# Patient Record
Sex: Male | Born: 1950 | Race: White | Hispanic: No | Smoking: Current some day smoker
Health system: Southern US, Community
[De-identification: ages and names within clinical notes are randomized; demographics above are authoritative.]

## PROBLEM LIST (undated history)

## (undated) DIAGNOSIS — K7460 Unspecified cirrhosis of liver: Secondary | ICD-10-CM

## (undated) DIAGNOSIS — I10 Essential (primary) hypertension: Secondary | ICD-10-CM

## (undated) DIAGNOSIS — N289 Disorder of kidney and ureter, unspecified: Secondary | ICD-10-CM

## (undated) DIAGNOSIS — L409 Psoriasis, unspecified: Secondary | ICD-10-CM

## (undated) HISTORY — PX: HERNIA REPAIR: SHX51

## (undated) HISTORY — PX: UMBILICAL HERNIA REPAIR: SHX196

## (undated) HISTORY — PX: PARACENTESIS: SHX844

---

## 2006-12-14 ENCOUNTER — Inpatient Hospital Stay (HOSPITAL_COMMUNITY): Admission: EM | Admit: 2006-12-14 | Discharge: 2006-12-18 | Payer: Self-pay | Admitting: Emergency Medicine

## 2016-02-28 ENCOUNTER — Emergency Department (HOSPITAL_COMMUNITY): Payer: Non-veteran care

## 2016-02-28 ENCOUNTER — Inpatient Hospital Stay (HOSPITAL_COMMUNITY): Payer: Non-veteran care

## 2016-02-28 ENCOUNTER — Inpatient Hospital Stay (HOSPITAL_COMMUNITY)
Admission: EM | Admit: 2016-02-28 | Discharge: 2016-03-03 | DRG: 552 | Disposition: A | Discharge status: Home / Self Care | Payer: Non-veteran care | Attending: General Surgery | Admitting: General Surgery

## 2016-02-28 ENCOUNTER — Encounter (HOSPITAL_COMMUNITY): Payer: Self-pay | Admitting: Radiology

## 2016-02-28 DIAGNOSIS — Y908 Blood alcohol level of 240 mg/100 ml or more: Secondary | ICD-10-CM | POA: Diagnosis present

## 2016-02-28 DIAGNOSIS — Y92410 Unspecified street and highway as the place of occurrence of the external cause: Secondary | ICD-10-CM

## 2016-02-28 DIAGNOSIS — F10229 Alcohol dependence with intoxication, unspecified: Secondary | ICD-10-CM | POA: Diagnosis present

## 2016-02-28 DIAGNOSIS — S0282XA Fracture of other specified skull and facial bones, left side, initial encounter for closed fracture: Secondary | ICD-10-CM | POA: Diagnosis present

## 2016-02-28 DIAGNOSIS — I10 Essential (primary) hypertension: Secondary | ICD-10-CM | POA: Diagnosis present

## 2016-02-28 DIAGNOSIS — S12600A Unspecified displaced fracture of seventh cervical vertebra, initial encounter for closed fracture: Secondary | ICD-10-CM | POA: Diagnosis present

## 2016-02-28 DIAGNOSIS — F1010 Alcohol abuse, uncomplicated: Secondary | ICD-10-CM | POA: Diagnosis present

## 2016-02-28 DIAGNOSIS — S42192A Fracture of other part of scapula, left shoulder, initial encounter for closed fracture: Secondary | ICD-10-CM | POA: Diagnosis present

## 2016-02-28 DIAGNOSIS — S129XXA Fracture of neck, unspecified, initial encounter: Secondary | ICD-10-CM | POA: Diagnosis present

## 2016-02-28 DIAGNOSIS — E119 Type 2 diabetes mellitus without complications: Secondary | ICD-10-CM | POA: Diagnosis present

## 2016-02-28 DIAGNOSIS — S42102A Fracture of unspecified part of scapula, left shoulder, initial encounter for closed fracture: Secondary | ICD-10-CM | POA: Diagnosis present

## 2016-02-28 DIAGNOSIS — K766 Portal hypertension: Secondary | ICD-10-CM | POA: Diagnosis present

## 2016-02-28 DIAGNOSIS — K7030 Alcoholic cirrhosis of liver without ascites: Secondary | ICD-10-CM | POA: Diagnosis present

## 2016-02-28 DIAGNOSIS — S0292XA Unspecified fracture of facial bones, initial encounter for closed fracture: Secondary | ICD-10-CM | POA: Diagnosis present

## 2016-02-28 DIAGNOSIS — S0181XA Laceration without foreign body of other part of head, initial encounter: Secondary | ICD-10-CM | POA: Diagnosis present

## 2016-02-28 DIAGNOSIS — Z720 Tobacco use: Secondary | ICD-10-CM

## 2016-02-28 DIAGNOSIS — M542 Cervicalgia: Secondary | ICD-10-CM | POA: Diagnosis present

## 2016-02-28 DIAGNOSIS — T07XXXA Unspecified multiple injuries, initial encounter: Secondary | ICD-10-CM

## 2016-02-28 DIAGNOSIS — K7460 Unspecified cirrhosis of liver: Secondary | ICD-10-CM | POA: Diagnosis present

## 2016-02-28 DIAGNOSIS — S01112A Laceration without foreign body of left eyelid and periocular area, initial encounter: Secondary | ICD-10-CM | POA: Diagnosis present

## 2016-02-28 DIAGNOSIS — S2220XA Unspecified fracture of sternum, initial encounter for closed fracture: Secondary | ICD-10-CM | POA: Diagnosis present

## 2016-02-28 LAB — CBC
HEMATOCRIT: 33.2 % — AB (ref 39.0–52.0)
HEMOGLOBIN: 11.2 g/dL — AB (ref 13.0–17.0)
MCH: 34.5 pg — ABNORMAL HIGH (ref 26.0–34.0)
MCHC: 33.7 g/dL (ref 30.0–36.0)
MCV: 102.2 fL — ABNORMAL HIGH (ref 78.0–100.0)
Platelets: 167 10*3/uL (ref 150–400)
RBC: 3.25 MIL/uL — AB (ref 4.22–5.81)
RDW: 15 % (ref 11.5–15.5)
WBC: 9.1 10*3/uL (ref 4.0–10.5)

## 2016-02-28 LAB — COMPREHENSIVE METABOLIC PANEL
ALK PHOS: 112 U/L (ref 38–126)
ALT: 29 U/L (ref 17–63)
AST: 63 U/L — AB (ref 15–41)
Albumin: 2.7 g/dL — ABNORMAL LOW (ref 3.5–5.0)
Anion gap: 10 (ref 5–15)
CALCIUM: 8.3 mg/dL — AB (ref 8.9–10.3)
CHLORIDE: 109 mmol/L (ref 101–111)
CO2: 23 mmol/L (ref 22–32)
CREATININE: 0.81 mg/dL (ref 0.61–1.24)
Glucose, Bld: 177 mg/dL — ABNORMAL HIGH (ref 65–99)
Potassium: 3.9 mmol/L (ref 3.5–5.1)
Sodium: 142 mmol/L (ref 135–145)
Total Bilirubin: 2.1 mg/dL — ABNORMAL HIGH (ref 0.3–1.2)
Total Protein: 7.2 g/dL (ref 6.5–8.1)

## 2016-02-28 LAB — CBG MONITORING, ED: GLUCOSE-CAPILLARY: 149 mg/dL — AB (ref 65–99)

## 2016-02-28 LAB — CDS SEROLOGY

## 2016-02-28 LAB — ETHANOL: Alcohol, Ethyl (B): 272 mg/dL — ABNORMAL HIGH (ref <5)

## 2016-02-28 LAB — PROTIME-INR
INR: 1.5 — AB (ref 0.00–1.49)
PROTHROMBIN TIME: 18.2 s — AB (ref 11.6–15.2)

## 2016-02-28 LAB — SAMPLE TO BLOOD BANK

## 2016-02-28 MED ORDER — LIDOCAINE-EPINEPHRINE (PF) 2 %-1:200000 IJ SOLN
20.0000 mL | Freq: Once | INTRAMUSCULAR | Status: AC
  Administered 2016-02-28: 20 mL
  Filled 2016-02-28: qty 20

## 2016-02-28 MED ORDER — IOHEXOL 300 MG/ML  SOLN
100.0000 mL | Freq: Once | INTRAMUSCULAR | Status: AC | PRN
  Administered 2016-02-28: 50 mL via INTRAVENOUS

## 2016-02-28 MED ORDER — HYDROMORPHONE HCL 1 MG/ML IJ SOLN
1.0000 mg | Freq: Once | INTRAMUSCULAR | Status: DC
  Filled 2016-02-28: qty 1
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
## 2016-02-28 NOTE — ED Notes (Signed)
Portable chest and c-spine

## 2016-02-28 NOTE — ED Notes (Signed)
To c-t now  C/o pain everwhere.  But is refusing the pain med

## 2016-02-28 NOTE — ED Notes (Signed)
Dr Dwain Sarn[place]field here to see

## 2016-02-28 NOTE — ED Notes (Signed)
Family at the bedside.

## 2016-02-28 NOTE — ED Notes (Addendum)
Dr  Leta Baptistthimmappa here to suture the pt
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
## 2016-02-28 NOTE — ED Notes (Signed)
Pt placed on nasal 02 at 2
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
## 2016-02-28 NOTE — Progress Notes (Signed)
Orthopedic Tech Progress Note Patient Details:  Derrick RossBarry K Oneill 17-Mar-1951 [id number] Ortho visit and assist with orthopedic exam. Patient ID: Derrick RossBarry K Oneill, male   DOB: 17-Mar-1951, 65 y.o.   MRN: [id number]   Lesle ChrisGilliland, Jina Olenick L 02/28/2016, 6:08 PM

## 2016-02-28 NOTE — ED Notes (Signed)
Blood sugar -149

## 2016-02-29 ENCOUNTER — Encounter (HOSPITAL_COMMUNITY): Payer: Self-pay | Admitting: *Deleted

## 2016-02-29 LAB — COMPREHENSIVE METABOLIC PANEL
ALBUMIN: 2.6 g/dL — AB (ref 3.5–5.0)
ALK PHOS: 102 U/L (ref 38–126)
ALT: 32 U/L (ref 17–63)
AST: 59 U/L — AB (ref 15–41)
Anion gap: 9 (ref 5–15)
BILIRUBIN TOTAL: 3.1 mg/dL — AB (ref 0.3–1.2)
CALCIUM: 8.4 mg/dL — AB (ref 8.9–10.3)
CO2: 23 mmol/L (ref 22–32)
CREATININE: 0.63 mg/dL (ref 0.61–1.24)
Chloride: 109 mmol/L (ref 101–111)
GFR calc Af Amer: >60 mL/min (ref >60)
GLUCOSE: 108 mg/dL — AB (ref 65–99)
POTASSIUM: 4.2 mmol/L (ref 3.5–5.1)
Sodium: 141 mmol/L (ref 135–145)
TOTAL PROTEIN: 6.9 g/dL (ref 6.5–8.1)

## 2016-02-29 LAB — GLUCOSE, CAPILLARY
Glucose-Capillary: 105 mg/dL — ABNORMAL HIGH (ref 65–99)
Glucose-Capillary: 107 mg/dL — ABNORMAL HIGH (ref 65–99)

## 2016-02-29 LAB — PROTIME-INR
INR: 1.3 (ref 0.00–1.49)
PROTHROMBIN TIME: 16.3 s — AB (ref 11.6–15.2)

## 2016-02-29 LAB — CBC
HEMATOCRIT: 32.8 % — AB (ref 39.0–52.0)
HEMOGLOBIN: 11 g/dL — AB (ref 13.0–17.0)
MCH: 34 pg (ref 26.0–34.0)
MCHC: 33.5 g/dL (ref 30.0–36.0)
MCV: 101.2 fL — AB (ref 78.0–100.0)
Platelets: 171 10*3/uL (ref 150–400)
RBC: 3.24 MIL/uL — ABNORMAL LOW (ref 4.22–5.81)
RDW: 15.2 % (ref 11.5–15.5)
WBC: 8.8 10*3/uL (ref 4.0–10.5)

## 2016-02-29 LAB — AMMONIA: Ammonia: 60 umol/L — ABNORMAL HIGH (ref 9–35)

## 2016-02-29 LAB — MRSA PCR SCREENING: MRSA by PCR: NEGATIVE

## 2016-02-29 LAB — CBG MONITORING, ED
GLUCOSE-CAPILLARY: 91 mg/dL (ref 65–99)
Glucose-Capillary: 137 mg/dL — ABNORMAL HIGH (ref 65–99)

## 2016-02-29 MED ORDER — SPIRONOLACTONE 25 MG PO TABS
25.0000 mg | ORAL_TABLET | Freq: Every day | ORAL | Status: DC
  Administered 2016-02-29: 25 mg via ORAL
  Filled 2016-02-29: qty 1

## 2016-02-29 MED ORDER — ONDANSETRON HCL 4 MG/2ML IJ SOLN: 4.0000 mg | Freq: Four times a day (QID) | INTRAMUSCULAR | Status: DC | PRN

## 2016-02-29 MED ORDER — ENOXAPARIN SODIUM 30 MG/0.3ML ~~LOC~~ SOLN
30.0000 mg | Freq: Two times a day (BID) | SUBCUTANEOUS | Status: DC
  Administered 2016-02-29 – 2016-03-03 (×6): 30 mg via SUBCUTANEOUS
  Filled 2016-02-29 (×7): qty 0.3

## 2016-02-29 MED ORDER — PANTOPRAZOLE SODIUM 40 MG IV SOLR: 40.0000 mg | Freq: Every day | INTRAVENOUS | Status: DC

## 2016-02-29 MED ORDER — THIAMINE HCL 100 MG/ML IJ SOLN: 100.0000 mg | Freq: Every day | INTRAMUSCULAR | Status: DC

## 2016-02-29 MED ORDER — FUROSEMIDE 40 MG PO TABS
20.0000 mg | ORAL_TABLET | Freq: Every day | ORAL | Status: DC
  Administered 2016-02-29: 20 mg via ORAL
  Filled 2016-02-29: qty 1

## 2016-02-29 MED ORDER — CEFAZOLIN SODIUM 1-5 GM-% IV SOLN
1.0000 g | Freq: Three times a day (TID) | INTRAVENOUS | Status: AC
  Administered 2016-02-29 (×3): 1 g via INTRAVENOUS
  Filled 2016-02-29 (×3): qty 50

## 2016-02-29 MED ORDER — ADULT MULTIVITAMIN W/MINERALS CH
1.0000 | ORAL_TABLET | Freq: Every day | ORAL | Status: DC
  Administered 2016-02-29 – 2016-03-03 (×4): 1 via ORAL
  Filled 2016-02-29 (×4): qty 1

## 2016-02-29 MED ORDER — SODIUM CHLORIDE 0.9 % IV SOLN
INTRAVENOUS | Status: DC
  Administered 2016-02-29: 07:00:00 via INTRAVENOUS
  Administered 2016-03-01: 10 mL/h via INTRAVENOUS

## 2016-02-29 MED ORDER — HYDROMORPHONE HCL 1 MG/ML IJ SOLN
1.0000 mg | Freq: Once | INTRAMUSCULAR | Status: AC
  Administered 2016-02-29: 1 mg via INTRAVENOUS
  Filled 2016-02-29: qty 1

## 2016-02-29 MED ORDER — LORAZEPAM 2 MG/ML IJ SOLN
0.0000 mg | Freq: Four times a day (QID) | INTRAMUSCULAR | Status: AC
  Administered 2016-03-01: 2 mg via INTRAVENOUS
  Filled 2016-02-29: qty 1

## 2016-02-29 MED ORDER — LORAZEPAM 2 MG/ML IJ SOLN: 1.0000 mg | Freq: Four times a day (QID) | INTRAMUSCULAR | Status: DC | PRN

## 2016-02-29 MED ORDER — VITAMIN B-1 100 MG PO TABS
100.0000 mg | ORAL_TABLET | Freq: Every day | ORAL | Status: DC
  Administered 2016-02-29 – 2016-03-03 (×4): 100 mg via ORAL
  Filled 2016-02-29 (×4): qty 1

## 2016-02-29 MED ORDER — ONDANSETRON HCL 4 MG PO TABS: 4.0000 mg | ORAL_TABLET | Freq: Four times a day (QID) | ORAL | Status: DC | PRN

## 2016-02-29 MED ORDER — INSULIN ASPART 100 UNIT/ML ~~LOC~~ SOLN
0.0000 [IU] | Freq: Three times a day (TID) | SUBCUTANEOUS | Status: DC
  Administered 2016-03-01: 2 [IU] via SUBCUTANEOUS
  Administered 2016-03-01 – 2016-03-02 (×2): 1 [IU] via SUBCUTANEOUS

## 2016-02-29 MED ORDER — FOLIC ACID 1 MG PO TABS
1.0000 mg | ORAL_TABLET | Freq: Every day | ORAL | Status: DC
  Administered 2016-02-29 – 2016-03-03 (×4): 1 mg via ORAL
  Filled 2016-02-29 (×4): qty 1

## 2016-02-29 MED ORDER — DEXTROSE 5 % IV SOLN
500.0000 mg | Freq: Three times a day (TID) | INTRAVENOUS | Status: DC | PRN
  Administered 2016-02-29: 500 mg via INTRAVENOUS
  Filled 2016-02-29 (×3): qty 5

## 2016-02-29 MED ORDER — LORAZEPAM 1 MG PO TABS: 1.0000 mg | ORAL_TABLET | Freq: Four times a day (QID) | ORAL | Status: DC | PRN

## 2016-02-29 MED ORDER — BISACODYL 10 MG RE SUPP: 10.0000 mg | Freq: Every day | RECTAL | Status: DC | PRN

## 2016-02-29 MED ORDER — HYDROMORPHONE HCL 1 MG/ML IJ SOLN
1.0000 mg | INTRAMUSCULAR | Status: DC | PRN
  Administered 2016-02-29 (×4): 1 mg via INTRAVENOUS
  Filled 2016-02-29 (×5): qty 1

## 2016-02-29 MED ORDER — PANTOPRAZOLE SODIUM 40 MG PO TBEC
40.0000 mg | DELAYED_RELEASE_TABLET | Freq: Every day | ORAL | Status: DC
  Administered 2016-02-29 – 2016-03-02 (×3): 40 mg via ORAL
  Filled 2016-02-29 (×3): qty 1

## 2016-02-29 MED ORDER — LACTULOSE 10 GM/15ML PO SOLN
20.0000 g | Freq: Two times a day (BID) | ORAL | Status: DC | PRN
  Administered 2016-02-29: 20 g via ORAL
  Filled 2016-02-29: qty 30

## 2016-02-29 MED ORDER — LORAZEPAM 2 MG/ML IJ SOLN: 0.0000 mg | Freq: Two times a day (BID) | INTRAMUSCULAR | Status: DC

## 2016-02-29 NOTE — ED Notes (Signed)
The pt woke back up shed is c/o  His head hurting again

## 2016-02-29 NOTE — ED Notes (Addendum)
Dr Silverio Layyao called the neuro doctor unknown name.  Doctor did not come to see the pt

## 2016-02-29 NOTE — ED Notes (Signed)
THE PTS FAMILY HAS TALKED THE PT INTO TAKING SOME PAIN MED

## 2016-02-29 NOTE — ED Notes (Addendum)
Dr Dwain Sarn[place]akefield paged
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
## 2016-02-29 NOTE — ED Notes (Signed)
Family has gone home  Pt c/o a severe headache  Med givedn

## 2016-02-29 NOTE — ED Notes (Signed)
C/o headache still  Med givn  Aspen collar replacing the other collar.  Moved into a regular hosp bed for comfort.  Pt c/o lt shoulder and arm pain also

## 2016-02-29 NOTE — ED Notes (Signed)
P[ain med given .  Nasal 02 still in place  sTS ,LOW MOMENTARILY FROM THE DILAIDID
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
## 2016-02-29 NOTE — Progress Notes (Addendum)
Left message with [facility]  to notify hospital of admission.  Will await callback.    Quintella BatonJulie W. Sulo Janczak, RN, BSN  Trauma/Neuro ICU Case Manager [phone]

## 2016-02-29 NOTE — ED Notes (Addendum)
Dr Dwain Sarnawakefield saw the pt

## 2016-02-29 NOTE — ED Notes (Signed)
Pt awakened from sleep c/o a dry mouth.  No pain unless  He moves

## 2016-02-29 NOTE — ED Notes (Addendum)
ent called dr Leta Baptistthimmappa
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
## 2016-03-01 LAB — COMPREHENSIVE METABOLIC PANEL
ALK PHOS: 100 U/L (ref 38–126)
ALT: 29 U/L (ref 17–63)
ANION GAP: 7 (ref 5–15)
AST: 59 U/L — ABNORMAL HIGH (ref 15–41)
Albumin: 2.7 g/dL — ABNORMAL LOW (ref 3.5–5.0)
BUN: 5 mg/dL — ABNORMAL LOW (ref 6–20)
CALCIUM: 8.4 mg/dL — AB (ref 8.9–10.3)
CO2: 26 mmol/L (ref 22–32)
Chloride: 102 mmol/L (ref 101–111)
Creatinine, Ser: 0.59 mg/dL — ABNORMAL LOW (ref 0.61–1.24)
Glucose, Bld: 93 mg/dL (ref 65–99)
Potassium: 4 mmol/L (ref 3.5–5.1)
Sodium: 135 mmol/L (ref 135–145)
TOTAL PROTEIN: 7.4 g/dL (ref 6.5–8.1)
Total Bilirubin: 6.4 mg/dL — ABNORMAL HIGH (ref 0.3–1.2)

## 2016-03-01 LAB — CBC
HCT: 34.4 % — ABNORMAL LOW (ref 39.0–52.0)
Hemoglobin: 11.4 g/dL — ABNORMAL LOW (ref 13.0–17.0)
MCH: 34.4 pg — ABNORMAL HIGH (ref 26.0–34.0)
MCHC: 33.1 g/dL (ref 30.0–36.0)
MCV: 103.9 fL — AB (ref 78.0–100.0)
PLATELETS: 125 10*3/uL — AB (ref 150–400)
RBC: 3.31 MIL/uL — AB (ref 4.22–5.81)
RDW: 15.4 % (ref 11.5–15.5)
WBC: 7.5 10*3/uL (ref 4.0–10.5)

## 2016-03-01 LAB — HEMOGLOBIN A1C
HEMOGLOBIN A1C: 5.4 % (ref 4.8–5.6)
Mean Plasma Glucose: 108 mg/dL

## 2016-03-01 LAB — GLUCOSE, CAPILLARY
GLUCOSE-CAPILLARY: 92 mg/dL (ref 65–99)
Glucose-Capillary: 146 mg/dL — ABNORMAL HIGH (ref 65–99)
Glucose-Capillary: 155 mg/dL — ABNORMAL HIGH (ref 65–99)
Glucose-Capillary: 156 mg/dL — ABNORMAL HIGH (ref 65–99)

## 2016-03-01 LAB — AMMONIA: Ammonia: 105 umol/L — ABNORMAL HIGH (ref 9–35)

## 2016-03-01 MED ORDER — HYDROMORPHONE HCL 1 MG/ML IJ SOLN
1.0000 mg | INTRAMUSCULAR | Status: DC | PRN
  Administered 2016-03-01 – 2016-03-02 (×5): 1 mg via INTRAVENOUS
  Filled 2016-03-01 (×5): qty 1

## 2016-03-01 MED ORDER — FUROSEMIDE 40 MG PO TABS
40.0000 mg | ORAL_TABLET | Freq: Every day | ORAL | Status: DC
  Administered 2016-03-01 – 2016-03-03 (×3): 40 mg via ORAL
  Filled 2016-03-01 (×3): qty 1

## 2016-03-01 MED ORDER — LACTULOSE 10 GM/15ML PO SOLN
20.0000 g | Freq: Two times a day (BID) | ORAL | Status: DC
  Administered 2016-03-01 – 2016-03-03 (×5): 20 g via ORAL
  Filled 2016-03-01 (×5): qty 30

## 2016-03-01 MED ORDER — OXYCODONE HCL 5 MG PO TABS
5.0000 mg | ORAL_TABLET | ORAL | Status: DC | PRN
  Administered 2016-03-02: 5 mg via ORAL
  Administered 2016-03-02: 15 mg via ORAL
  Filled 2016-03-01: qty 1
  Filled 2016-03-01: qty 3
  Filled 2016-03-01: qty 2

## 2016-03-01 MED ORDER — SPIRONOLACTONE 100 MG PO TABS
100.0000 mg | ORAL_TABLET | Freq: Every day | ORAL | Status: DC
  Administered 2016-03-01 – 2016-03-03 (×3): 100 mg via ORAL
  Filled 2016-03-01 (×2): qty 4
  Filled 2016-03-01: qty 1
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
## 2016-03-01 NOTE — Progress Notes (Addendum)
[place] [facility].  She states pt is not service connected, which is equivalent to Self Pay.  [facility] for PCP, and receives his meds for free, based on income exemption.  She states that if SNF is needed for pt, they will assist with placement in a VA facility, but VA members with years of service connection will be considered as a priority.    Faxed demographics and H&P to Transfer Coordinator, per her request. [phone]  [facility]: phone: [phone]                            Fax: [phone]  Quintella BatonJulie W. Terri Rorrer, RN, BSN  Trauma/Neuro ICU Case Manager [phone]
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
## 2016-03-01 NOTE — Progress Notes (Signed)
Inpatient Rehabilitation  We received request from therapies to screen pt. for possible IP Rehab.  Unfortunately pt's VA benefits will not cover for an IP Rehab admission.  SNF is covered under VA .  We are not recommending na IP Rehab consult.  Please call if questions.  Weldon PickingSusan Julius Matus PT Inpatient Rehab Admissions Coordinator Cell [phone] Office [phone]
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
## 2016-03-02 LAB — GLUCOSE, CAPILLARY
GLUCOSE-CAPILLARY: 108 mg/dL — AB (ref 65–99)
GLUCOSE-CAPILLARY: 136 mg/dL — AB (ref 65–99)
GLUCOSE-CAPILLARY: 137 mg/dL — AB (ref 65–99)
Glucose-Capillary: 106 mg/dL — ABNORMAL HIGH (ref 65–99)

## 2016-03-02 LAB — BASIC METABOLIC PANEL
Anion gap: 7 (ref 5–15)
CALCIUM: 8.2 mg/dL — AB (ref 8.9–10.3)
CHLORIDE: 97 mmol/L — AB (ref 101–111)
CO2: 27 mmol/L (ref 22–32)
CREATININE: 0.61 mg/dL (ref 0.61–1.24)
Glucose, Bld: 154 mg/dL — ABNORMAL HIGH (ref 65–99)
Potassium: 3.3 mmol/L — ABNORMAL LOW (ref 3.5–5.1)
SODIUM: 131 mmol/L — AB (ref 135–145)

## 2016-03-02 MED ORDER — SODIUM CHLORIDE 1 G PO TABS
1.0000 g | ORAL_TABLET | Freq: Two times a day (BID) | ORAL | Status: DC
  Administered 2016-03-02 – 2016-03-03 (×3): 1 g via ORAL
  Filled 2016-03-02 (×4): qty 1

## 2016-03-02 MED ORDER — POTASSIUM CHLORIDE CRYS ER 20 MEQ PO TBCR
20.0000 meq | EXTENDED_RELEASE_TABLET | Freq: Two times a day (BID) | ORAL | Status: AC
  Administered 2016-03-02 (×2): 20 meq via ORAL
  Filled 2016-03-02 (×2): qty 1
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
## 2016-03-03 DIAGNOSIS — S42102A Fracture of unspecified part of scapula, left shoulder, initial encounter for closed fracture: Secondary | ICD-10-CM | POA: Diagnosis present

## 2016-03-03 DIAGNOSIS — S0181XA Laceration without foreign body of other part of head, initial encounter: Secondary | ICD-10-CM | POA: Diagnosis present

## 2016-03-03 DIAGNOSIS — S0292XA Unspecified fracture of facial bones, initial encounter for closed fracture: Secondary | ICD-10-CM | POA: Diagnosis present

## 2016-03-03 DIAGNOSIS — K7460 Unspecified cirrhosis of liver: Secondary | ICD-10-CM | POA: Diagnosis present

## 2016-03-03 DIAGNOSIS — S2220XA Unspecified fracture of sternum, initial encounter for closed fracture: Secondary | ICD-10-CM | POA: Diagnosis present

## 2016-03-03 DIAGNOSIS — S129XXA Fracture of neck, unspecified, initial encounter: Secondary | ICD-10-CM | POA: Diagnosis present

## 2016-03-03 DIAGNOSIS — T07XXXA Unspecified multiple injuries, initial encounter: Secondary | ICD-10-CM

## 2016-03-03 DIAGNOSIS — F1010 Alcohol abuse, uncomplicated: Secondary | ICD-10-CM | POA: Diagnosis present

## 2016-03-03 LAB — BASIC METABOLIC PANEL
ANION GAP: 6 (ref 5–15)
BUN: <5 mg/dL — ABNORMAL LOW (ref 6–20)
CALCIUM: 8.6 mg/dL — AB (ref 8.9–10.3)
CHLORIDE: 101 mmol/L (ref 101–111)
CO2: 28 mmol/L (ref 22–32)
CREATININE: 0.62 mg/dL (ref 0.61–1.24)
GFR calc non Af Amer: >60 mL/min (ref >60)
Glucose, Bld: 131 mg/dL — ABNORMAL HIGH (ref 65–99)
Potassium: 3.7 mmol/L (ref 3.5–5.1)
SODIUM: 135 mmol/L (ref 135–145)

## 2016-03-03 LAB — GLUCOSE, CAPILLARY: GLUCOSE-CAPILLARY: 98 mg/dL (ref 65–99)
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
## 2016-03-03 NOTE — Progress Notes (Signed)
Patient discharged to home with instructions. 
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
## 2016-03-03 NOTE — Clinical Social Work Note (Signed)
Clinical Social Worker continuing to follow patient and family for support and discharge planning needs.  Patient has progressed well with therapies and will be returning home today.  CM aware and has made home health arrangements.  Clinical Social Worker will sign off for now as social work intervention is no longer needed. Please consult us again if new need arises.  Macario GoldsJesse Dantavious Snowball, [place] [phone]
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
## 2016-03-03 NOTE — Discharge Instructions (Signed)
Increase activity as pain allows. Keep arm in sling.  Wash wounds daily in shower with soap and water. Do not soak. Apply antibiotic ointment (e.g. Neosporin) twice daily and as needed to keep moist.

## 2016-03-03 NOTE — Care Management Note (Signed)
Case Management Note  Patient Details  Name: Derrick Oneill MRN: [id number] Date of Birth: 1951-02-02  Subjective/Objective:  Pt medically stable for dc home today.  Pt states his sisters can assist at dc.  He has all needed DME at home.  HHPT/OT recommended by therapists.                    Action/Plan: [employer] Called [facility] and spoke with Dr. Jacquelyne BalintAjo's nurse, Victorino DikeJennifer.  She states that MD will have to sign off on [facility] and [facility].  Faxed Demographics, orders, H&P, and dc summary to [place] at [phone].  Pt made aware that [facility]  He is agreeable to plan.    Expected Discharge Date: 03/03/16        Expected Discharge Plan:  Home w Home Health Services  In-House Referral:  Clinical Social Work  Discharge planning Services  CM Consult  Post Acute Care Choice:    Choice offered to:     DME Arranged:    DME Agency:     HH Arranged:   PT/OT HH Agency:   other, see note  Status of Service:  Completed, signed off  Medicare Important Message Given:    Date Medicare IM Given:    Medicare IM give by:    Date Additional Medicare IM Given:    Additional Medicare Important Message give by:     If discussed at Long Length of Stay Meetings, dates discussed:    Additional Comments:  Quintella BatonJulie W. Antero Derosia, RN, BSN  Trauma/Neuro ICU Case Manager [phone]
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
## 2018-05-29 ENCOUNTER — Inpatient Hospital Stay (HOSPITAL_COMMUNITY)
Admission: EM | Admit: 2018-05-29 | Discharge: 2018-06-01 | DRG: 433 | Disposition: A | Discharge status: Home Health | Payer: Non-veteran care | Attending: Family Medicine | Admitting: Family Medicine

## 2018-05-29 ENCOUNTER — Encounter (HOSPITAL_COMMUNITY): Payer: Self-pay | Admitting: Emergency Medicine

## 2018-05-29 ENCOUNTER — Emergency Department (HOSPITAL_COMMUNITY): Payer: Non-veteran care

## 2018-05-29 ENCOUNTER — Other Ambulatory Visit: Payer: Self-pay

## 2018-05-29 DIAGNOSIS — Z87891 Personal history of nicotine dependence: Secondary | ICD-10-CM

## 2018-05-29 DIAGNOSIS — R600 Localized edema: Secondary | ICD-10-CM | POA: Diagnosis present

## 2018-05-29 DIAGNOSIS — I129 Hypertensive chronic kidney disease with stage 1 through stage 4 chronic kidney disease, or unspecified chronic kidney disease: Secondary | ICD-10-CM | POA: Diagnosis present

## 2018-05-29 DIAGNOSIS — D638 Anemia in other chronic diseases classified elsewhere: Secondary | ICD-10-CM | POA: Diagnosis present

## 2018-05-29 DIAGNOSIS — L03115 Cellulitis of right lower limb: Secondary | ICD-10-CM | POA: Diagnosis present

## 2018-05-29 DIAGNOSIS — I10 Essential (primary) hypertension: Secondary | ICD-10-CM | POA: Diagnosis present

## 2018-05-29 DIAGNOSIS — R188 Other ascites: Secondary | ICD-10-CM

## 2018-05-29 DIAGNOSIS — Z7682 Awaiting organ transplant status: Secondary | ICD-10-CM

## 2018-05-29 DIAGNOSIS — L03116 Cellulitis of left lower limb: Secondary | ICD-10-CM | POA: Diagnosis present

## 2018-05-29 DIAGNOSIS — K7040 Alcoholic hepatic failure without coma: Secondary | ICD-10-CM | POA: Diagnosis present

## 2018-05-29 DIAGNOSIS — D696 Thrombocytopenia, unspecified: Secondary | ICD-10-CM | POA: Diagnosis present

## 2018-05-29 DIAGNOSIS — R05 Cough: Secondary | ICD-10-CM

## 2018-05-29 DIAGNOSIS — K7460 Unspecified cirrhosis of liver: Secondary | ICD-10-CM | POA: Diagnosis present

## 2018-05-29 DIAGNOSIS — R109 Unspecified abdominal pain: Secondary | ICD-10-CM

## 2018-05-29 DIAGNOSIS — R059 Cough, unspecified: Secondary | ICD-10-CM

## 2018-05-29 DIAGNOSIS — F1010 Alcohol abuse, uncomplicated: Secondary | ICD-10-CM | POA: Diagnosis present

## 2018-05-29 DIAGNOSIS — R601 Generalized edema: Secondary | ICD-10-CM | POA: Diagnosis present

## 2018-05-29 DIAGNOSIS — N181 Chronic kidney disease, stage 1: Secondary | ICD-10-CM | POA: Diagnosis present

## 2018-05-29 DIAGNOSIS — K7031 Alcoholic cirrhosis of liver with ascites: Secondary | ICD-10-CM | POA: Diagnosis not present

## 2018-05-29 DIAGNOSIS — N289 Disorder of kidney and ureter, unspecified: Secondary | ICD-10-CM

## 2018-05-29 DIAGNOSIS — Z79899 Other long term (current) drug therapy: Secondary | ICD-10-CM

## 2018-05-29 DIAGNOSIS — N179 Acute kidney failure, unspecified: Secondary | ICD-10-CM | POA: Diagnosis present

## 2018-05-29 DIAGNOSIS — K7290 Hepatic failure, unspecified without coma: Secondary | ICD-10-CM | POA: Diagnosis present

## 2018-05-29 DIAGNOSIS — M7989 Other specified soft tissue disorders: Secondary | ICD-10-CM | POA: Diagnosis present

## 2018-05-29 HISTORY — DX: Disorder of kidney and ureter, unspecified: N28.9

## 2018-05-29 HISTORY — DX: Essential (primary) hypertension: I10

## 2018-05-29 HISTORY — DX: Unspecified cirrhosis of liver: K74.60

## 2018-05-29 LAB — AMMONIA: Ammonia: 43 umol/L — ABNORMAL HIGH (ref 9–35)

## 2018-05-29 LAB — CBC WITH DIFFERENTIAL/PLATELET
Basophils Absolute: 0 10*3/uL (ref 0.0–0.1)
Basophils Relative: 0 %
EOS ABS: 0.2 10*3/uL (ref 0.0–0.7)
Eosinophils Relative: 1 %
HEMATOCRIT: 26 % — AB (ref 39.0–52.0)
HEMOGLOBIN: 9.1 g/dL — AB (ref 13.0–17.0)
LYMPHS ABS: 0.9 10*3/uL (ref 0.7–4.0)
LYMPHS PCT: 7 %
MCH: 34.6 pg — ABNORMAL HIGH (ref 26.0–34.0)
MCHC: 35 g/dL (ref 30.0–36.0)
MCV: 98.9 fL (ref 78.0–100.0)
Monocytes Absolute: 1.1 10*3/uL — ABNORMAL HIGH (ref 0.1–1.0)
Monocytes Relative: 8 %
NEUTROS ABS: 10.8 10*3/uL — AB (ref 1.7–7.7)
NEUTROS PCT: 84 %
Platelets: 106 10*3/uL — ABNORMAL LOW (ref 150–400)
RBC: 2.63 MIL/uL — AB (ref 4.22–5.81)
RDW: 15.3 % (ref 11.5–15.5)
WBC: 12.9 10*3/uL — AB (ref 4.0–10.5)

## 2018-05-29 LAB — PROTIME-INR
INR: 1.75
PROTHROMBIN TIME: 20.3 s — AB (ref 11.4–15.2)

## 2018-05-29 LAB — COMPREHENSIVE METABOLIC PANEL
ALT: 30 U/L (ref 0–44)
ANION GAP: 5 (ref 5–15)
AST: 57 U/L — ABNORMAL HIGH (ref 15–41)
Albumin: 2.3 g/dL — ABNORMAL LOW (ref 3.5–5.0)
Alkaline Phosphatase: 76 U/L (ref 38–126)
BUN: 20 mg/dL (ref 8–23)
CHLORIDE: 101 mmol/L (ref 98–111)
CO2: 26 mmol/L (ref 22–32)
Calcium: 8 mg/dL — ABNORMAL LOW (ref 8.9–10.3)
Creatinine, Ser: 1.09 mg/dL (ref 0.61–1.24)
GFR calc non Af Amer: >60 mL/min (ref >60)
Glucose, Bld: 118 mg/dL — ABNORMAL HIGH (ref 70–99)
POTASSIUM: 3.7 mmol/L (ref 3.5–5.1)
SODIUM: 132 mmol/L — AB (ref 135–145)
Total Bilirubin: 2.4 mg/dL — ABNORMAL HIGH (ref 0.3–1.2)
Total Protein: 7 g/dL (ref 6.5–8.1)

## 2018-05-29 MED ORDER — ALBUTEROL SULFATE (2.5 MG/3ML) 0.083% IN NEBU
5.0000 mg | INHALATION_SOLUTION | Freq: Once | RESPIRATORY_TRACT | Status: AC
  Administered 2018-05-29: 5 mg via RESPIRATORY_TRACT
  Filled 2018-05-29: qty 6

## 2018-05-29 NOTE — H&P (Addendum)
History and Physical    PERNELL LENOIR ZOX:[id number] DOB: May 23, 1951 DOA: 05/29/2018  PCP: Patient, No Pcp Per   Patient coming from: Home  Chief Complaint: Dyspnea with tense ascites  HPI: Derrick Oneill is a 67 y.o. male with medical history significant for alcoholic cirrhosis with prior alcohol use, and hypertension who presented to the ED with worsening abdominal distention and lower extremity edema with erythema.  He has become much more shortness of breath on account of his abdominal swelling and is usually seen at the [facility] where he is reportedly on the transplant list.  He has apparently had 3 episodes of large volume paracentesis, each approximately 5 L, over a 1.49-month timeframe.  He states that he cannot lay flat on account of his symptoms and has to sit upright to have less shortness of breath.  He has been taking his home Lasix and spironolactone as prescribed and watching his fluid intake, but does not follow his weights at home. He denies any chest pain, significant abdominal pain, nausea, vomiting, diarrhea, fever, or chills.   ED Course: Vital signs are stable with stable blood pressure readings.  Labs demonstrate leukocytosis of almost 13,000 and hemoglobin of 9.1 (prior 11.4) with platelets of 106.  43 noted.  INR 1.75 and albumin 2.3.  Two-view chest x-ray with some mild bibasilar atelectasis versus some interstitial edema.  2 view abdominal film with no acute abnormalities noted.  Review of Systems: All others reviewed and otherwise negative.  Past Medical History:  Diagnosis Date  . Cirrhosis (HCC)   . Hypertension   . Renal insufficiency 05/29/2018    History reviewed. No pertinent surgical history.   reports that he has quit smoking. He has never used smokeless tobacco. He reports that he drank alcohol. He reports that he does not use drugs.  No Known Allergies  No family history on file.  Prior to Admission medications   Medication Sig Start Date End  Date Taking? Authorizing Provider  cholecalciferol (VITAMIN D) 1000 units tablet Take 1,000 Units by mouth daily.    [provider]  docusate sodium (COLACE) 100 MG capsule Take 100 mg by mouth 2 (two) times daily.    [provider]  folic acid (FOLVITE) 1 MG tablet Take 1 mg by mouth daily.    [provider]  furosemide (LASIX) 40 MG tablet Take 40 mg by mouth daily.    [provider]  lactulose (CHRONULAC) 10 GM/15ML solution Take 20 g by mouth 2 (two) times daily as needed for mild constipation.    [provider]  Melatonin 3 MG TABS Take 1 tablet by mouth at bedtime.    [provider]  Multiple Vitamin (MULTIVITAMIN WITH MINERALS) TABS tablet Take 1 tablet by mouth daily.    [provider]  spironolactone (ALDACTONE) 50 MG tablet Take 100 mg by mouth daily.    [provider]  thiamine 100 MG tablet Take 100 mg by mouth daily.    [provider]    Physical Exam: Vitals:   05/29/18 2200 05/29/18 2203 05/29/18 2230 05/29/18 2300  BP: 109/72 117/76 118/67 119/68  Pulse: 91 91 87 86  Resp: (!) 23 (!) 23 (!) 21 17  Temp:  97.8 F (36.6 C)    TempSrc:  Oral    SpO2: 93% 92% 95% 95%  Weight:      Height:        Constitutional: NAD, calm, comfortable Vitals:   05/29/18 2200  05/29/18 2203 05/29/18 2230 05/29/18 2300  BP: 109/72 117/76 118/67 119/68  Pulse: 91 91 87 86  Resp: (!) 23 (!) 23 (!) 21 17  Temp:  97.8 F (36.6 C)    TempSrc:  Oral    SpO2: 93% 92% 95% 95%  Weight:      Height:       Eyes: lids and conjunctivae normal ENMT: Mucous membranes are moist.  Neck: normal, supple Respiratory: clear to auscultation bilaterally. Normal respiratory effort. No accessory muscle use. On 2L [place]. Cardiovascular: Regular rate and rhythm, no murmurs.  2+ pitting edema bilaterally to lower extremities. Abdomen: Tense ascites with moderate to large distention. Musculoskeletal:  No joint deformity  upper and lower extremities.   Skin: Bilateral lower extremities left greater than right with significant erythema and warmth.  Labs on Admission: I have personally reviewed following labs and imaging studies  CBC: Recent Labs  Lab 05/29/18 2215  WBC 12.9*  NEUTROABS 10.8*  HGB 9.1*  HCT 26.0*  MCV 98.9  PLT 106*   Basic Metabolic Panel: Recent Labs  Lab 05/29/18 2215  NA 132*  K 3.7  CL 101  CO2 26  GLUCOSE 118*  BUN 20  CREATININE 1.09  CALCIUM 8.0*   GFR: Estimated Creatinine Clearance: 77.1 mL/min (by C-G formula based on SCr of 1.09 mg/dL). Liver Function Tests: Recent Labs  Lab 05/29/18 2215  AST 57*  ALT 30  ALKPHOS 76  BILITOT 2.4*  PROT 7.0  ALBUMIN 2.3*   No results for input(s): LIPASE, AMYLASE in the last 168 hours. Recent Labs  Lab 05/29/18 2215  AMMONIA 43*   Coagulation Profile: Recent Labs  Lab 05/29/18 2215  INR 1.75   Cardiac Enzymes: No results for input(s): CKTOTAL, CKMB, CKMBINDEX, TROPONINI in the last 168 hours. BNP (last 3 results) No results for input(s): PROBNP in the last 8760 hours. HbA1C: No results for input(s): HGBA1C in the last 72 hours. CBG: No results for input(s): GLUCAP in the last 168 hours. Lipid Profile: No results for input(s): CHOL, HDL, LDLCALC, TRIG, CHOLHDL, LDLDIRECT in the last 72 hours. Thyroid Function Tests: No results for input(s): TSH, T4TOTAL, FREET4, T3FREE, THYROIDAB in the last 72 hours. Anemia Panel: No results for input(s): VITAMINB12, FOLATE, FERRITIN, TIBC, IRON, RETICCTPCT in the last 72 hours. Urine analysis: No results found for: COLORURINE, APPEARANCEUR, LABSPEC, PHURINE, GLUCOSEU, HGBUR, BILIRUBINUR, KETONESUR, PROTEINUR, UROBILINOGEN, NITRITE, LEUKOCYTESUR  Radiological Exams on Admission: Dg Chest 2 View  Result Date: 05/29/2018 CLINICAL DATA:  67 y/o M; bilateral leg pain and swelling. Shortness of breath. EXAM: CHEST - 2 VIEW COMPARISON:  04/17/2017 chest radiograph.  FINDINGS: Stable cardiac silhouette within normal limits given projection and technique. Low lung volumes accentuate pulmonary markings. Pulmonary vascular congestion and probable mild interstitial edema. Bibasilar platelike atelectasis. No pleural effusion or pneumothorax. No acute osseous abnormality identified should IMPRESSION: Low lung volumes. Minor bibasilar atelectasis and probable mild interstitial edema. Electronically Signed   By: Mitzi Hansen M.D.   On: 05/29/2018 23:00   Dg Abd 2 Views  Result Date: 05/29/2018 CLINICAL DATA:  68 y/o M; bilateral leg and abdominal swelling. History of cirrhosis. EXAM: ABDOMEN - 2 VIEW COMPARISON:  04/28/2018 abdomen radiograph FINDINGS: The bowel gas pattern is normal. There is no evidence of free air. No radio-opaque calculi or other significant radiographic abnormality is seen. Multilevel degenerative changes of the spine. IMPRESSION: Normal bowel gas pattern. Electronically Signed   By: Mitzi Hansen M.D.   On: 05/29/2018 23:03  EKG: Independently reviewed. SR at 91bpm, LVH.  Assessment/Plan Principal Problem:   Ascites Active Problems:   Hepatic cirrhosis (HCC)   Alcohol abuse   Essential hypertension   Renal insufficiency   Anasarca   Cellulitis    1. Recurrent tense ascites with anasarca secondary to alcoholic cirrhosis.  Plan for diuresis with Lasix IV twice daily as well as fluid restriction and monitoring of weights and I's and O's. Currently with stable blood pressure readings-we will not administer albumin for now.  Ultrasound-guided large volume paracentesis to be scheduled for a.m with albumin infusion that time.  Will hold off coverage for possible SBP until fluid is collected, no significant abdominal pain noted at this time aside from discomfort associated with distention.  N.p.o. except medications for now. 2. Bilateral lower extremity moderate,nonpurulent cellulitis left greater than right.  Initiate IV  Ancef.  This is recurrent for the patient. 3. AKI versus progression of CKD.  Creatinine is elevated compared to prior baseline of 0.6 back in 2017.  This needs to be followed up at his usual facility.  Repeat lab work in a.m. 4. Thrombocytopenia. Likely related to above. Follow on repeat CBC.  5. Anemia. No overt bleeding noted; likely due to prior significant EtOH abuse. [facility] 6. Chronic alcoholic cirrhosis.  [facility] and [facility].  Will need follow-up for transplant at that facility. 7. Hypertension.  Plan for aggressive diuresis with IV Lasix twice daily as well as spironolactone.  Monitor carefully. 8. History of prior alcohol abuse.  Denies any current use.  Will maintain on CIWA protocol regardless.   DVT prophylaxis: Heparin Code Status: DNI, but not DNR Family Communication: None at bedside Disposition Plan:Admit for diuresis/paracentesis Consults called:None Admission status: Obs, MedSurg   Susie Ehresman Hoover BrunetteD Tarell Schollmeyer DO [facility] Pager [phone]  If 7PM-7AM, please contact night-coverage [email or web address] Password [email or web address]  05/29/2018, 11:41 PM

## 2018-05-29 NOTE — ED Notes (Signed)
Pt placed on 2L [place] because oxygen saturation was less than 94%

## 2018-05-29 NOTE — ED Triage Notes (Signed)
Pt c/o increased sob and states he has fluid on his abd and bilateral legs.

## 2018-05-29 NOTE — ED Notes (Signed)
Pt abdomen is tight, distended, pt also has swelling in his lower extremities with weeping.   Respiratory at bedside

## 2018-05-29 NOTE — ED Notes (Signed)
Pt transported to xray 
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
## 2018-05-30 ENCOUNTER — Inpatient Hospital Stay (HOSPITAL_COMMUNITY): Payer: Non-veteran care

## 2018-05-30 ENCOUNTER — Encounter (HOSPITAL_COMMUNITY): Payer: Self-pay

## 2018-05-30 DIAGNOSIS — N181 Chronic kidney disease, stage 1: Secondary | ICD-10-CM | POA: Diagnosis present

## 2018-05-30 DIAGNOSIS — K7210 Chronic hepatic failure without coma: Secondary | ICD-10-CM

## 2018-05-30 DIAGNOSIS — K7040 Alcoholic hepatic failure without coma: Secondary | ICD-10-CM | POA: Diagnosis present

## 2018-05-30 DIAGNOSIS — L03119 Cellulitis of unspecified part of limb: Secondary | ICD-10-CM

## 2018-05-30 DIAGNOSIS — N179 Acute kidney failure, unspecified: Secondary | ICD-10-CM | POA: Diagnosis present

## 2018-05-30 DIAGNOSIS — D638 Anemia in other chronic diseases classified elsewhere: Secondary | ICD-10-CM | POA: Diagnosis present

## 2018-05-30 DIAGNOSIS — F1010 Alcohol abuse, uncomplicated: Secondary | ICD-10-CM

## 2018-05-30 DIAGNOSIS — K7290 Hepatic failure, unspecified without coma: Secondary | ICD-10-CM | POA: Diagnosis present

## 2018-05-30 DIAGNOSIS — K7031 Alcoholic cirrhosis of liver with ascites: Secondary | ICD-10-CM | POA: Diagnosis present

## 2018-05-30 DIAGNOSIS — I10 Essential (primary) hypertension: Secondary | ICD-10-CM

## 2018-05-30 DIAGNOSIS — Z7682 Awaiting organ transplant status: Secondary | ICD-10-CM | POA: Diagnosis not present

## 2018-05-30 DIAGNOSIS — Z79899 Other long term (current) drug therapy: Secondary | ICD-10-CM | POA: Diagnosis not present

## 2018-05-30 DIAGNOSIS — R601 Generalized edema: Secondary | ICD-10-CM

## 2018-05-30 DIAGNOSIS — I129 Hypertensive chronic kidney disease with stage 1 through stage 4 chronic kidney disease, or unspecified chronic kidney disease: Secondary | ICD-10-CM | POA: Diagnosis present

## 2018-05-30 DIAGNOSIS — D696 Thrombocytopenia, unspecified: Secondary | ICD-10-CM | POA: Diagnosis present

## 2018-05-30 DIAGNOSIS — R600 Localized edema: Secondary | ICD-10-CM | POA: Diagnosis present

## 2018-05-30 DIAGNOSIS — N289 Disorder of kidney and ureter, unspecified: Secondary | ICD-10-CM

## 2018-05-30 DIAGNOSIS — L03115 Cellulitis of right lower limb: Secondary | ICD-10-CM | POA: Diagnosis present

## 2018-05-30 DIAGNOSIS — M7989 Other specified soft tissue disorders: Secondary | ICD-10-CM | POA: Diagnosis present

## 2018-05-30 DIAGNOSIS — L03116 Cellulitis of left lower limb: Secondary | ICD-10-CM | POA: Diagnosis present

## 2018-05-30 DIAGNOSIS — Z87891 Personal history of nicotine dependence: Secondary | ICD-10-CM | POA: Diagnosis not present

## 2018-05-30 LAB — COMPREHENSIVE METABOLIC PANEL
ALT: 28 U/L (ref 0–44)
ANION GAP: 7 (ref 5–15)
AST: 50 U/L — ABNORMAL HIGH (ref 15–41)
Albumin: 2.3 g/dL — ABNORMAL LOW (ref 3.5–5.0)
Alkaline Phosphatase: 69 U/L (ref 38–126)
BUN: 20 mg/dL (ref 8–23)
CHLORIDE: 100 mmol/L (ref 98–111)
CO2: 26 mmol/L (ref 22–32)
Calcium: 7.9 mg/dL — ABNORMAL LOW (ref 8.9–10.3)
Creatinine, Ser: 1.09 mg/dL (ref 0.61–1.24)
Glucose, Bld: 107 mg/dL — ABNORMAL HIGH (ref 70–99)
POTASSIUM: 3.5 mmol/L (ref 3.5–5.1)
Sodium: 133 mmol/L — ABNORMAL LOW (ref 135–145)
Total Bilirubin: 1.8 mg/dL — ABNORMAL HIGH (ref 0.3–1.2)
Total Protein: 6.8 g/dL (ref 6.5–8.1)

## 2018-05-30 LAB — CBC
HEMATOCRIT: 26 % — AB (ref 39.0–52.0)
HEMOGLOBIN: 8.9 g/dL — AB (ref 13.0–17.0)
MCH: 34 pg (ref 26.0–34.0)
MCHC: 34.2 g/dL (ref 30.0–36.0)
MCV: 99.2 fL (ref 78.0–100.0)
PLATELETS: 109 10*3/uL — AB (ref 150–400)
RBC: 2.62 MIL/uL — AB (ref 4.22–5.81)
RDW: 15.3 % (ref 11.5–15.5)
WBC: 10.1 10*3/uL (ref 4.0–10.5)

## 2018-05-30 LAB — ALBUMIN, PLEURAL OR PERITONEAL FLUID: Albumin, Fluid: <1 g/dL

## 2018-05-30 LAB — BODY FLUID CELL COUNT WITH DIFFERENTIAL
Eos, Fluid: 0 %
Lymphs, Fluid: 13 %
Monocyte-Macrophage-Serous Fluid: 9 % — ABNORMAL LOW (ref 50–90)
Neutrophil Count, Fluid: 78 % — ABNORMAL HIGH (ref 0–25)
Total Nucleated Cell Count, Fluid: 93 cu mm (ref 0–1000)

## 2018-05-30 LAB — PROTEIN, PLEURAL OR PERITONEAL FLUID: Total protein, fluid: <3 g/dL

## 2018-05-30 LAB — GLUCOSE, PLEURAL OR PERITONEAL FLUID: Glucose, Fluid: 104 mg/dL

## 2018-05-30 LAB — GRAM STAIN: Gram Stain: NONE SEEN

## 2018-05-30 LAB — LACTATE DEHYDROGENASE, PLEURAL OR PERITONEAL FLUID: LD FL: 53 U/L — AB (ref 3–23)

## 2018-05-30 MED ORDER — ALBUMIN HUMAN 25 % IV SOLN
25.0000 g | Freq: Once | INTRAVENOUS | Status: AC
  Administered 2018-05-30: 25 g via INTRAVENOUS

## 2018-05-30 MED ORDER — LACTULOSE 10 GM/15ML PO SOLN: 20.0000 g | Freq: Two times a day (BID) | ORAL | Status: DC | PRN

## 2018-05-30 MED ORDER — ALBUMIN HUMAN 25 % IV SOLN
INTRAVENOUS | Status: AC
  Administered 2018-05-30: 25 g via INTRAVENOUS
  Filled 2018-05-30: qty 100

## 2018-05-30 MED ORDER — LACTULOSE 10 GM/15ML PO SOLN
20.0000 g | Freq: Two times a day (BID) | ORAL | Status: DC
  Administered 2018-05-30 – 2018-06-01 (×5): 20 g via ORAL
  Filled 2018-05-30 (×5): qty 30

## 2018-05-30 MED ORDER — ALBUMIN HUMAN 5 % IV SOLN: 25.0000 g | INTRAVENOUS | Status: DC

## 2018-05-30 MED ORDER — IPRATROPIUM-ALBUTEROL 0.5-2.5 (3) MG/3ML IN SOLN: 3.0000 mL | Freq: Four times a day (QID) | RESPIRATORY_TRACT | Status: DC | PRN

## 2018-05-30 MED ORDER — HYDROMORPHONE HCL 1 MG/ML IJ SOLN
0.5000 mg | INTRAMUSCULAR | Status: DC | PRN
  Administered 2018-05-30: 0.5 mg via INTRAVENOUS
  Filled 2018-05-30: qty 0.5

## 2018-05-30 MED ORDER — SODIUM CHLORIDE 0.9% FLUSH: 3.0000 mL | INTRAVENOUS | Status: DC | PRN

## 2018-05-30 MED ORDER — HEPARIN SODIUM (PORCINE) 5000 UNIT/ML IJ SOLN
5000.0000 [IU] | Freq: Three times a day (TID) | INTRAMUSCULAR | Status: DC
  Administered 2018-05-30 – 2018-06-01 (×8): 5000 [IU] via SUBCUTANEOUS
  Filled 2018-05-30 (×8): qty 1

## 2018-05-30 MED ORDER — SODIUM CHLORIDE 0.9 % IV SOLN: 250.0000 mL | INTRAVENOUS | Status: DC | PRN

## 2018-05-30 MED ORDER — CEFAZOLIN SODIUM-DEXTROSE 1-4 GM/50ML-% IV SOLN
1.0000 g | Freq: Three times a day (TID) | INTRAVENOUS | Status: DC
  Administered 2018-05-30 – 2018-06-01 (×8): 1 g via INTRAVENOUS
  Filled 2018-05-30 (×18): qty 50

## 2018-05-30 MED ORDER — VITAMIN B-1 100 MG PO TABS
100.0000 mg | ORAL_TABLET | Freq: Every day | ORAL | Status: DC
  Administered 2018-05-30 – 2018-06-01 (×3): 100 mg via ORAL
  Filled 2018-05-30 (×3): qty 1

## 2018-05-30 MED ORDER — ONDANSETRON HCL 4 MG/2ML IJ SOLN: 4.0000 mg | Freq: Four times a day (QID) | INTRAMUSCULAR | Status: DC | PRN

## 2018-05-30 MED ORDER — SPIRONOLACTONE 100 MG PO TABS
100.0000 mg | ORAL_TABLET | Freq: Every day | ORAL | Status: DC
  Administered 2018-05-30 – 2018-06-01 (×3): 100 mg via ORAL
  Filled 2018-05-30: qty 1
  Filled 2018-05-30: qty 4
  Filled 2018-05-30: qty 1

## 2018-05-30 MED ORDER — FUROSEMIDE 10 MG/ML IJ SOLN
40.0000 mg | Freq: Two times a day (BID) | INTRAMUSCULAR | Status: DC
  Administered 2018-05-30 – 2018-05-31 (×3): 40 mg via INTRAVENOUS
  Filled 2018-05-30 (×3): qty 4

## 2018-05-30 MED ORDER — SODIUM CHLORIDE 0.9% FLUSH
3.0000 mL | Freq: Two times a day (BID) | INTRAVENOUS | Status: DC
  Administered 2018-05-30 – 2018-06-01 (×6): 3 mL via INTRAVENOUS

## 2018-05-30 MED ORDER — ONDANSETRON HCL 4 MG PO TABS: 4.0000 mg | ORAL_TABLET | Freq: Four times a day (QID) | ORAL | Status: DC | PRN

## 2018-05-30 MED ORDER — ALBUMIN HUMAN 5 % IV SOLN: 12.5000 g | Freq: Once | INTRAVENOUS | Status: DC

## 2018-05-30 NOTE — Care Management (Signed)
[facility]. [facility]. [place] [facility]. [facility] and left VM notifying them of pt's admission.
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
## 2018-05-30 NOTE — Progress Notes (Signed)
Paracentesis complete no signs of distress, 4L ascites removed.  

## 2018-05-30 NOTE — Progress Notes (Signed)
PROGRESS NOTE    Derrick Oneill  ZOX:[id number]  DOB: 1950/12/11  DOA: 05/29/2018 PCP: Patient, No Pcp Per   Brief Admission Hx: Derrick Oneill is a 67 y.o. male with medical history significant for alcoholic cirrhosis with prior alcohol use, and hypertension who presented to the ED with worsening abdominal distention and lower extremity edema with erythema.  He has end stage liver disease and reports that he is being treated with [facility].   He has had multiple recent paracentesis procedures at multiple facilities.    MDM/Assessment & Plan:    1. Recurrent tense ascites with anasarca secondary to alcoholic cirrhosis.  Plan for diuresis with Lasix IV twice daily as well as fluid restriction and monitoring of weights and I's and O's. Currently with stable blood pressure readings-we will not administer albumin for now.  Ultrasound-guided large volume paracentesis to be scheduled for a.m with albumin infusion that time.  Will hold off coverage for possible SBP until fluid is collected, no significant abdominal pain noted at this time aside from discomfort associated with distention.  N.p.o. except medications for now.  GI consult requested.  2. Bilateral lower extremity moderate,nonpurulent cellulitis left greater than right.  Continue IV Ancef.  This is recurrent for the patient. 3. AKI versus progression of CKD.  Creatinine is elevated compared to prior baseline of 0.6 back in 2017.   4. Thrombocytopenia. Likely related to chronic liver disease. Follow CBC.  5. Anemia of chronic disease - No overt bleeding noted; likely due to chronic liver disease. [facility]. 6. Chronic alcoholic cirrhosis.  Patient follows [facility] and is on transplant list.  Will need follow-up for transplant at that facility. 7. Hypertension.  Plan for aggressive diuresis with IV Lasix twice daily as well as spironolactone.  Monitor carefully. 8. History of prior alcohol abuse.  Denies any current use.   Will maintain on CIWA protocol regardless.   DVT prophylaxis: Heparin Code Status: DNI, but not DNR Family Communication: None at bedside Disposition Plan:Admit for diuresis/paracentesis  Consultants:  GI  Procedures:  US paracentesis  Subjective: Pt reports abdominal pain persists but overall feeling a little better.    Objective: Vitals:   05/30/18 0000 05/30/18 0030 05/30/18 0119 05/30/18 0601  BP: 113/76 114/73 114/78 103/65  Pulse: 84 86 90 84  Resp: 18 (!) 22 19 19   Temp:   98.5 F (36.9 C) 98.2 F (36.8 C)  TempSrc:   Oral Oral  SpO2: 97% 95% 97% 91%  Weight:   91 kg (200 lb 9.6 oz) 90 kg (198 lb 8 oz)  Height:   5\' 7"  (1.702 m)     Intake/Output Summary (Last 24 hours) at 05/30/2018 1010 Last data filed at 05/30/2018 0600 Gross per 24 hour  Intake 58.33 ml  Output 925 ml  Net -866.67 ml   Filed Weights   05/29/18 2151 05/30/18 0119 05/30/18 0601  Weight: 93 kg (205 lb) 91 kg (200 lb 9.6 oz) 90 kg (198 lb 8 oz)   REVIEW OF SYSTEMS  As per history otherwise all reviewed and reported negative  Exam:  General exam: chronically ill appearing, older than stated age.  Respiratory system: shallow breathing.  No increased work of breathing. Cardiovascular system: S1 & S2 heard, RRR.  Gastrointestinal system: Abdomen is nondistended, soft and nontender. Normal bowel sounds heard. Central nervous system: Alert and oriented. No focal neurological deficits. Extremities: 2+ pitting edema bilateral with erythema noted BLE consistent with cellulitis  infection.  Data Reviewed: Basic Metabolic Panel: Recent Labs  Lab 05/29/18 2215 05/30/18 0554  NA 132* 133*  K 3.7 3.5  CL 101 100  CO2 26 26  GLUCOSE 118* 107*  BUN 20 20  CREATININE 1.09 1.09  CALCIUM 8.0* 7.9*   Liver Function Tests: Recent Labs  Lab 05/29/18 2215 05/30/18 0554  AST 57* 50*  ALT 30 28  ALKPHOS 76 69  BILITOT 2.4* 1.8*  PROT 7.0 6.8  ALBUMIN 2.3* 2.3*   No results for  input(s): LIPASE, AMYLASE in the last 168 hours. Recent Labs  Lab 05/29/18 2215  AMMONIA 43*   CBC: Recent Labs  Lab 05/29/18 2215 05/30/18 0554  WBC 12.9* 10.1  NEUTROABS 10.8*  --   HGB 9.1* 8.9*  HCT 26.0* 26.0*  MCV 98.9 99.2  PLT 106* 109*   Cardiac Enzymes: No results for input(s): CKTOTAL, CKMB, CKMBINDEX, TROPONINI in the last 168 hours. CBG (last 3)  No results for input(s): GLUCAP in the last 72 hours. No results found for this or any previous visit (from the past 240 hour(s)).   Studies: Dg Chest 2 View  Result Date: 05/29/2018 CLINICAL DATA:  67 y/o M; bilateral leg pain and swelling. Shortness of breath. EXAM: CHEST - 2 VIEW COMPARISON:  04/17/2017 chest radiograph. FINDINGS: Stable cardiac silhouette within normal limits given projection and technique. Low lung volumes accentuate pulmonary markings. Pulmonary vascular congestion and probable mild interstitial edema. Bibasilar platelike atelectasis. No pleural effusion or pneumothorax. No acute osseous abnormality identified should IMPRESSION: Low lung volumes. Minor bibasilar atelectasis and probable mild interstitial edema. Electronically Signed   By: Mitzi Hansen M.D.   On: 05/29/2018 23:00   Dg Abd 2 Views  Result Date: 05/29/2018 CLINICAL DATA:  67 y/o M; bilateral leg and abdominal swelling. History of cirrhosis. EXAM: ABDOMEN - 2 VIEW COMPARISON:  04/28/2018 abdomen radiograph FINDINGS: The bowel gas pattern is normal. There is no evidence of free air. No radio-opaque calculi or other significant radiographic abnormality is seen. Multilevel degenerative changes of the spine. IMPRESSION: Normal bowel gas pattern. Electronically Signed   By: Mitzi Hansen M.D.   On: 05/29/2018 23:03     Scheduled Meds: . furosemide  40 mg Intravenous Q12H  . heparin  5,000 Units Subcutaneous Q8H  . lactulose  20 g Oral BID  . sodium chloride flush  3 mL Intravenous Q12H  . spironolactone  100 mg Oral  Daily  . thiamine  100 mg Oral Daily   Continuous Infusions: . sodium chloride    . albumin human    .  ceFAZolin (ANCEF) IV Stopped (05/30/18 1000)    Principal Problem:   Ascites Active Problems:   Hepatic cirrhosis (HCC)   Alcohol abuse   Essential hypertension   Renal insufficiency   Anasarca   Cellulitis   Liver failure (HCC)  Time spent:   Standley Dakins, MD, FAAFP [facility] Pager [phone]  If 7PM-7AM, please contact night-coverage [email or web address] Password [email or web address] 05/30/2018, 10:10 AM    LOS: 0 days

## 2018-05-30 NOTE — Procedures (Signed)
Uncomplicated US guided paracentesis via RLQ.  Some skin leakage after catheter removal treated with occlusive dressing.  4L drained.    JWatts MD

## 2018-05-31 ENCOUNTER — Inpatient Hospital Stay (HOSPITAL_COMMUNITY): Payer: Non-veteran care

## 2018-05-31 DIAGNOSIS — R601 Generalized edema: Secondary | ICD-10-CM

## 2018-05-31 DIAGNOSIS — K7031 Alcoholic cirrhosis of liver with ascites: Principal | ICD-10-CM

## 2018-05-31 LAB — CBC WITH DIFFERENTIAL/PLATELET
BASOS ABS: 0 10*3/uL (ref 0.0–0.1)
Basophils Relative: 0 %
EOS PCT: 2 %
Eosinophils Absolute: 0.1 10*3/uL (ref 0.0–0.7)
HCT: 26.3 % — ABNORMAL LOW (ref 39.0–52.0)
Hemoglobin: 9 g/dL — ABNORMAL LOW (ref 13.0–17.0)
LYMPHS ABS: 0.8 10*3/uL (ref 0.7–4.0)
Lymphocytes Relative: 12 %
MCH: 34 pg (ref 26.0–34.0)
MCHC: 34.2 g/dL (ref 30.0–36.0)
MCV: 99.2 fL (ref 78.0–100.0)
Monocytes Absolute: 0.6 10*3/uL (ref 0.1–1.0)
Monocytes Relative: 10 %
NEUTROS ABS: 5.2 10*3/uL (ref 1.7–7.7)
Neutrophils Relative %: 76 %
PLATELETS: 123 10*3/uL — AB (ref 150–400)
RBC: 2.65 MIL/uL — AB (ref 4.22–5.81)
RDW: 15.3 % (ref 11.5–15.5)
WBC: 6.8 10*3/uL (ref 4.0–10.5)

## 2018-05-31 LAB — COMPREHENSIVE METABOLIC PANEL
ALBUMIN: 2.5 g/dL — AB (ref 3.5–5.0)
ALT: 26 U/L (ref 0–44)
ANION GAP: 10 (ref 5–15)
AST: 44 U/L — AB (ref 15–41)
Alkaline Phosphatase: 67 U/L (ref 38–126)
BUN: 18 mg/dL (ref 8–23)
CHLORIDE: 96 mmol/L — AB (ref 98–111)
CO2: 26 mmol/L (ref 22–32)
Calcium: 8.1 mg/dL — ABNORMAL LOW (ref 8.9–10.3)
Creatinine, Ser: 1.02 mg/dL (ref 0.61–1.24)
GFR calc Af Amer: >60 mL/min (ref >60)
GLUCOSE: 105 mg/dL — AB (ref 70–99)
Potassium: 3.6 mmol/L (ref 3.5–5.1)
Sodium: 132 mmol/L — ABNORMAL LOW (ref 135–145)
TOTAL PROTEIN: 6.9 g/dL (ref 6.5–8.1)
Total Bilirubin: 2.2 mg/dL — ABNORMAL HIGH (ref 0.3–1.2)

## 2018-05-31 LAB — AMMONIA: AMMONIA: 31 umol/L (ref 9–35)

## 2018-05-31 LAB — HIV ANTIBODY (ROUTINE TESTING W REFLEX): HIV Screen 4th Generation wRfx: NONREACTIVE

## 2018-05-31 MED ORDER — FOLIC ACID 1 MG PO TABS
1.0000 mg | ORAL_TABLET | Freq: Every day | ORAL | Status: DC
  Administered 2018-05-31 – 2018-06-01 (×2): 1 mg via ORAL
  Filled 2018-05-31 (×2): qty 1

## 2018-05-31 MED ORDER — FUROSEMIDE 10 MG/ML IJ SOLN
60.0000 mg | Freq: Two times a day (BID) | INTRAMUSCULAR | Status: DC
  Administered 2018-05-31 – 2018-06-01 (×3): 60 mg via INTRAVENOUS
  Filled 2018-05-31 (×3): qty 6

## 2018-05-31 MED ORDER — ADULT MULTIVITAMIN W/MINERALS CH
1.0000 | ORAL_TABLET | Freq: Every day | ORAL | Status: DC
  Administered 2018-05-31 – 2018-06-01 (×2): 1 via ORAL
  Filled 2018-05-31 (×2): qty 1

## 2018-05-31 MED ORDER — HYDROMORPHONE HCL 1 MG/ML IJ SOLN: 0.5000 mg | INTRAMUSCULAR | Status: DC | PRN

## 2018-05-31 MED ORDER — DIPHENHYDRAMINE HCL 25 MG PO CAPS
50.0000 mg | ORAL_CAPSULE | Freq: Once | ORAL | Status: AC
  Administered 2018-05-31: 50 mg via ORAL
  Filled 2018-05-31: qty 2

## 2018-05-31 NOTE — Progress Notes (Signed)
PT Cancellation Note  Patient Details Name: Derrick Oneill MRN: [id number] DOB: 1951/11/12   Cancelled Treatment:    Reason Eval/Treat Not Completed: PT screened, no needs identified, will sign off   Sharen HeckHill, Jodye Scali Elizabeth Leiah Giannotti PT [phone]  05/31/2018, 1:37 PM

## 2018-05-31 NOTE — Evaluation (Signed)
Clinical/Bedside Swallow Evaluation Patient Details  Name: Derrick Oneill MRN: [id number] Date of Birth: 06/15/1951  Today's Date: 05/31/2018 Time: SLP Start Time (ACUTE ONLY): 1249 SLP Stop Time (ACUTE ONLY): 1314 SLP Time Calculation (min) (ACUTE ONLY): 25 min  Past Medical History:  Past Medical History:  Diagnosis Date  . Cirrhosis (HCC)   . Hypertension   . Renal insufficiency 05/29/2018   Past Surgical History:  Past Surgical History:  Procedure Laterality Date  . UMBILICAL HERNIA REPAIR     HPI:  67 y.o. male with medical history significant for alcoholic cirrhosis with prior alcohol use, and hypertension who presented to the ED with worsening abdominal distention and lower extremity edema with erythema.  He has become much more shortness of breath on account of his abdominal swelling and is usually seen at the [facility] where he is reportedly on the transplant list.  He has apparently had 3 episodes of large volume paracentesis, each approximately 5 L, over a 1.53-month timeframe.  He states that he cannot lay flat on account of his symptoms and has to sit upright to have less shortness of breath.  He has been taking his home Lasix and spironolactone as prescribed and watching his fluid intake, but does not follow his weights at home. CXR on 05/31/18 indicated Cardiomegaly with mild pulmonary venous congestion bilateral interstitial prominence suggesting mild CHF. No pleural effusion. 2.  Low lung volumes with mild bibasilar atelectasis.   Assessment / Plan / Recommendation Clinical Impression   Pt presented with wet cough prior to PO intake/BSE completion and report from pt that he "coughed when taking medications" earlier this morning which generated BSE via nursing; pt denies any previous dysphagia, but has a baseline cough with recent PNA (per pt); delayed cough intermittently during BSE which could indicate potential pharyngeal residue vs CHF dx, so ST will f/u x 1 for diet  tolerance with a meal to assess further potential for dysphagia and need for objective testing.  Regular/thin liquids recommended with ST f/u x1; thank you for this consult. SLP Visit Diagnosis: Dysphagia, unspecified (R13.10)    Aspiration Risk  Mild aspiration risk    Diet Recommendation   Regular/thin liquids (fluid restriction)  Medication Administration: Other (Comment)(Whole meds with liquids; 1 @ time)    Other  Recommendations Oral Care Recommendations: Oral care BID   Follow up Recommendations None      Frequency and Duration min 1 x/week  1 week       Prognosis Prognosis for Safe Diet Advancement: Good      Swallow Study   General Date of Onset: 05/29/18 HPI: 67 y.o. male with medical history significant for alcoholic cirrhosis with prior alcohol use, and hypertension who presented to the ED with worsening abdominal distention and lower extremity edema with erythema.  He has become much more shortness of breath on account of his abdominal swelling and is usually seen at the [facility] where he is reportedly on the transplant list.  He has apparently had 3 episodes of large volume paracentesis, each approximately 5 L, over a 1.81-month timeframe.  He states that he cannot lay flat on account of his symptoms and has to sit upright to have less shortness of breath.  He has been taking his home Lasix and spironolactone as prescribed and watching his fluid intake, but does not follow his weights at home. Type of Study: Bedside Swallow Evaluation Previous Swallow Assessment: n/a Diet Prior to this Study: Regular;Thin liquids;Other (Comment)(fluid restriction) Temperature  Spikes Noted: No Respiratory Status: Room air History of Recent Intubation: No Behavior/Cognition: Alert;Cooperative;Pleasant mood Oral Cavity Assessment: Within Functional Limits Oral Care Completed by SLP: No Oral Cavity - Dentition: Adequate natural dentition Vision: Functional for self-feeding Self-Feeding  Abilities: Able to feed self Patient Positioning: Other (comment)(Upright at edge of bed) Baseline Vocal Quality: Wet Volitional Cough: Strong Volitional Swallow: Able to elicit    Oral/Motor/Sensory Function Overall Oral Motor/Sensory Function: Within functional limits   Ice Chips Ice chips: Not tested   Thin Liquid Thin Liquid: Impaired Presentation: Cup(Pt doesn't use straws) Pharyngeal  Phase Impairments: Cough - Delayed;Other (comments)(intermittent throughout trial)    Nectar Thick Nectar Thick Liquid: Not tested   Honey Thick Honey Thick Liquid: Not tested   Puree Puree: Impaired Presentation: Self Fed Pharyngeal Phase Impairments: Cough - Delayed;Other (comments)(intermittent through trial)   Solid      Solid: Impaired Presentation: Self Fed Pharyngeal Phase Impairments: Cough - Delayed;Other (comments)(intermittent throughout trial)        Tressie StalkerPat Steven Veazie, M.S., CCC-SLP 05/31/2018,1:55 PM

## 2018-05-31 NOTE — Progress Notes (Addendum)
Subjective:  Feels much improved since LVAP. Legs are less painful.   Objective: Vital signs in last 24 hours: Temp:  [97.8 F (36.6 C)-98.2 F (36.8 C)] 98.2 F (36.8 C) (06/27 0524) Pulse Rate:  [77-80] 77 (06/27 0524) Resp:  [18-20] 18 (06/27 0524) BP: (98-120)/(68-79) 98/68 (06/27 0524) SpO2:  [92 %-95 %] 95 % (06/27 0524) Weight:  [186 lb 12.8 oz (84.7 kg)] 186 lb 12.8 oz (84.7 kg) (06/27 0606) Last BM Date: 05/29/18 General:   Alert,  Chronically ill appearing male, pleasant and cooperative in NAD Head:  Normocephalic and atraumatic. Eyes:  Sclera clear, no icterus.  Abdomen:  Soft, nontender. Persistent distention, moderately tense. Normal bowel sounds, without guarding, and without rebound.   Extremities:    2-3+ pitting edema to knees, 1+ to thighs on left, 1-2+ on right thigh with erythema. Both LE bandaged.     Neurologic:  Alert and  oriented x4;  grossly normal neurologically. Skin:  Intact without significant lesions or rashes. Psych:  Alert and cooperative. Normal mood and affect.  Intake/Output from previous day: 06/26 0701 - 06/27 0700 In: 240 [P.O.:240] Out: 2800 [Urine:2800] Intake/Output this shift: No intake/output data recorded.  Lab Results: CBC Recent Labs    05/29/18 2215 05/30/18 0554 05/31/18 0456  WBC 12.9* 10.1 6.8  HGB 9.1* 8.9* 9.0*  HCT 26.0* 26.0* 26.3*  MCV 98.9 99.2 99.2  PLT 106* 109* 123*   BMET Recent Labs    05/29/18 2215 05/30/18 0554 05/31/18 0456  NA 132* 133* 132*  K 3.7 3.5 3.6  CL 101 100 96*  CO2 26 26 26   GLUCOSE 118* 107* 105*  BUN 20 20 18   CREATININE 1.09 1.09 1.02  CALCIUM 8.0* 7.9* 8.1*   LFTs Recent Labs    05/29/18 2215 05/30/18 0554 05/31/18 0456  BILITOT 2.4* 1.8* 2.2*  ALKPHOS 76 69 67  AST 57* 50* 44*  ALT 30 28 26   PROT 7.0 6.8 6.9  ALBUMIN 2.3* 2.3* 2.5*   No results for input(s): LIPASE in the last 72 hours. PT/INR Recent Labs    05/29/18 2215  LABPROT 20.3*  INR 1.75    Ammonia 31    Imaging Studies: Dg Chest 2 View  Result Date: 05/29/2018 CLINICAL DATA:  67 y/o M; bilateral leg pain and swelling. Shortness of breath. EXAM: CHEST - 2 VIEW COMPARISON:  04/17/2017 chest radiograph. FINDINGS: Stable cardiac silhouette within normal limits given projection and technique. Low lung volumes accentuate pulmonary markings. Pulmonary vascular congestion and probable mild interstitial edema. Bibasilar platelike atelectasis. No pleural effusion or pneumothorax. No acute osseous abnormality identified should IMPRESSION: Low lung volumes. Minor bibasilar atelectasis and probable mild interstitial edema. Electronically Signed   By: Mitzi HansenLance  Furusawa-Stratton M.D.   On: 05/29/2018 23:00   [place] Paracentesis  Result Date: 05/30/2018 INDICATION: Symptomatic ascites EXAM: ULTRASOUND GUIDED THERAPEUTIC PARACENTESIS MEDICATIONS: None. COMPLICATIONS: None immediate. PROCEDURE: Informed written consent was obtained from the patient after a discussion of the risks, benefits and alternatives to treatment. A timeout was performed prior to the initiation of the procedure. Initial ultrasound scanning demonstrates a large amount of ascites within the right lower abdominal quadrant. The right lower abdomen was prepped and draped in the usual sterile fashion. 1% lidocaine was used for local anesthesia. After tiny skin nick a 19 gauge, 7-cm, Yueh catheter was introduced. An ultrasound image was saved for documentation purposes. The paracentesis was performed. the catheter was removed and a dressing was applied. The patient tolerated the procedure  well without immediate post procedural complication. FINDINGS: A total of approximately 4 of clear straw-colored fluid was removed. Samples were sent to the laboratory as requested by the clinical team. IMPRESSION: Successful ultrasound-guided paracentesis yielding 4 liters of peritoneal fluid. Electronically Signed   By: Marnee Spring M.D.   On: 05/30/2018  13:29   Dg Abd 2 Views  Result Date: 05/29/2018 CLINICAL DATA:  67 y/o M; bilateral leg and abdominal swelling. History of cirrhosis. EXAM: ABDOMEN - 2 VIEW COMPARISON:  04/28/2018 abdomen radiograph FINDINGS: The bowel gas pattern is normal. There is no evidence of free air. No radio-opaque calculi or other significant radiographic abnormality is seen. Multilevel degenerative changes of the spine. IMPRESSION: Normal bowel gas pattern. Electronically Signed   By: Mitzi Hansen M.D.   On: 05/29/2018 23:03  [2 weeks]   Assessment:  67 y/o male presenting with decompensated cirrhosis presumably due to etoh abuse. Receives care through the [facility] clinic therefore records unavailable. Patient reports last etoh 01/2018. He presents with worsening abdominal distention, SOB, lower extremity edema/erythema.    Cirrhosis: Decompensated.  Current MELD 21.  Baseline unknown.  History of prior hepatic encephalopathy but none at present.  Patient needs to have sustained alcohol cessation to establish candidacy for potential liver transplant in the future, typically at least 6 months alcohol free.  Consider referral process near future.  Anasarca with lower extremity edema/cellulitis.  Weight is down about 20 pounds since admission.  Patient is on Ancef per attending.  2 g sodium diet.  Patient is on Lasix/Aldactone.   Recurrent ascites. LVAP yesterday with removal of 4 L of fluid, patient states he stopped draining therefore procedure was aborted.  Continues to have significant ascites on exam somewhat tense but clinically feeling better.  Initial fluid analysis negative for SBP.  Plan: 1. Continue diuretic regimen. 2. Consider additional LVAP prior to discharge.  Leanna Battles. [facility] [phone] 6/27/201912:43 PM     LOS: 1 day    Attending note:  Patient would benefit from a second LVP. We'll plan for tomorrow.

## 2018-05-31 NOTE — Progress Notes (Signed)
PROGRESS NOTE  [facility]  WGN:[id number]  DOB: 10-09-1951  DOA: 05/29/2018 PCP: Patient, No Pcp Per   Brief Admission Hx: [facility] is a 67 y.o. male with medical history significant for alcoholic cirrhosis with prior alcohol use, and hypertension who presented to the ED with worsening abdominal distention and lower extremity edema with erythema.  He has end stage liver disease and reports that [facility] is [facility]   He has had multiple recent paracentesis procedures at multiple facilities.    MDM/Assessment & Plan:   1. Recurrent tense ascites with anasarca secondary to alcoholic cirrhosis.  Plan for diuresis with Lasix IV twice daily as well as fluid restriction and monitoring of weights and I's and O's. Currently with stable blood pressure readings-we will not administer albumin for now.  Ultrasound-guided large volume paracentesis to be scheduled for a.m with albumin infusion that time.  Will hold off coverage for possible SBP until fluid is collected, no significant abdominal pain noted at this time aside from discomfort associated with distention.  Pt had paracentesis on 6/26 and 4 Liters was removed.  GI consulted and following.  He is diuresing much better, ammonia is down  requested.  2. Cough - He started coughing with morning meds, check CXR and obtain SLP evaluation.  3. Bilateral lower extremity moderate,nonpurulent cellulitis left greater than right.  Continue IV Ancef.  This is recurrent for the patient. 4. AKI versus progression of CKD.  Creatinine is elevated compared to prior baseline of 0.6 back in 2017.   5. Thrombocytopenia. Likely related to chronic liver disease. Follow CBC.  6. Anemia of chronic disease - No overt bleeding noted; likely due to chronic liver disease. Can follow up at [facility]. 7. Chronic alcoholic cirrhosis.  Patient follows at [facility] and is on transplant list.  Will need follow-up for transplant at that facility. 8. Hypertension.   Plan for aggressive diuresis with IV Lasix twice daily as well as spironolactone.  Monitor carefully. 9. History of prior alcohol abuse.  Denies any current use.  Will maintain on CIWA protocol regardless.  DVT prophylaxis: Heparin Code Status: DNI, but not DNR Family Communication: None at bedside Disposition Plan:Admit for diuresis/paracentesis  Consultants:  GI  Procedures:  US paracentesis  Subjective: Pt reports that he is feeling better, he started coughing after taking his morning meds and a chest xray was ordered.      Objective: Vitals:   05/30/18 1308 05/30/18 2148 05/31/18 0524 05/31/18 0606  BP: 118/77 105/68 98/68   Pulse: 78 78 77   Resp: 18 18 18    Temp:  97.8 F (36.6 C) 98.2 F (36.8 C)   TempSrc:  Oral Oral   SpO2: 92% 94% 95%   Weight:    84.7 kg (186 lb 12.8 oz)  Height:        Intake/Output Summary (Last 24 hours) at 05/31/2018 0920 Last data filed at 05/31/2018 0600 Gross per 24 hour  Intake 240 ml  Output 2800 ml  Net -2560 ml   Filed Weights   05/30/18 0119 05/30/18 0601 05/31/18 0606  Weight: 91 kg (200 lb 9.6 oz) 90 kg (198 lb 8 oz) 84.7 kg (186 lb 12.8 oz)   REVIEW OF SYSTEMS  As per history otherwise all reviewed and reported negative  Exam:  General exam: chronically ill appearing, older than stated age.  Respiratory system: shallow breathing.  No increased work of breathing. Cardiovascular system: S1 & S2 heard, RRR.  Gastrointestinal system: Abdomen is distended gravid, tympanitic. Normal bowel sounds heard. Central nervous system: Alert and oriented. No focal neurological deficits. Extremities: 1+ pitting edema bilateral with erythema noted BLE consistent with cellulitis infection.  Data Reviewed: Basic Metabolic Panel: Recent Labs  Lab 05/29/18 2215 05/30/18 0554 05/31/18 0456  NA 132* 133* 132*  K 3.7 3.5 3.6  CL 101 100 96*  CO2 26 26 26   GLUCOSE 118* 107* 105*  BUN 20 20 18   CREATININE 1.09 1.09 1.02  CALCIUM  8.0* 7.9* 8.1*   Liver Function Tests: Recent Labs  Lab 05/29/18 2215 05/30/18 0554 05/31/18 0456  AST 57* 50* 44*  ALT 30 28 26   ALKPHOS 76 69 67  BILITOT 2.4* 1.8* 2.2*  PROT 7.0 6.8 6.9  ALBUMIN 2.3* 2.3* 2.5*   No results for input(s): LIPASE, AMYLASE in the last 168 hours. Recent Labs  Lab 05/29/18 2215 05/31/18 0456  AMMONIA 43* 31   CBC: Recent Labs  Lab 05/29/18 2215 05/30/18 0554 05/31/18 0456  WBC 12.9* 10.1 6.8  NEUTROABS 10.8*  --  5.2  HGB 9.1* 8.9* 9.0*  HCT 26.0* 26.0* 26.3*  MCV 98.9 99.2 99.2  PLT 106* 109* 123*   Cardiac Enzymes: No results for input(s): CKTOTAL, CKMB, CKMBINDEX, TROPONINI in the last 168 hours. CBG (last 3)  No results for input(s): GLUCAP in the last 72 hours. Recent Results (from the past 240 hour(s))  Gram stain     Status: None   Collection Time: 05/30/18 12:43 PM  Result Value Ref Range Status   Specimen Description ASCITIC  Final   Special Requests NONE  Final   Gram Stain   Final    NO ORGANISMS SEEN WBC PRESENT, PREDOMINANTLY MONONUCLEAR CYTOSPIN SMEAR Performed at [facility], [address], [place], [place] [address]    Report Status 05/30/2018 FINAL  Final  Culture, body fluid-bottle     Status: None (Preliminary result)   Collection Time: 05/30/18 12:43 PM  Result Value Ref Range Status   Specimen Description ASCITIC  Final   Special Requests BOTTLES DRAWN AEROBIC AND ANAEROBIC 10CC EACH  Final   Culture   Final    NO GROWTH < 24 HOURS Performed at [facility], [address] [place], [place] [address]    Report Status PENDING  Incomplete    Studies: Dg Chest 2 View  Result Date: 05/29/2018 CLINICAL DATA:  67 y/o M; bilateral leg pain and swelling. Shortness of breath. EXAM: CHEST - 2 VIEW COMPARISON:  04/17/2017 chest radiograph. FINDINGS: Stable cardiac silhouette within normal limits given projection and technique. Low lung volumes accentuate pulmonary markings. Pulmonary vascular congestion and  probable mild interstitial edema. Bibasilar platelike atelectasis. No pleural effusion or pneumothorax. No acute osseous abnormality identified should IMPRESSION: Low lung volumes. Minor bibasilar atelectasis and probable mild interstitial edema. Electronically Signed   By: Mitzi Hansen M.D.   On: 05/29/2018 23:00   US Paracentesis  Result Date: 05/30/2018 INDICATION: Symptomatic ascites EXAM: ULTRASOUND GUIDED THERAPEUTIC PARACENTESIS MEDICATIONS: None. COMPLICATIONS: None immediate. PROCEDURE: Informed written consent was obtained from the patient after a discussion of the risks, benefits and alternatives to treatment. A timeout was performed prior to the initiation of the procedure. Initial ultrasound scanning demonstrates a large amount of ascites within the right lower abdominal quadrant. The right lower abdomen was prepped and draped in the usual sterile fashion. 1% lidocaine was used for local anesthesia. After tiny skin nick a 19 gauge, 7-cm, Yueh catheter was introduced. An ultrasound  image was saved for documentation purposes. The paracentesis was performed. the catheter was removed and a dressing was applied. The patient tolerated the procedure well without immediate post procedural complication. FINDINGS: A total of approximately 4 of clear straw-colored fluid was removed. Samples were sent to the laboratory as requested by the clinical team. IMPRESSION: Successful ultrasound-guided paracentesis yielding 4 liters of peritoneal fluid. Electronically Signed   By: Marnee Spring M.D.   On: 05/30/2018 13:29   Dg Abd 2 Views  Result Date: 05/29/2018 CLINICAL DATA:  67 y/o M; bilateral leg and abdominal swelling. History of cirrhosis. EXAM: ABDOMEN - 2 VIEW COMPARISON:  04/28/2018 abdomen radiograph FINDINGS: The bowel gas pattern is normal. There is no evidence of free air. No radio-opaque calculi or other significant radiographic abnormality is seen. Multilevel degenerative changes of  the spine. IMPRESSION: Normal bowel gas pattern. Electronically Signed   By: Mitzi Hansen M.D.   On: 05/29/2018 23:03   Scheduled Meds: . folic acid  1 mg Oral Daily  . furosemide  40 mg Intravenous Q12H  . heparin  5,000 Units Subcutaneous Q8H  . lactulose  20 g Oral BID  . multivitamin with minerals  1 tablet Oral Daily  . sodium chloride flush  3 mL Intravenous Q12H  . spironolactone  100 mg Oral Daily  . thiamine  100 mg Oral Daily   Continuous Infusions: . sodium chloride    .  ceFAZolin (ANCEF) IV 1 g (05/31/18 1610)    Principal Problem:   Ascites Active Problems:   Hepatic cirrhosis (HCC)   Alcohol abuse   Essential hypertension   Renal insufficiency   Anasarca   Cellulitis   Liver failure (HCC)  Time spent:   Standley Dakins, MD, FAAFP [facility] Pager [phone]  If 7PM-7AM, please contact night-coverage [email or web address] Password [email or web address] 05/31/2018, 9:20 AM    LOS: 1 day

## 2018-06-01 ENCOUNTER — Inpatient Hospital Stay (HOSPITAL_COMMUNITY): Payer: Non-veteran care

## 2018-06-01 DIAGNOSIS — F1010 Alcohol abuse, uncomplicated: Secondary | ICD-10-CM

## 2018-06-01 DIAGNOSIS — L03119 Cellulitis of unspecified part of limb: Secondary | ICD-10-CM

## 2018-06-01 DIAGNOSIS — N289 Disorder of kidney and ureter, unspecified: Secondary | ICD-10-CM

## 2018-06-01 LAB — CBC
HCT: 27.3 % — ABNORMAL LOW (ref 39.0–52.0)
Hemoglobin: 9.3 g/dL — ABNORMAL LOW (ref 13.0–17.0)
MCH: 33.6 pg (ref 26.0–34.0)
MCHC: 34.1 g/dL (ref 30.0–36.0)
MCV: 98.6 fL (ref 78.0–100.0)
Platelets: 120 10*3/uL — ABNORMAL LOW (ref 150–400)
RBC: 2.77 MIL/uL — ABNORMAL LOW (ref 4.22–5.81)
RDW: 14.9 % (ref 11.5–15.5)
WBC: 6 10*3/uL (ref 4.0–10.5)

## 2018-06-01 LAB — COMPREHENSIVE METABOLIC PANEL
ALBUMIN: 2.4 g/dL — AB (ref 3.5–5.0)
ALT: 21 U/L (ref 0–44)
AST: 45 U/L — ABNORMAL HIGH (ref 15–41)
Alkaline Phosphatase: 66 U/L (ref 38–126)
Anion gap: 7 (ref 5–15)
BUN: 15 mg/dL (ref 8–23)
CHLORIDE: 97 mmol/L — AB (ref 98–111)
CO2: 30 mmol/L (ref 22–32)
Calcium: 8.2 mg/dL — ABNORMAL LOW (ref 8.9–10.3)
Creatinine, Ser: 0.98 mg/dL (ref 0.61–1.24)
GFR calc Af Amer: >60 mL/min (ref >60)
GFR calc non Af Amer: >60 mL/min (ref >60)
GLUCOSE: 130 mg/dL — AB (ref 70–99)
POTASSIUM: 3.3 mmol/L — AB (ref 3.5–5.1)
Sodium: 134 mmol/L — ABNORMAL LOW (ref 135–145)
Total Bilirubin: 1.9 mg/dL — ABNORMAL HIGH (ref 0.3–1.2)
Total Protein: 6.8 g/dL (ref 6.5–8.1)

## 2018-06-01 LAB — MAGNESIUM: Magnesium: 1.9 mg/dL (ref 1.7–2.4)

## 2018-06-01 LAB — PATHOLOGIST SMEAR REVIEW

## 2018-06-01 MED ORDER — POTASSIUM CHLORIDE CRYS ER 20 MEQ PO TBCR
40.0000 meq | EXTENDED_RELEASE_TABLET | Freq: Once | ORAL | Status: AC
  Administered 2018-06-01: 40 meq via ORAL
  Filled 2018-06-01: qty 2

## 2018-06-01 MED ORDER — ADULT MULTIVITAMIN W/MINERALS CH: 1.0000 | ORAL_TABLET | Freq: Every day | ORAL | Status: AC

## 2018-06-01 MED ORDER — CEPHALEXIN 500 MG PO CAPS: 500.0000 mg | ORAL_CAPSULE | Freq: Four times a day (QID) | ORAL | 0 refills | Status: AC

## 2018-06-01 MED ORDER — ALBUMIN HUMAN 25 % IV SOLN
25.0000 g | Freq: Once | INTRAVENOUS | Status: AC
  Administered 2018-06-01: 25 g via INTRAVENOUS
  Filled 2018-06-01: qty 50

## 2018-06-01 NOTE — Discharge Summary (Addendum)
Physician Discharge Summary  Derrick RossBarry K Twiford WUJ:[id number] DOB: [birth year] DOA: 05/29/2018  [facility]  Admit date: 05/29/2018 Discharge date: 06/01/2018  Admitted From: Home  Disposition: Home   Recommendations for Outpatient Follow-up:  1. Follow up with PCP in 1 weeks 2. Follow up with gastroenterologist in 1-2 weeks 3. Please obtain BMP/CBC in one week  Discharge Condition: STABLE   CODE STATUS: PARTIAL    Brief Hospitalization Summary: Please see all hospital notes, images, labs for full details of the hospitalization.  HPI: Derrick Oneill is a 67 y.o. male with medical history significant for alcoholic cirrhosis with prior alcohol use, and hypertension who presented to the ED with worsening abdominal distention and lower extremity edema with erythema.  He has become much more shortness of breath on account of his abdominal swelling and [facility] on the [facility] list.  He has apparently had 3 episodes of large volume paracentesis, each approximately 5 L, over a 1.7268-month timeframe.  He states that he cannot lay flat on account of his symptoms and has to sit upright to have less shortness of breath.  He has been taking his home Lasix and spironolactone as prescribed and watching his fluid intake, but does not follow his weights at home. He denies any chest pain, significant abdominal pain, nausea, vomiting, diarrhea, fever, or chills.   ED Course: Vital signs are stable with stable blood pressure readings.  Labs demonstrate leukocytosis of almost 13,000 and hemoglobin of 9.1 (prior 11.4) with platelets of 106.  43 noted.  INR 1.75 and albumin 2.3.  Two-view chest x-ray with some mild bibasilar atelectasis versus some interstitial edema.  2 view abdominal film with no acute abnormalities noted.  MDM/Assessment & Plan:   1. Recurrent tense ascites with anasarca secondary to alcoholic cirrhosis. Feeling better with  diuresis with Lasix IV twice daily as well as fluid restriction and monitoring of weights and I's and O's.  He has diuresed 10L.    He felt better after initial LVP removed 4L and GI planned repeat LVP but there was not enough fluid found for another paracentesis.  Pt asking to go home. Will dc home and have him follow [facility].   2. Cough - resolved now.  CXR was ok and he had a full SLP evaluation and they recommend Regular,thin.   3. Bilateral lower extremitymoderate,nonpurulentcellulitis left greater than right. Treated with IV Ancef and much improved. DC home on oral cephalexin. This is improving. 4. Acute renal failure on CKD stage 1Resolved now.  Creatinine is elevated compared to prior baseline of 0.6 back in 2017.  5. Thrombocytopenia. Likely related to chronic liver disease.   6. Anemia of chronic disease - No overt bleeding noted; likely due to chronic liver disease. [facility] 7. Chronic alcoholic cirrhosis. [facility]  8. Hypertension. stable, controlled.  9. History of prior alcohol abuse. Denies any current use.   DVT prophylaxis:Heparin Code Status:DNI Family Communication: Disposition Plan:Admit for diuresis/paracentesis  Consultants:  GI  Procedures:  US paracentesis  Discharge Diagnoses:  Principal Problem:   Ascites Active Problems:   Hepatic cirrhosis (HCC)   Alcohol abuse   Essential hypertension   Renal insufficiency   Anasarca   Cellulitis   Liver failure [facility](HCC)  Discharge Instructions: Discharge Instructions    Call MD for:  difficulty breathing, headache or  visual disturbances   Complete by:  As directed    Call MD for:  persistant dizziness or light-headedness   Complete by:  As directed    Call MD for:  persistant nausea and vomiting   Complete by:  As directed    Call MD for:  redness, tenderness, or signs of infection (pain, swelling, redness, odor  or green/yellow discharge around incision site)   Complete by:  As directed    Call MD for:  severe uncontrolled pain   Complete by:  As directed    Increase activity slowly   Complete by:  As directed      Allergies as of 06/01/2018   No Known Allergies     Medication List    TAKE these medications   bumetanide 1 MG tablet Commonly known as:  BUMEX Take 1 mg by mouth 2 (two) times daily.   cephALEXin 500 MG capsule Commonly known as:  KEFLEX Take 1 capsule (500 mg total) by mouth 4 (four) times daily for 6 days.   CERAVE ITCH RELIEF 1 % Crea Generic drug:  Pramoxine HCl Apply 1 application topically daily as needed (for itching/moisturizing).   cholecalciferol 1000 units tablet Commonly known as:  VITAMIN D Take 1,000 Units by mouth daily.   clobetasol cream 0.05 % Commonly known as:  TEMOVATE Apply 1 application topically 2 (two) times daily as needed (Apply a thin layer as needed for psoriasis).   docusate sodium 100 MG capsule Commonly known as:  COLACE Take 200 mg by mouth 2 (two) times daily as needed for mild constipation or moderate constipation.   ferrous sulfate 325 (65 FE) MG tablet Take 325 mg by mouth daily with breakfast.   folic acid 1 MG tablet Commonly known as:  FOLVITE Take 1 mg by mouth daily.   lactulose 10 GM/15ML solution Commonly known as:  CHRONULAC Take 20 g by mouth 3 (three) times daily.   multivitamin with minerals Tabs tablet Take 1 tablet by mouth daily. Start taking on:  06/02/2018   potassium chloride SA 20 MEQ tablet Commonly known as:  K-DUR,KLOR-CON Take 20 mEq by mouth 2 (two) times daily.   spironolactone 50 MG tablet Commonly known as:  ALDACTONE Take 100 mg by mouth daily.   thiamine 100 MG tablet Take 100 mg by mouth daily.   vitamin B-12 1000 MCG tablet Commonly known as:  CYANOCOBALAMIN Take 1,000 mcg by mouth daily.   Vitamin D (Ergocalciferol) 50000 units Caps capsule Commonly known as:  DRISDOL Take  50,000 Units by mouth every 7 (seven) days.      Follow-up Information    [facility]. Schedule an appointment as soon as possible for a visit in 1 week(s).   Contact information: [address] [place] [address] [phone]          No Known Allergies Allergies as of 06/01/2018   No Known Allergies     Medication List    TAKE these medications   bumetanide 1 MG tablet Commonly known as:  BUMEX Take 1 mg by mouth 2 (two) times daily.   cephALEXin 500 MG capsule Commonly known as:  KEFLEX Take 1 capsule (500 mg total) by mouth 4 (four) times daily for 6 days.   CERAVE ITCH RELIEF 1 % Crea Generic drug:  Pramoxine HCl Apply 1 application topically daily as needed (for itching/moisturizing).   cholecalciferol 1000 units tablet Commonly known as:  VITAMIN D Take 1,000 Units by mouth daily.  clobetasol cream 0.05 % Commonly known as:  TEMOVATE Apply 1 application topically 2 (two) times daily as needed (Apply a thin layer as needed for psoriasis).   docusate sodium 100 MG capsule Commonly known as:  COLACE Take 200 mg by mouth 2 (two) times daily as needed for mild constipation or moderate constipation.   ferrous sulfate 325 (65 FE) MG tablet Take 325 mg by mouth daily with breakfast.   folic acid 1 MG tablet Commonly known as:  FOLVITE Take 1 mg by mouth daily.   lactulose 10 GM/15ML solution Commonly known as:  CHRONULAC Take 20 g by mouth 3 (three) times daily.   multivitamin with minerals Tabs tablet Take 1 tablet by mouth daily. Start taking on:  06/02/2018   potassium chloride SA 20 MEQ tablet Commonly known as:  K-DUR,KLOR-CON Take 20 mEq by mouth 2 (two) times daily.   spironolactone 50 MG tablet Commonly known as:  ALDACTONE Take 100 mg by mouth daily.   thiamine 100 MG tablet Take 100 mg by mouth daily.   vitamin B-12 1000 MCG tablet Commonly known as:  CYANOCOBALAMIN Take 1,000 mcg by mouth daily.   Vitamin D  (Ergocalciferol) 50000 units Caps capsule Commonly known as:  DRISDOL Take 50,000 Units by mouth every 7 (seven) days.       Procedures/Studies: Dg Chest 2 View  Result Date: 05/29/2018 CLINICAL DATA:  67 y/o M; bilateral leg pain and swelling. Shortness of breath. EXAM: CHEST - 2 VIEW COMPARISON:  04/17/2017 chest radiograph. FINDINGS: Stable cardiac silhouette within normal limits given projection and technique. Low lung volumes accentuate pulmonary markings. Pulmonary vascular congestion and probable mild interstitial edema. Bibasilar platelike atelectasis. No pleural effusion or pneumothorax. No acute osseous abnormality identified should IMPRESSION: Low lung volumes. Minor bibasilar atelectasis and probable mild interstitial edema. Electronically Signed   By: Mitzi Hansen M.D.   On: 05/29/2018 23:00   US Abdomen Limited  Result Date: 06/01/2018 INDICATION: Ascites. EXAM: ULTRASOUND GUIDED PARACENTESIS MEDICATIONS: None. ANESTHESIA/SEDATION: None. COMPLICATIONS: None. PROCEDURE: Ultrasound was performed and revealed only a tiny amount of ascites. Paracentesis not performed at this time due to small amount of intra-abdominal fluid. Repeat attempt can be obtained as needed. FINDINGS: A paracentesis not performed due to low volume of fluid. IMPRESSION: Paracentesis not performed due to low volume of fluid. Follow-up exam can be obtained as needed. Electronically Signed   By: Maisie Fus  Register   On: 06/01/2018 14:04   US Paracentesis  Result Date: 05/30/2018 INDICATION: Symptomatic ascites EXAM: ULTRASOUND GUIDED THERAPEUTIC PARACENTESIS MEDICATIONS: None. COMPLICATIONS: None immediate. PROCEDURE: Informed written consent was obtained from the patient after a discussion of the risks, benefits and alternatives to treatment. A timeout was performed prior to the initiation of the procedure. Initial ultrasound scanning demonstrates a large amount of ascites within the right lower abdominal  quadrant. The right lower abdomen was prepped and draped in the usual sterile fashion. 1% lidocaine was used for local anesthesia. After tiny skin nick a 19 gauge, 7-cm, Yueh catheter was introduced. An ultrasound image was saved for documentation purposes. The paracentesis was performed. the catheter was removed and a dressing was applied. The patient tolerated the procedure well without immediate post procedural complication. FINDINGS: A total of approximately 4 of clear straw-colored fluid was removed. Samples were sent to the laboratory as requested by the clinical team. IMPRESSION: Successful ultrasound-guided paracentesis yielding 4 liters of peritoneal fluid. Electronically Signed   By: Marnee Spring M.D.   On: 05/30/2018 13:29  Dg Chest Port 1 View  Result Date: 05/31/2018 CLINICAL DATA:  Cough after taking medicine. EXAM: PORTABLE CHEST 1 VIEW COMPARISON:  05/29/2018. FINDINGS: Cardiomegaly with mild pulmonary vascular prominence and bilateral interstitial prominence suggesting mild CHF. Mild bibasilar subsegmental atelectasis. No pleural effusion or pneumothorax. IMPRESSION: 1. Cardiomegaly with mild pulmonary venous congestion bilateral interstitial prominence suggesting mild CHF. No pleural effusion. 2.  Low lung volumes with mild bibasilar atelectasis. Electronically Signed   By: Maisie Fus  Register   On: 05/31/2018 10:26   Dg Abd 2 Views  Result Date: 05/29/2018 CLINICAL DATA:  67 y/o M; bilateral leg and abdominal swelling. History of cirrhosis. EXAM: ABDOMEN - 2 VIEW COMPARISON:  04/28/2018 abdomen radiograph FINDINGS: The bowel gas pattern is normal. There is no evidence of free air. No radio-opaque calculi or other significant radiographic abnormality is seen. Multilevel degenerative changes of the spine. IMPRESSION: Normal bowel gas pattern. Electronically Signed   By: Mitzi Hansen M.D.   On: 05/29/2018 23:03     Subjective: Pt says he feels much better and lighter, he  wants to go home.    Discharge Exam: Vitals:   06/01/18 0044 06/01/18 0500  BP: 120/87 112/71  Pulse: 83 85  Resp:  18  Temp:  98.4 F (36.9 C)  SpO2:  97%   Vitals:   05/31/18 2011 05/31/18 2122 06/01/18 0044 06/01/18 0500  BP:  106/68 120/87 112/71  Pulse:  78 83 85  Resp:  20  18  Temp:  98.2 F (36.8 C)  98.4 F (36.9 C)  TempSrc:  Oral  Oral  SpO2: 94% 95%  97%  Weight:    81.1 kg (178 lb 11.2 oz)  Height:       General exam: chronically ill appearing, older than stated age.  Respiratory system:  No increased work of breathing. Cardiovascular system: S1 & S2 heard, RRR.  Gastrointestinal system: Abdomen is less distended.  Normal bowel sounds heard. Central nervous system: Alert and oriented. No focal neurological deficits. Extremities: trace pretibial edema bilateral with erythema noted BLE consistent with improving cellulitis infection.   The results of significant diagnostics from this hospitalization (including imaging, microbiology, ancillary and laboratory) are listed below for reference.     Microbiology: Recent Results (from the past 240 hour(s))  Gram stain     Status: None   Collection Time: 05/30/18 12:43 PM  Result Value Ref Range Status   Specimen Description ASCITIC  Final   Special Requests NONE  Final   Gram Stain   Final    NO ORGANISMS SEEN WBC PRESENT, PREDOMINANTLY MONONUCLEAR CYTOSPIN SMEAR Performed at [facility], [address] [place], [place] [address]    Report Status 05/30/2018 FINAL  Final  Culture, body fluid-bottle     Status: None (Preliminary result)   Collection Time: 05/30/18 12:43 PM  Result Value Ref Range Status   Specimen Description ASCITIC  Final   Special Requests BOTTLES DRAWN AEROBIC AND ANAEROBIC 10CC EACH  Final   Culture   Final    NO GROWTH 2 DAYS Performed at [facility], [address] [place], [place] [address]    Report Status PENDING  Incomplete     Labs: BNP (last 3 results) No results for  input(s): BNP in the last 8760 hours. Basic Metabolic Panel: Recent Labs  Lab 05/29/18 2215 05/30/18 0554 05/31/18 0456 06/01/18 0420  NA 132* 133* 132* 134*  K 3.7 3.5 3.6 3.3*  CL 101 100 96* 97*  CO2 26 26  26 30  GLUCOSE 118* 107* 105* 130*  BUN 20 20 18 15   CREATININE 1.09 1.09 1.02 0.98  CALCIUM 8.0* 7.9* 8.1* 8.2*  MG  --   --   --  1.9   Liver Function Tests: Recent Labs  Lab 05/29/18 2215 05/30/18 0554 05/31/18 0456 06/01/18 0420  AST 57* 50* 44* 45*  ALT 30 28 26 21   ALKPHOS 76 69 67 66  BILITOT 2.4* 1.8* 2.2* 1.9*  PROT 7.0 6.8 6.9 6.8  ALBUMIN 2.3* 2.3* 2.5* 2.4*   No results for input(s): LIPASE, AMYLASE in the last 168 hours. Recent Labs  Lab 05/29/18 2215 05/31/18 0456  AMMONIA 43* 31   CBC: Recent Labs  Lab 05/29/18 2215 05/30/18 0554 05/31/18 0456 06/01/18 0420  WBC 12.9* 10.1 6.8 6.0  NEUTROABS 10.8*  --  5.2  --   HGB 9.1* 8.9* 9.0* 9.3*  HCT 26.0* 26.0* 26.3* 27.3*  MCV 98.9 99.2 99.2 98.6  PLT 106* 109* 123* 120*   Cardiac Enzymes: No results for input(s): CKTOTAL, CKMB, CKMBINDEX, TROPONINI in the last 168 hours. BNP: Invalid input(s): POCBNP CBG: No results for input(s): GLUCAP in the last 168 hours. D-Dimer No results for input(s): DDIMER in the last 72 hours. Hgb A1c No results for input(s): HGBA1C in the last 72 hours. Lipid Profile No results for input(s): CHOL, HDL, LDLCALC, TRIG, CHOLHDL, LDLDIRECT in the last 72 hours. Thyroid function studies No results for input(s): TSH, T4TOTAL, T3FREE, THYROIDAB in the last 72 hours.  Invalid input(s): FREET3 Anemia work up No results for input(s): VITAMINB12, FOLATE, FERRITIN, TIBC, IRON, RETICCTPCT in the last 72 hours. Urinalysis No results found for: COLORURINE, APPEARANCEUR, LABSPEC, PHURINE, GLUCOSEU, HGBUR, BILIRUBINUR, KETONESUR, PROTEINUR, UROBILINOGEN, NITRITE, LEUKOCYTESUR Sepsis Labs Invalid input(s): PROCALCITONIN,  WBC,  LACTICIDVEN Microbiology Recent Results  (from the past 240 hour(s))  Gram stain     Status: None   Collection Time: 05/30/18 12:43 PM  Result Value Ref Range Status   Specimen Description ASCITIC  Final   Special Requests NONE  Final   Gram Stain   Final    NO ORGANISMS SEEN WBC PRESENT, PREDOMINANTLY MONONUCLEAR CYTOSPIN SMEAR Performed at [facility], [address] [place], [place] [address]    Report Status 05/30/2018 FINAL  Final  Culture, body fluid-bottle     Status: None (Preliminary result)   Collection Time: 05/30/18 12:43 PM  Result Value Ref Range Status   Specimen Description ASCITIC  Final   Special Requests BOTTLES DRAWN AEROBIC AND ANAEROBIC 10CC EACH  Final   Culture   Final    NO GROWTH 2 DAYS Performed at [facility], [address], [place], [place] [address]    Report Status PENDING  Incomplete   Time coordinating discharge:   SIGNED:  Standley Dakins, MD  [facility] 06/01/2018, 2:42 PM Pager [phone]  If 7PM-7AM, please contact night-coverage [email or web address] Password [email or web address]

## 2018-06-01 NOTE — Progress Notes (Signed)
Subjective: Denies abdominal pain, N/V, dyspnea, hematochezia, melena. Overall feels good. No GI complaints.  Objective: Vital signs in last 24 hours: Temp:  [98.2 F (36.8 C)-98.4 F (36.9 C)] 98.4 F (36.9 C) (06/28 0500) Pulse Rate:  [78-85] 85 (06/28 0500) Resp:  [18-20] 18 (06/28 0500) BP: (106-120)/(68-87) 112/71 (06/28 0500) SpO2:  [94 %-97 %] 97 % (06/28 0500) Weight:  [178 lb 11.2 oz (81.1 kg)] 178 lb 11.2 oz (81.1 kg) (06/28 0500) Last BM Date: 05/30/18 General:   Alert and oriented, pleasant Eyes:  No icterus, sclera clear. Conjuctiva pink.  Heart:  S1, S2 present, no murmurs noted.  Lungs: Clear to auscultation bilaterally, without wheezing, rales, or rhonchi.  Abdomen:  Bowel sounds present, rounded and firm but no tense ascites, non-tender, non-distended. No HSM or hernias noted. No rebound or guarding. No masses appreciated  Msk:  Symmetrical without gross deformities. Neurologic:  Alert and  oriented x4;  grossly normal neurologically. Skin:  Warm and dry, intact without significant lesions.  Psych:  Alert and cooperative. Normal mood and affect.  Intake/Output from previous day: 06/27 0701 - 06/28 0700 In: 1110 [P.O.:1060; IV Piggyback:50] Out: 6700 [Urine:6700] Intake/Output this shift: Total I/O In: 600 [P.O.:600] Out: 1500 [Urine:1500]  Lab Results: Recent Labs    05/30/18 0554 05/31/18 0456 06/01/18 0420  WBC 10.1 6.8 6.0  HGB 8.9* 9.0* 9.3*  HCT 26.0* 26.3* 27.3*  PLT 109* 123* 120*   BMET Recent Labs    05/30/18 0554 05/31/18 0456 06/01/18 0420  NA 133* 132* 134*  K 3.5 3.6 3.3*  CL 100 96* 97*  CO2 26 26 30   GLUCOSE 107* 105* 130*  BUN 20 18 15   CREATININE 1.09 1.02 0.98  CALCIUM 7.9* 8.1* 8.2*   LFT Recent Labs    05/30/18 0554 05/31/18 0456 06/01/18 0420  PROT 6.8 6.9 6.8  ALBUMIN 2.3* 2.5* 2.4*  AST 50* 44* 45*  ALT 28 26 21   ALKPHOS 69 67 66  BILITOT 1.8* 2.2* 1.9*   PT/INR Recent Labs    05/29/18 2215   LABPROT 20.3*  INR 1.75   Hepatitis Panel No results for input(s): HEPBSAG, HCVAB, HEPAIGM, HEPBIGM in the last 72 hours.   Studies/Results: US Abdomen Limited  Result Date: 06/01/2018 INDICATION: Ascites. EXAM: ULTRASOUND GUIDED PARACENTESIS MEDICATIONS: None. ANESTHESIA/SEDATION: None. COMPLICATIONS: None. PROCEDURE: Ultrasound was performed and revealed only a tiny amount of ascites. Paracentesis not performed at this time due to small amount of intra-abdominal fluid. Repeat attempt can be obtained as needed. FINDINGS: A paracentesis not performed due to low volume of fluid. IMPRESSION: Paracentesis not performed due to low volume of fluid. Follow-up exam can be obtained as needed. Electronically Signed   By: Maisie Fus  Register   On: 06/01/2018 14:04   Dg Chest Port 1 View  Result Date: 05/31/2018 CLINICAL DATA:  Cough after taking medicine. EXAM: PORTABLE CHEST 1 VIEW COMPARISON:  05/29/2018. FINDINGS: Cardiomegaly with mild pulmonary vascular prominence and bilateral interstitial prominence suggesting mild CHF. Mild bibasilar subsegmental atelectasis. No pleural effusion or pneumothorax. IMPRESSION: 1. Cardiomegaly with mild pulmonary venous congestion bilateral interstitial prominence suggesting mild CHF. No pleural effusion. 2.  Low lung volumes with mild bibasilar atelectasis. Electronically Signed   By: Maisie Fus  Register   On: 05/31/2018 10:26    Assessment: 67 y/o male presenting with decompensated cirrhosis presumably due to etoh abuse. Receives care through the [facility] clinic therefore records unavailable. Patient reports last etoh 01/2018. He presents with worsening abdominal distention,  SOB, lower extremity edema/erythema.    Cirrhosis: Decompensated.  Current MELD 21.  Baseline unknown.  History of prior hepatic encephalopathy but none at present.  Patient needs to have sustained alcohol cessation to establish candidacy for potential liver transplant in the future, typically at  least 6 months alcohol free.  Consider referral process near future.  Anasarca with lower extremity edema/cellulitis.  Weight is down about 20 pounds since admission.  Patient is on Ancef per attending.  2 g sodium diet.  Patient is on Lasix/Aldactone.   Recurrent ascites. LVAP yesterday with removal of 4 L of fluid, patient states he stopped draining therefore procedure was aborted.  Continues to have significant ascites on exam somewhat tense but clinically feeling better.  Initial fluid analysis negative for SBP. Planned paracentesis today before possible discharge.  Today he is overall stable. Hgb stable, plt depressed at 120 but stable. CMP stable with mild elevation in AST and bilirubin at 1.9.  Plan: 1. Paracentesis today 2. Continue diuretics 3. Supportive measures 4. Low sodium diet   Thank you for allowing us to participate in the care of Derrick Oneill  Wynne DustEric Gill, DNP, AGNP-C Adult & Gerontological Nurse Practitioner [facility]     LOS: 2 days    06/01/2018, 4:04 PM

## 2018-06-01 NOTE — Progress Notes (Signed)
PROGRESS NOTE  Derrick RossBarry K Oneill  ZOX:[id number]  DOB: December 12, 1950  DOA: 05/29/2018 PCP: Patient, No Pcp Per  Brief Admission Hx: Derrick Oneill is a 67 y.o. male with medical history significant for alcoholic cirrhosis with prior alcohol use, and hypertension who presented to the ED with worsening abdominal distention and lower extremity edema with erythema.  He has end stage liver disease and reports that he is being treated with [facility].   He has had multiple recent paracentesis procedures at multiple facilities.    MDM/Assessment & Plan:   1. Recurrent tense ascites with anasarca secondary to alcoholic cirrhosis.  Feeling better with diuresis with Lasix IV twice daily as well as fluid restriction and monitoring of weights and I's and O's.  He felt better after initial LVP removed 4L and GI planning repeat LVP today.   2. Cough - CXR was ok and he had a full SLP evaluation and they recommend Regular,thin.   3. Bilateral lower extremity moderate,nonpurulent cellulitis left greater than right.  Continue IV Ancef.  This is improving. 4. AKI versus progression of CKD.  Creatinine is elevated compared to prior baseline of 0.6 back in 2017.   5. Thrombocytopenia. Likely related to chronic liver disease. Follow CBC.  6. Anemia of chronic disease - No overt bleeding noted; likely due to chronic liver disease. [facility]. 7. Chronic alcoholic cirrhosis.  [facility] and [facility].  Will need follow-up for transplant at that facility. 8. Hypertension.  Diuresis with IV Lasix twice daily as well as spironolactone.  Monitor carefully. 9. History of prior alcohol abuse.  Denies any current use.  Will maintain on CIWA protocol regardless.  DVT prophylaxis: Heparin Code Status: DNI, but not DNR Family Communication: None at bedside Disposition Plan:Admit for diuresis/paracentesis  Consultants:  GI  Procedures:  US paracentesis  Subjective: Pt reports  that he is feeling better, he started coughing after taking his morning meds and a chest xray was ordered.      Objective: Vitals:   05/31/18 2011 05/31/18 2122 06/01/18 0044 06/01/18 0500  BP:  106/68 120/87 112/71  Pulse:  78 83 85  Resp:  20  18  Temp:  98.2 F (36.8 C)  98.4 F (36.9 C)  TempSrc:  Oral  Oral  SpO2: 94% 95%  97%  Weight:    81.1 kg (178 lb 11.2 oz)  Height:        Intake/Output Summary (Last 24 hours) at 06/01/2018 0834 Last data filed at 06/01/2018 0500 Gross per 24 hour  Intake 1110 ml  Output 6700 ml  Net -5590 ml   Filed Weights   05/30/18 0601 05/31/18 0606 06/01/18 0500  Weight: 90 kg (198 lb 8 oz) 84.7 kg (186 lb 12.8 oz) 81.1 kg (178 lb 11.2 oz)   REVIEW OF SYSTEMS  As per history otherwise all reviewed and reported negative  Exam:  General exam: chronically ill appearing, older than stated age.  Respiratory system: shallow breathing.  No increased work of breathing. Cardiovascular system: S1 & S2 heard, RRR.  Gastrointestinal system: Abdomen is distended gravid, tympanitic. Normal bowel sounds heard. Central nervous system: Alert and oriented. No focal neurological deficits. Extremities: trace pretibial edema bilateral with erythema noted BLE consistent with cellulitis infection.  Data Reviewed: Basic Metabolic Panel: Recent Labs  Lab 05/29/18 2215 05/30/18 0554 05/31/18 0456 06/01/18 0420  NA 132* 133* 132* 134*  K 3.7 3.5 3.6 3.3*  CL 101  100 96* 97*  CO2 26 26 26 30   GLUCOSE 118* 107* 105* 130*  BUN 20 20 18 15   CREATININE 1.09 1.09 1.02 0.98  CALCIUM 8.0* 7.9* 8.1* 8.2*  MG  --   --   --  1.9   Liver Function Tests: Recent Labs  Lab 05/29/18 2215 05/30/18 0554 05/31/18 0456 06/01/18 0420  AST 57* 50* 44* 45*  ALT 30 28 26 21   ALKPHOS 76 69 67 66  BILITOT 2.4* 1.8* 2.2* 1.9*  PROT 7.0 6.8 6.9 6.8  ALBUMIN 2.3* 2.3* 2.5* 2.4*   No results for input(s): LIPASE, AMYLASE in the last 168 hours. Recent Labs  Lab  05/29/18 2215 05/31/18 0456  AMMONIA 43* 31   CBC: Recent Labs  Lab 05/29/18 2215 05/30/18 0554 05/31/18 0456 06/01/18 0420  WBC 12.9* 10.1 6.8 6.0  NEUTROABS 10.8*  --  5.2  --   HGB 9.1* 8.9* 9.0* 9.3*  HCT 26.0* 26.0* 26.3* 27.3*  MCV 98.9 99.2 99.2 98.6  PLT 106* 109* 123* 120*   Cardiac Enzymes: No results for input(s): CKTOTAL, CKMB, CKMBINDEX, TROPONINI in the last 168 hours. CBG (last 3)  No results for input(s): GLUCAP in the last 72 hours. Recent Results (from the past 240 hour(s))  Gram stain     Status: None   Collection Time: 05/30/18 12:43 PM  Result Value Ref Range Status   Specimen Description ASCITIC  Final   Special Requests NONE  Final   Gram Stain   Final    NO ORGANISMS SEEN WBC PRESENT, PREDOMINANTLY MONONUCLEAR CYTOSPIN SMEAR Performed at [facility], [address] [place], [place] [address]    Report Status 05/30/2018 FINAL  Final  Culture, body fluid-bottle     Status: None (Preliminary result)   Collection Time: 05/30/18 12:43 PM  Result Value Ref Range Status   Specimen Description ASCITIC  Final   Special Requests BOTTLES DRAWN AEROBIC AND ANAEROBIC 10CC EACH  Final   Culture   Final    NO GROWTH 2 DAYS Performed at [facility], [address] [place], [place] [address]    Report Status PENDING  Incomplete    Studies: US Paracentesis  Result Date: 05/30/2018 INDICATION: Symptomatic ascites EXAM: ULTRASOUND GUIDED THERAPEUTIC PARACENTESIS MEDICATIONS: None. COMPLICATIONS: None immediate. PROCEDURE: Informed written consent was obtained from the patient after a discussion of the risks, benefits and alternatives to treatment. A timeout was performed prior to the initiation of the procedure. Initial ultrasound scanning demonstrates a large amount of ascites within the right lower abdominal quadrant. The right lower abdomen was prepped and draped in the usual sterile fashion. 1% lidocaine was used for local anesthesia. After tiny skin nick a  19 gauge, 7-cm, Yueh catheter was introduced. An ultrasound image was saved for documentation purposes. The paracentesis was performed. the catheter was removed and a dressing was applied. The patient tolerated the procedure well without immediate post procedural complication. FINDINGS: A total of approximately 4 of clear straw-colored fluid was removed. Samples were sent to the laboratory as requested by the clinical team. IMPRESSION: Successful ultrasound-guided paracentesis yielding 4 liters of peritoneal fluid. Electronically Signed   By: Marnee Spring M.D.   On: 05/30/2018 13:29   Dg Chest Port 1 View  Result Date: 05/31/2018 CLINICAL DATA:  Cough after taking medicine. EXAM: PORTABLE CHEST 1 VIEW COMPARISON:  05/29/2018. FINDINGS: Cardiomegaly with mild pulmonary vascular prominence and bilateral interstitial prominence suggesting mild CHF. Mild bibasilar subsegmental atelectasis. No pleural effusion or pneumothorax.  IMPRESSION: 1. Cardiomegaly with mild pulmonary venous congestion bilateral interstitial prominence suggesting mild CHF. No pleural effusion. 2.  Low lung volumes with mild bibasilar atelectasis. Electronically Signed   By: Maisie Fus  Register   On: 05/31/2018 10:26   Scheduled Meds: . folic acid  1 mg Oral Daily  . furosemide  60 mg Intravenous Q12H  . heparin  5,000 Units Subcutaneous Q8H  . lactulose  20 g Oral BID  . multivitamin with minerals  1 tablet Oral Daily  . sodium chloride flush  3 mL Intravenous Q12H  . spironolactone  100 mg Oral Daily  . thiamine  100 mg Oral Daily   Continuous Infusions: . sodium chloride    .  ceFAZolin (ANCEF) IV Stopped (06/01/18 0000)   Principal Problem:   Ascites Active Problems:   Hepatic cirrhosis (HCC)   Alcohol abuse   Essential hypertension   Renal insufficiency   Anasarca   Cellulitis   Liver failure (HCC)  Time spent:   Standley Dakins, MD, FAAFP [facility] Pager [phone]  If 7PM-7AM, please contact  night-coverage [email or web address] Password [facility] 06/01/2018, 8:34 AM    LOS: 2 days

## 2018-06-01 NOTE — Discharge Instructions (Signed)
Follow with Primary MD [facility] and other consultants as instructed your Hospitalist MD  Please get a complete blood count and chemistry panel checked by your Primary MD at your next visit, and again as instructed by your Primary MD.  Get Medicines reviewed and adjusted: Please take all your medications with you for your next visit with your Primary MD  Laboratory/radiological data: Please request your Primary MD to go over all hospital tests and procedure/radiological results at the follow up, please ask your Primary MD to get all Hospital records sent to his/her office.  In some cases, they will be blood work, cultures and biopsy results pending at the time of your discharge. Please request that your primary care M.D. follows up on these results.  Also Note the following: If you experience worsening of your admission symptoms, develop shortness of breath, life threatening emergency, suicidal or homicidal thoughts you must seek medical attention immediately by calling 911 or calling your MD immediately  if symptoms less severe.  You must read complete instructions/literature along with all the possible adverse reactions/side effects for all the Medicines you take and that have been prescribed to you. Take any new Medicines after you have completely understood and accpet all the possible adverse reactions/side effects.   Do not drive when taking Pain medications or sleeping medications (Benzodaizepines)  Do not take more than prescribed Pain, Sleep and Anxiety Medications. It is not advisable to combine anxiety,sleep and pain medications without talking with your primary care practitioner  Special Instructions: If you have smoked or chewed Tobacco  in the last 2 yrs please stop smoking, stop any regular Alcohol  and or any Recreational drug use.  Wear Seat belts while driving.  Please note: You were cared for by a hospitalist during your hospital stay. Once you are discharged, your  primary care physician will handle any further medical issues. Please note that NO REFILLS for any discharge medications will be authorized once you are discharged, as it is imperative that you return to your primary care physician (or establish a relationship with a primary care physician if you do not have one) for your post hospital discharge needs so that they can reassess your need for medications and monitor your lab values.   Alcoholic Liver Disease Alcoholic liver disease happens when the liver does not work the way it should. The condition is caused by drinking too much alcohol for many years. Follow these instructions at home:  Do not drink alcohol.  Take medicines only as told by your doctor.  Take vitamins only as told by your doctor.  Follow any diet instructions that your doctor gave you. You may need to: ? Eat foods that have thiamine. These include whole-wheat cereals, pork, and raw vegetables. ? Eat foods that have folic acid. These include vegetables, fruits, meats, beans, nuts, and dairy foods. ? Eat foods that are high in carbohydrates. These include yogurt, beans, potatoes, and rice. Contact a doctor if:  You have a fever.  You are short of breath.  You have trouble breathing.  You have bright red blood in your poop (stool).  Your poop looks like tar.  You throw up (vomit) blood.  Your skin looks more yellow, pale, or dark.  You get headaches.  You have trouble thinking.  You have trouble balancing or walking. This information is not intended to replace advice given to you by your health care provider. Make sure you discuss any questions you have with your health  care provider. Document Released: 09/18/2009 Document Revised: 04/28/2016 Document Reviewed: 10/23/2014 Elsevier Interactive Patient Education  2018 [employer].   Ascites Ascites is a collection of excess fluid in the abdomen. Ascites can range from mild to severe. It can get worse without  treatment. What are the causes? Possible causes include:  Cirrhosis. This is the most common cause of ascites.  Infection or inflammation in the abdomen.  Cancer in the abdomen.  Heart failure.  Kidney disease.  Inflammation of the pancreas.  Clots in the veins of the liver.  What are the signs or symptoms? Signs and symptoms may include:  A feeling of fullness in your abdomen. This is common.  An increase in the size of your abdomen or your waist.  Swelling in your legs.  Swelling of the scrotum in men.  Difficulty breathing.  Abdominal pain.  Sudden weight gain.  If the condition is mild, you may not have symptoms. How is this diagnosed? To make a diagnosis, your health care provider will:  Ask about your medical history.  Perform a physical exam.  Order imaging tests, such as an ultrasound or CT scan of your abdomen.  How is this treated? Treatment depends on the cause of the ascites. It may include:  Taking a pill to make you urinate. This is called a water pill (diuretic pill).  Strictly reducing your salt (sodium) intake. Salt can cause extra fluid to be kept in the body, and this makes ascites worse.  Having a procedure to remove fluid from your abdomen (paracentesis).  Having a procedure to transfer fluid from your abdomen into a vein.  Having a procedure that connects two of the major veins within your liver and relieves pressure on your liver (TIPS procedure).  Ascites may go away or improve with treatment of the condition that caused it. Follow these instructions at home:  Keep track of your weight. To do this, weigh yourself at the same time every day and record your weight.  Keep track of how much you drink and any changes in the amount you urinate.  Follow any instructions that your health care provider gives you about how much to drink.  Try not to eat salty (high-sodium) foods.  Take medicines only as directed by your health care  provider.  Keep all follow-up visits as directed by your health care provider. This is important.  Report any changes in your health to your health care provider, especially if you develop new symptoms or your symptoms get worse. Contact a health care provider if:  Your gain more than 3 pounds in 3 days.  Your abdominal size or your waist size increases.  You have new swelling in your legs.  The swelling in your legs gets worse. Get help right away if:  You develop a fever.  You develop confusion.  You develop new or worsening difficulty breathing.  You develop new or worsening abdominal pain.  You develop new or worsening swelling in the scrotum (in men). This information is not intended to replace advice given to you by your health care provider. Make sure you discuss any questions you have with your health care provider. Document Released: 11/21/2005 Document Revised: 03/30/2016 Document Reviewed: 06/20/2014 Elsevier Interactive Patient Education  2018 [employer].   Cellulitis, Adult Cellulitis is a skin infection. The infected area is usually red and sore. This condition occurs most often in the arms and lower legs. It is very important to get treated for this condition. Follow  these instructions at home:  Take over-the-counter and prescription medicines only as told by your doctor.  If you were prescribed an antibiotic medicine, take it as told by your doctor. Do not stop taking the antibiotic even if you start to feel better.  Drink enough fluid to keep your pee (urine) clear or pale yellow.  Do not touch or rub the infected area.  Raise (elevate) the infected area above the level of your heart while you are sitting or lying down.  Place warm or cold wet cloths (warm or cold compresses) on the infected area. Do this as told by your doctor.  Keep all follow-up visits as told by your doctor. This is important. These visits let your doctor make sure your infection  is not getting worse. Contact a doctor if:  You have a fever.  Your symptoms do not get better after 1-2 days of treatment.  Your bone or joint under the infected area starts to hurt after the skin has healed.  Your infection comes back. This can happen in the same area or another area.  You have a swollen bump in the infected area.  You have new symptoms.  You feel ill and also have muscle aches and pains. Get help right away if:  Your symptoms get worse.  You feel very sleepy.  You throw up (vomit) or have watery poop (diarrhea) for a long time.  There are red streaks coming from the infected area.  Your red area gets larger.  Your red area turns darker. This information is not intended to replace advice given to you by your health care provider. Make sure you discuss any questions you have with your health care provider. Document Released: 05/09/2008 Document Revised: 04/28/2016 Document Reviewed: 09/30/2015 Elsevier Interactive Patient Education  2018 [employer] Inc.  Paracentesis, Care After Refer to this sheet in the next few weeks. These instructions provide you with information about caring for yourself after your procedure. Your health care provider may also give you more specific instructions. Your treatment has been planned according to current medical practices, but problems sometimes occur. Call your health care provider if you have any problems or questions after your procedure. What can I expect after the procedure? After your procedure, it is common to have a small amount of clear fluid coming from the puncture site. Follow these instructions at home:  Return to your normal activities as told by your health care provider. Ask your health care provider what activities are safe for you.  Take over-the-counter and prescription medicines only as told by your health care provider.  Do not take baths, swim, or use a hot tub until your health care provider  approves.  Follow instructions from your health care provider about: ? How to take care of your puncture site. ? When and how you should change your bandage (dressing). ? When you should remove your dressing.  Check your puncture area every day signs of infection. Watch for: ? Redness, swelling, or pain. ? Fluid, blood, or pus.  Keep all follow-up visits as told by your health care provider. This is important. Contact a health care provider if:  You have redness, swelling, or pain at your puncture site.  You start to have more clear fluid coming from your puncture site.  You have blood or pus coming from your puncture site.  You have chills.  You have a fever. Get help right away if:  You develop chest pain or shortness of breath.  You develop increasing pain, discomfort, or swelling in your abdomen.  You feel dizzy or light-headed or you pass out. This information is not intended to replace advice given to you by your health care provider. Make sure you discuss any questions you have with your health care provider. Document Released: 04/07/2015 Document Revised: 04/28/2016 Document Reviewed: 02/03/2015 Elsevier Interactive Patient Education  2018 [employer].   Peripheral Edema Peripheral edema is swelling that is caused by a buildup of fluid. Peripheral edema most often affects the lower legs, ankles, and feet. It can also develop in the arms, hands, and face. The area of the body that has peripheral edema will look swollen. It may also feel heavy or warm. Your clothes may start to feel tight. Pressing on the area may make a temporary dent in your skin. You may not be able to move your arm or leg as much as usual. There are many causes of peripheral edema. It can be a complication of other diseases, such as congestive heart failure, kidney disease, or a problem with your blood circulation. It also can be a side effect of certain medicines. It often happens to women during  pregnancy. Sometimes, the cause is not known. Treating the underlying condition is often the only treatment for peripheral edema. Follow these instructions at home: Pay attention to any changes in your symptoms. Take these actions to help with your discomfort:  Raise (elevate) your legs while you are sitting or lying down.  Move around often to prevent stiffness and to lessen swelling. Do not sit or stand for long periods of time.  Wear support stockings as told by your health care provider.  Follow instructions from your health care provider about limiting salt (sodium) in your diet. Sometimes eating less salt can reduce swelling.  Take over-the-counter and prescription medicines only as told by your health care provider. Your health care provider may prescribe medicine to help your body get rid of excess water (diuretic).  Keep all follow-up visits as told by your health care provider. This is important.  Contact a health care provider if:  You have a fever.  Your edema starts suddenly or is getting worse, especially if you are pregnant or have a medical condition.  You have swelling in only one leg.  You have increased swelling and pain in your legs. Get help right away if:  You develop shortness of breath, especially when you are lying down.  You have pain in your chest or abdomen.  You feel weak.  You faint. This information is not intended to replace advice given to you by your health care provider. Make sure you discuss any questions you have with your health care provider. Document Released: 12/29/2004 Document Revised: 04/25/2016 Document Reviewed: 06/03/2015 Elsevier Interactive Patient Education  [employer].

## 2018-06-01 NOTE — Care Management Note (Signed)
Case Management Note  Patient Details  Name: Derrick RossBarry K Demirjian MRN: [id number] Date of Birth: 09-26-1951  Subjective/Objective:       Admitted with ascites secondary to cirrhosis. Pt from home, ind pta. [facility]. Pt active with [facility].              Action/Plan: DC home today with resumption of HH services, aware HH has 48 hrs to make resumption visit. Olegario MessierKathy, [facility] aware of [facility]. [facility]   Expected Discharge Date:  06/01/18               Expected Discharge Plan:  Home w Home Health Services  In-House Referral:  NA  Discharge planning Services  CM Consult  Post Acute Care Choice:  Home Health Choice offered to:  Patient  DME Arranged:    DME Agency:     HH Arranged:  RN HH Agency:  [facility]  Status of Service:  Completed, signed off  If discussed at Long Length of Stay Meetings, dates discussed:    Additional Comments:  Malcolm MetroChildress, Fartun Paradiso Demske, RN 06/01/2018, 2:51 PM

## 2018-06-01 NOTE — Progress Notes (Signed)
Patient discharged home today per MD orders. Patient vital signs WDL. IV removed and site WDL. Discharge Instructions including follow up appointments, medications, and education reviewed with patient. Patient verbalizes understanding. Patient is transported out via wheelchair.  

## 2018-06-04 LAB — CULTURE, BODY FLUID-BOTTLE: CULTURE: NO GROWTH

## 2018-06-04 LAB — CULTURE, BODY FLUID W GRAM STAIN -BOTTLE

## 2018-06-20 ENCOUNTER — Inpatient Hospital Stay (HOSPITAL_COMMUNITY)
Admission: EM | Admit: 2018-06-20 | Discharge: 2018-06-27 | DRG: 603 | Disposition: A | Discharge status: Home Health | Payer: Medicare Other | Attending: Internal Medicine | Admitting: Internal Medicine

## 2018-06-20 ENCOUNTER — Encounter (HOSPITAL_COMMUNITY): Payer: Self-pay

## 2018-06-20 ENCOUNTER — Emergency Department (HOSPITAL_COMMUNITY): Payer: Medicare Other

## 2018-06-20 DIAGNOSIS — K7031 Alcoholic cirrhosis of liver with ascites: Secondary | ICD-10-CM | POA: Diagnosis not present

## 2018-06-20 DIAGNOSIS — L02415 Cutaneous abscess of right lower limb: Secondary | ICD-10-CM | POA: Diagnosis present

## 2018-06-20 DIAGNOSIS — L03116 Cellulitis of left lower limb: Secondary | ICD-10-CM | POA: Diagnosis present

## 2018-06-20 DIAGNOSIS — R509 Fever, unspecified: Secondary | ICD-10-CM | POA: Diagnosis not present

## 2018-06-20 DIAGNOSIS — Z23 Encounter for immunization: Secondary | ICD-10-CM

## 2018-06-20 DIAGNOSIS — J189 Pneumonia, unspecified organism: Secondary | ICD-10-CM

## 2018-06-20 DIAGNOSIS — I110 Hypertensive heart disease with heart failure: Secondary | ICD-10-CM | POA: Diagnosis present

## 2018-06-20 DIAGNOSIS — Z87891 Personal history of nicotine dependence: Secondary | ICD-10-CM

## 2018-06-20 DIAGNOSIS — Z7189 Other specified counseling: Secondary | ICD-10-CM

## 2018-06-20 DIAGNOSIS — R188 Other ascites: Secondary | ICD-10-CM

## 2018-06-20 DIAGNOSIS — I10 Essential (primary) hypertension: Secondary | ICD-10-CM | POA: Diagnosis present

## 2018-06-20 DIAGNOSIS — K7460 Unspecified cirrhosis of liver: Secondary | ICD-10-CM | POA: Diagnosis present

## 2018-06-20 DIAGNOSIS — L03115 Cellulitis of right lower limb: Secondary | ICD-10-CM | POA: Diagnosis not present

## 2018-06-20 DIAGNOSIS — F1010 Alcohol abuse, uncomplicated: Secondary | ICD-10-CM | POA: Diagnosis present

## 2018-06-20 DIAGNOSIS — Z79899 Other long term (current) drug therapy: Secondary | ICD-10-CM

## 2018-06-20 DIAGNOSIS — Z66 Do not resuscitate: Secondary | ICD-10-CM | POA: Diagnosis not present

## 2018-06-20 DIAGNOSIS — I509 Heart failure, unspecified: Secondary | ICD-10-CM | POA: Diagnosis present

## 2018-06-20 DIAGNOSIS — I878 Other specified disorders of veins: Secondary | ICD-10-CM | POA: Diagnosis present

## 2018-06-20 DIAGNOSIS — A419 Sepsis, unspecified organism: Secondary | ICD-10-CM

## 2018-06-20 DIAGNOSIS — I471 Supraventricular tachycardia: Secondary | ICD-10-CM | POA: Diagnosis not present

## 2018-06-20 DIAGNOSIS — Z515 Encounter for palliative care: Secondary | ICD-10-CM

## 2018-06-20 DIAGNOSIS — E876 Hypokalemia: Secondary | ICD-10-CM | POA: Diagnosis not present

## 2018-06-20 DIAGNOSIS — E871 Hypo-osmolality and hyponatremia: Secondary | ICD-10-CM

## 2018-06-20 DIAGNOSIS — K7290 Hepatic failure, unspecified without coma: Secondary | ICD-10-CM | POA: Diagnosis present

## 2018-06-20 LAB — CBC WITH DIFFERENTIAL/PLATELET
BASOS PCT: 0 %
Basophils Absolute: 0 10*3/uL (ref 0.0–0.1)
EOS ABS: 0 10*3/uL (ref 0.0–0.7)
Eosinophils Relative: 0 %
HEMATOCRIT: 27.5 % — AB (ref 39.0–52.0)
HEMOGLOBIN: 9.3 g/dL — AB (ref 13.0–17.0)
LYMPHS ABS: 0.7 10*3/uL (ref 0.7–4.0)
Lymphocytes Relative: 9 %
MCH: 34.3 pg — AB (ref 26.0–34.0)
MCHC: 33.8 g/dL (ref 30.0–36.0)
MCV: 101.5 fL — ABNORMAL HIGH (ref 78.0–100.0)
MONOS PCT: 8 %
Monocytes Absolute: 0.6 10*3/uL (ref 0.1–1.0)
NEUTROS ABS: 6.4 10*3/uL (ref 1.7–7.7)
NEUTROS PCT: 83 %
Platelets: 61 10*3/uL — ABNORMAL LOW (ref 150–400)
RBC: 2.71 MIL/uL — ABNORMAL LOW (ref 4.22–5.81)
RDW: 16.3 % — ABNORMAL HIGH (ref 11.5–15.5)
WBC: 7.7 10*3/uL (ref 4.0–10.5)

## 2018-06-20 LAB — COMPREHENSIVE METABOLIC PANEL
ALBUMIN: 2.3 g/dL — AB (ref 3.5–5.0)
ALK PHOS: 88 U/L (ref 38–126)
ALT: 27 U/L (ref 0–44)
AST: 45 U/L — AB (ref 15–41)
Anion gap: 7 (ref 5–15)
BUN: 11 mg/dL (ref 8–23)
CHLORIDE: 91 mmol/L — AB (ref 98–111)
CO2: 26 mmol/L (ref 22–32)
CREATININE: 1.02 mg/dL (ref 0.61–1.24)
Calcium: 7.7 mg/dL — ABNORMAL LOW (ref 8.9–10.3)
GFR calc Af Amer: >60 mL/min (ref >60)
GFR calc non Af Amer: >60 mL/min (ref >60)
GLUCOSE: 143 mg/dL — AB (ref 70–99)
Potassium: 3.8 mmol/L (ref 3.5–5.1)
SODIUM: 124 mmol/L — AB (ref 135–145)
Total Bilirubin: 5.1 mg/dL — ABNORMAL HIGH (ref 0.3–1.2)
Total Protein: 7.1 g/dL (ref 6.5–8.1)

## 2018-06-20 LAB — BODY FLUID CELL COUNT WITH DIFFERENTIAL
Eos, Fluid: 0 %
Lymphs, Fluid: 69 %
Monocyte-Macrophage-Serous Fluid: 18 % — ABNORMAL LOW (ref 50–90)
Neutrophil Count, Fluid: 13 % (ref 0–25)
WBC FLUID: 65 uL (ref 0–1000)

## 2018-06-20 LAB — GLUCOSE, PLEURAL OR PERITONEAL FLUID: Glucose, Fluid: 148 mg/dL

## 2018-06-20 LAB — PROTEIN, PLEURAL OR PERITONEAL FLUID: Total protein, fluid: <3 g/dL

## 2018-06-20 LAB — I-STAT CG4 LACTIC ACID, ED: Lactic Acid, Venous: 2.27 mmol/L (ref 0.5–1.9)

## 2018-06-20 MED ORDER — ALBUMIN HUMAN 25 % IV SOLN
50.0000 g | Freq: Once | INTRAVENOUS | Status: AC
  Administered 2018-06-20: 50 g via INTRAVENOUS
  Filled 2018-06-20: qty 200

## 2018-06-20 MED ORDER — ALBUMIN HUMAN 25 % IV SOLN
INTRAVENOUS | Status: AC
  Filled 2018-06-20: qty 50

## 2018-06-20 MED ORDER — FERROUS SULFATE 325 (65 FE) MG PO TABS
325.0000 mg | ORAL_TABLET | Freq: Every day | ORAL | Status: DC
  Administered 2018-06-21 – 2018-06-27 (×7): 325 mg via ORAL
  Filled 2018-06-20 (×7): qty 1

## 2018-06-20 MED ORDER — VANCOMYCIN HCL IN DEXTROSE 1-5 GM/200ML-% IV SOLN: 1000.0000 mg | Freq: Once | INTRAVENOUS | Status: DC

## 2018-06-20 MED ORDER — VANCOMYCIN HCL IN DEXTROSE 1-5 GM/200ML-% IV SOLN
1000.0000 mg | Freq: Two times a day (BID) | INTRAVENOUS | Status: DC
  Administered 2018-06-21: 1000 mg via INTRAVENOUS
  Filled 2018-06-20: qty 200

## 2018-06-20 MED ORDER — DOCUSATE SODIUM 100 MG PO CAPS
200.0000 mg | ORAL_CAPSULE | Freq: Two times a day (BID) | ORAL | Status: DC
  Administered 2018-06-20 – 2018-06-26 (×11): 200 mg via ORAL
  Filled 2018-06-20 (×13): qty 2

## 2018-06-20 MED ORDER — SODIUM CHLORIDE 0.9% FLUSH
3.0000 mL | Freq: Two times a day (BID) | INTRAVENOUS | Status: DC
  Administered 2018-06-20 – 2018-06-26 (×12): 3 mL via INTRAVENOUS

## 2018-06-20 MED ORDER — PIPERACILLIN-TAZOBACTAM 3.375 G IVPB
3.3750 g | Freq: Three times a day (TID) | INTRAVENOUS | Status: DC
  Administered 2018-06-20 – 2018-06-21 (×2): 3.375 g via INTRAVENOUS
  Filled 2018-06-20 (×2): qty 50

## 2018-06-20 MED ORDER — THIAMINE HCL 100 MG/ML IJ SOLN
100.0000 mg | Freq: Every day | INTRAMUSCULAR | Status: DC
  Filled 2018-06-20: qty 2

## 2018-06-20 MED ORDER — ALBUMIN HUMAN 25 % IV SOLN
INTRAVENOUS | Status: AC
  Filled 2018-06-20: qty 150

## 2018-06-20 MED ORDER — LORAZEPAM 2 MG/ML IJ SOLN: 0.0000 mg | Freq: Two times a day (BID) | INTRAMUSCULAR | Status: DC

## 2018-06-20 MED ORDER — POTASSIUM CHLORIDE CRYS ER 20 MEQ PO TBCR
20.0000 meq | EXTENDED_RELEASE_TABLET | Freq: Two times a day (BID) | ORAL | Status: DC
  Administered 2018-06-20 – 2018-06-27 (×14): 20 meq via ORAL
  Filled 2018-06-20 (×14): qty 1

## 2018-06-20 MED ORDER — LORAZEPAM 2 MG/ML IJ SOLN: 0.0000 mg | Freq: Four times a day (QID) | INTRAMUSCULAR | Status: DC

## 2018-06-20 MED ORDER — PIPERACILLIN-TAZOBACTAM 3.375 G IVPB 30 MIN
3.3750 g | Freq: Once | INTRAVENOUS | Status: AC
  Administered 2018-06-20: 3.375 g via INTRAVENOUS
  Filled 2018-06-20: qty 50

## 2018-06-20 MED ORDER — SPIRONOLACTONE 25 MG PO TABS
100.0000 mg | ORAL_TABLET | Freq: Every day | ORAL | Status: DC
  Administered 2018-06-21 – 2018-06-27 (×7): 100 mg via ORAL
  Filled 2018-06-20 (×3): qty 1
  Filled 2018-06-20: qty 4
  Filled 2018-06-20 (×3): qty 1

## 2018-06-20 MED ORDER — VITAMIN B-1 100 MG PO TABS
100.0000 mg | ORAL_TABLET | Freq: Every day | ORAL | Status: DC
  Administered 2018-06-20 – 2018-06-27 (×8): 100 mg via ORAL
  Filled 2018-06-20 (×9): qty 1

## 2018-06-20 MED ORDER — SODIUM CHLORIDE 0.9 % IV SOLN
250.0000 mL | INTRAVENOUS | Status: DC | PRN
  Administered 2018-06-25: 250 mL via INTRAVENOUS

## 2018-06-20 MED ORDER — ADULT MULTIVITAMIN W/MINERALS CH
1.0000 | ORAL_TABLET | Freq: Every day | ORAL | Status: DC
  Administered 2018-06-20 – 2018-06-27 (×8): 1 via ORAL
  Filled 2018-06-20 (×8): qty 1

## 2018-06-20 MED ORDER — LACTULOSE 10 GM/15ML PO SOLN
20.0000 g | Freq: Three times a day (TID) | ORAL | Status: DC
  Administered 2018-06-20 – 2018-06-27 (×20): 20 g via ORAL
  Filled 2018-06-20 (×19): qty 30

## 2018-06-20 MED ORDER — SODIUM CHLORIDE 0.9% FLUSH: 3.0000 mL | INTRAVENOUS | Status: DC | PRN

## 2018-06-20 MED ORDER — LORAZEPAM 1 MG PO TABS
1.0000 mg | ORAL_TABLET | Freq: Four times a day (QID) | ORAL | Status: AC | PRN
  Administered 2018-06-21 – 2018-06-22 (×2): 1 mg via ORAL
  Filled 2018-06-20 (×3): qty 1

## 2018-06-20 MED ORDER — BUMETANIDE 1 MG PO TABS
1.0000 mg | ORAL_TABLET | Freq: Two times a day (BID) | ORAL | Status: DC
  Administered 2018-06-20 – 2018-06-27 (×13): 1 mg via ORAL
  Filled 2018-06-20 (×14): qty 1

## 2018-06-20 MED ORDER — VITAMIN D (ERGOCALCIFEROL) 1.25 MG (50000 UNIT) PO CAPS
50000.0000 [IU] | ORAL_CAPSULE | ORAL | Status: DC
  Administered 2018-06-21: 50000 [IU] via ORAL
  Filled 2018-06-20: qty 1

## 2018-06-20 MED ORDER — IBUPROFEN 400 MG PO TABS
400.0000 mg | ORAL_TABLET | Freq: Once | ORAL | Status: DC
  Administered 2018-06-20: 400 mg via ORAL
  Filled 2018-06-20: qty 1

## 2018-06-20 MED ORDER — LORAZEPAM 2 MG/ML IJ SOLN: 1.0000 mg | Freq: Four times a day (QID) | INTRAMUSCULAR | Status: AC | PRN

## 2018-06-20 MED ORDER — ALBUMIN HUMAN 25 % IV SOLN
12.5000 g | Freq: Once | INTRAVENOUS | Status: DC
  Filled 2018-06-20: qty 50

## 2018-06-20 MED ORDER — FOLIC ACID 1 MG PO TABS
1.0000 mg | ORAL_TABLET | Freq: Every day | ORAL | Status: DC
  Administered 2018-06-21 – 2018-06-27 (×7): 1 mg via ORAL
  Filled 2018-06-20 (×7): qty 1

## 2018-06-20 MED ORDER — VITAMIN B-1 100 MG PO TABS
100.0000 mg | ORAL_TABLET | Freq: Every day | ORAL | Status: DC
Start: 2018-06-21 — End: 2018-06-20
  Filled 2018-06-20 (×2): qty 1

## 2018-06-20 MED ORDER — VITAMIN D 1000 UNITS PO TABS
1000.0000 [IU] | ORAL_TABLET | Freq: Every day | ORAL | Status: DC
  Administered 2018-06-21 – 2018-06-27 (×7): 1000 [IU] via ORAL
  Filled 2018-06-20 (×9): qty 1

## 2018-06-20 MED ORDER — SODIUM CHLORIDE 0.9 % IV SOLN
INTRAVENOUS | Status: DC
  Administered 2018-06-20: 18:00:00 via INTRAVENOUS

## 2018-06-20 MED ORDER — VANCOMYCIN HCL 10 G IV SOLR
1500.0000 mg | Freq: Once | INTRAVENOUS | Status: AC
  Administered 2018-06-20: 1500 mg via INTRAVENOUS
  Filled 2018-06-20: qty 1500
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
## 2018-06-20 NOTE — H&P (Signed)
History and Physical    MANOJ ENRIQUEZ NFA:[id number] DOB: 12/30/1950 DOA: 06/20/2018  [facility]P: [facility]  Patient coming home  Chief Complaint: Swelling  HPI: Derrick Oneill is a 67 y.o. male with medical history significant of alcoholic cirrhosis of liver, hypertension comes in with increased abdominal swelling and the need for paracentesis.  Patient reports that the [facility] is not efficient and setting up his paracentesis as needed.  He denies any fevers.  He denies any nausea vomiting diarrhea.  He continues to drink alcohol daily.  Patient came in short of breath was found to have a temperature of 100.3.  Dr. Hyacinth Meeker in the emergency department has stopped 8 L of fluid from his abdomen today and is referred him for admission for fever likely due to pneumonia with new infiltrate on chest x-ray.  Review of Systems: As per HPI otherwise 10 point review of systems negative.   Past Medical History:  Diagnosis Date  . CHF (congestive heart failure) (HCC)   . Cirrhosis (HCC)   . Hypertension   . Renal insufficiency 05/29/2018    Past Surgical History:  Procedure Laterality Date  . PARACENTESIS    . UMBILICAL HERNIA REPAIR       reports that he has never smoked. He has quit using smokeless tobacco. His smokeless tobacco use included snuff and chew. He reports that he drinks alcohol. He reports that he does not use drugs.  No Known Allergies  Family History  Problem Relation Age of Onset  . Cirrhosis Father        deceased age 16, etoh related    Prior to Admission medications   Medication Sig Start Date End Date Taking? Authorizing Provider  bumetanide (BUMEX) 1 MG tablet Take 1 mg by mouth 2 (two) times daily.   Yes [provider]  cholecalciferol (VITAMIN D) 1000 units tablet Take 1,000 Units by mouth daily.   Yes [provider]  clobetasol cream (TEMOVATE) 0.05 % Apply 1 application topically 2 (two) times daily as needed (Apply a thin  layer as needed for psoriasis).   Yes [provider]  docusate sodium (COLACE) 100 MG capsule Take 200 mg by mouth 2 (two) times daily.    Yes [provider]  ferrous sulfate 325 (65 FE) MG tablet Take 325 mg by mouth daily with breakfast.   Yes [provider]  folic acid (FOLVITE) 1 MG tablet Take 1 mg by mouth daily.   Yes [provider]  lactulose (CHRONULAC) 10 GM/15ML solution Take 20 g by mouth 3 (three) times daily.    Yes [provider]  potassium chloride SA (K-DUR,KLOR-CON) 20 MEQ tablet Take 20 mEq by mouth 2 (two) times daily.   Yes [provider]  spironolactone (ALDACTONE) 50 MG tablet Take 100 mg by mouth daily.   Yes [provider]  thiamine 100 MG tablet Take 100 mg by mouth daily.   Yes [provider]  Vitamin D, Ergocalciferol, (DRISDOL) 50000 units CAPS capsule Take 50,000 Units by mouth every 7 (seven) days.   Yes [provider]  Multiple Vitamin (MULTIVITAMIN WITH MINERALS) TABS tablet Take 1 tablet by mouth daily. 06/02/18   Cleora Fleet, MD    Physical Exam: Vitals:   06/20/18 1604 06/20/18 1630 06/20/18 1700 06/20/18 1730  BP: 123/76 104/61 115/63 103/62  Pulse: 95 64 62 63  Resp: (!) 24 19 18 19   Temp:      SpO2: 95%  94% 95% 94%  Weight:          Constitutional: NAD, calm, comfortable jaundiced Vitals:   06/20/18 1604 06/20/18 1630 06/20/18 1700 06/20/18 1730  BP: 123/76 104/61 115/63 103/62  Pulse: 95 64 62 63  Resp: (!) 24 19 18 19   Temp:      SpO2: 95% 94% 95% 94%  Weight:       Eyes: PERRL, lids and conjunctivae normal icteric sclera ENMT: Mucous membranes are moist. Posterior pharynx clear of any exudate or lesions.Normal dentition.  Neck: normal, supple, no masses, no thyromegaly Respiratory: clear to auscultation bilaterally, no wheezing, no crackles. Normal respiratory effort. No accessory muscle use.  Cardiovascular: Regular rate and rhythm, no  murmurs / rubs / gallops. No extremity edema. 2+ pedal pulses. No carotid bruits.  Abdomen: no tenderness, no masses palpated. No hepatosplenomegaly. Bowel sounds positive.  Moderate ascites Musculoskeletal: no clubbing / cyanosis. No joint deformity upper and lower extremities. Good ROM, no contractures. Normal muscle tone.  Skin: no rashes, lesions, ulcers. No induration Neurologic: CN 2-12 grossly intact. Sensation intact, DTR normal. Strength 5/5 in all 4.  Psychiatric: Normal judgment and insight. Alert and oriented x 3. Normal mood.    Labs on Admission: I have personally reviewed following labs and imaging studies  CBC: Recent Labs  Lab 06/20/18 1506  WBC 7.7  NEUTROABS 6.4  HGB 9.3*  HCT 27.5*  MCV 101.5*  PLT 61*   Basic Metabolic Panel: Recent Labs  Lab 06/20/18 1506  NA 124*  K 3.8  CL 91*  CO2 26  GLUCOSE 143*  BUN 11  CREATININE 1.02  CALCIUM 7.7*   GFR: Estimated Creatinine Clearance: 74.4 mL/min (by C-G formula based on SCr of 1.02 mg/dL). Liver Function Tests: Recent Labs  Lab 06/20/18 1506  AST 45*  ALT 27  ALKPHOS 88  BILITOT 5.1*  PROT 7.1  ALBUMIN 2.3*   No results for input(s): LIPASE, AMYLASE in the last 168 hours. No results for input(s): AMMONIA in the last 168 hours. Coagulation Profile: No results for input(s): INR, PROTIME in the last 168 hours. Cardiac Enzymes: No results for input(s): CKTOTAL, CKMB, CKMBINDEX, TROPONINI in the last 168 hours. BNP (last 3 results) No results for input(s): PROBNP in the last 8760 hours. HbA1C: No results for input(s): HGBA1C in the last 72 hours. CBG: No results for input(s): GLUCAP in the last 168 hours. Lipid Profile: No results for input(s): CHOL, HDL, LDLCALC, TRIG, CHOLHDL, LDLDIRECT in the last 72 hours. Thyroid Function Tests: No results for input(s): TSH, T4TOTAL, FREET4, T3FREE, THYROIDAB in the last 72 hours. Anemia Panel: No results for input(s): VITAMINB12, FOLATE, FERRITIN, TIBC,  IRON, RETICCTPCT in the last 72 hours. Urine analysis: No results found for: COLORURINE, APPEARANCEUR, LABSPEC, PHURINE, GLUCOSEU, HGBUR, BILIRUBINUR, KETONESUR, PROTEINUR, UROBILINOGEN, NITRITE, LEUKOCYTESUR Sepsis Labs: !!!!!!!!!!!!!!!!!!!!!!!!!!!!!!!!!!!!!!!!!!!! @LABRCNTIP (procalcitonin:4,lacticidven:4) ) Recent Results (from the past 240 hour(s))  Blood Culture (routine x 2)     Status: None (Preliminary result)   Collection Time: 06/20/18  3:06 PM  Result Value Ref Range Status   Specimen Description SITE NOT SPECIFIED  Final   Special Requests   Final    BOTTLES DRAWN AEROBIC AND ANAEROBIC Blood Culture adequate volume Performed at [facility], [address] [place], [place] [address]    Culture PENDING  Incomplete   Report Status PENDING  Incomplete  Blood Culture (routine x 2)     Status: None (Preliminary result)   Collection Time: 06/20/18  3:56 PM  Result  Value Ref Range Status   Specimen Description LEFT ANTECUBITAL  Final   Special Requests   Final    BOTTLES DRAWN AEROBIC AND ANAEROBIC Blood Culture adequate volume Performed at [facility], [address] [place], [place] [address]    Culture PENDING  Incomplete   Report Status PENDING  Incomplete  Gram stain     Status: None (Preliminary result)   Collection Time: 06/20/18  4:55 PM  Result Value Ref Range Status   Specimen Description PERITONEAL  Final   Special Requests NONE  Final   Gram Stain   Final    CYTOSPIN SMEAR NO ORGANISMS SEEN WBC PRESENT, PREDOMINANTLY MONONUCLEAR Performed at [facility], [address] [place], [place] [address]    Report Status PENDING  Incomplete     Radiological Exams on Admission: Dg Chest 2 View  Result Date: 06/20/2018 CLINICAL DATA:  Cough, shortness of breath EXAM: CHEST - 2 VIEW COMPARISON:  05/31/2018 FINDINGS: Trace left pleural effusion. Pulmonary vascular congestion. No frank interstitial edema, improved. No pneumothorax. The heart is top-normal in size.  IMPRESSION: Trace left pleural effusion. Pulmonary vascular congestion. No frank interstitial edema, improved. Electronically Signed   By: Charline BillsSriyesh  Krishnan M.D.   On: 06/20/2018 17:42    Old chart reviewed  Case discussed with Dr. [name] in the ED  Chest x-ray reviewed possible left developing infiltrate with small effusion  Assessment/Plan 67 year old male with end-stage cirrhosis of the liver due to alcohol abuse comes in with decompensation with 8 L of ascites drawn off today also found to have likely pneumonia Principal Problem:   Fever-likely from pneumonia.  Empirically cover on IV Vanco and Zosyn.  Obtain culture data and follow-up.  O2 sats normal.  Monitor overnight.  Will likely be able go home tomorrow on p.o. Antibiotic.  Active Problems:   Hepatic cirrhosis (HCC)-have talked to the patient about hospice he is really not interested in this.  [place] [place] [facility] [place] [place].    Alcohol abuse-noted    Essential hypertension-continue home meds   We will obtain palliative care consultation in order to establish goals of care due to his end-stage disease.  Patient is a good hosp[facility] candidate unsure [facility]   DVT prophylaxis: SCDs Code Status: Full Family Communication: None Disposition Plan: Likely tomorrow Consults called: Palliative care Admission status: Observation   Curran Lenderman A MD [facility]  If 7PM-7AM, please contact night-coverage [email or web address] Password [email or web address]  06/20/2018, 6:04 PM

## 2018-06-20 NOTE — ED Triage Notes (Addendum)
Pt has increase abdominal swelling and right leg swelling which has been increasing over the past several days. Reports difficulty breathing and coughing up mucous. Pt has a hx of cirrhosis. Pt denied still drinking, but sister reports he is still drinking

## 2018-06-20 NOTE — ED Notes (Signed)
ED Provider at bedside. 

## 2018-06-20 NOTE — Progress Notes (Addendum)
Pharmacy Note:  Initial antibiotic(s) regimen of Vancomycin and zosyn ordered by EDP to treat cellulitis.  Estimated Creatinine Clearance: 74.4 mL/min (by C-G formula based on SCr of 1.02 mg/dL).   No Known Allergies  Vitals:   06/20/18 1417  BP: 131/75  Pulse: 64  Resp: (!) 24  Temp: (!) 100.4 F (38 C)  SpO2: 93%    Anti-infectives (From admission, onward)   Start     Dose/Rate Route Frequency Ordered Stop   06/20/18 1600  vancomycin (VANCOCIN) 1,500 mg in sodium chloride 0.9 % 500 mL IVPB     1,500 mg 250 mL/hr over 120 Minutes Intravenous  Once 06/20/18 1530     06/20/18 1515  piperacillin-tazobactam (ZOSYN) IVPB 3.375 g     3.375 g 100 mL/hr over 30 Minutes Intravenous  Once 06/20/18 1508     06/20/18 1515  vancomycin (VANCOCIN) IVPB 1000 mg/200 mL premix  Status:  Discontinued     1,000 mg 200 mL/hr over 60 Minutes Intravenous  Once 06/20/18 1508 06/20/18 1530      Plan: Initial dose(s) of Vancomycin 1500mg  IV  X 1  And zosyn 3.375gm IV x1 ordered. F/U admission orders for further dosing if therapy continued.  Elder CyphersLorie Poole, BS Loura Backharm D, [place] Clinical Pharmacist Pager [phone] 06/20/2018 4:12 PM   Addendum: Continued on admission. Zosyn 3.375gm IV every 8 hours. Follow-up micro data, labs, vitals.  Vancomycin 1g IV every 12 hours. Goal Trough 15-20  Mady GemmaHayes, Garlene Apperson R, [facility] National HospitalRPH 06/20/2018 8:25 PM

## 2018-06-21 ENCOUNTER — Other Ambulatory Visit: Payer: Self-pay

## 2018-06-21 ENCOUNTER — Encounter (HOSPITAL_COMMUNITY): Payer: Self-pay | Admitting: Primary Care

## 2018-06-21 DIAGNOSIS — Z79899 Other long term (current) drug therapy: Secondary | ICD-10-CM | POA: Diagnosis not present

## 2018-06-21 DIAGNOSIS — Z515 Encounter for palliative care: Secondary | ICD-10-CM | POA: Diagnosis not present

## 2018-06-21 DIAGNOSIS — L03115 Cellulitis of right lower limb: Secondary | ICD-10-CM | POA: Diagnosis present

## 2018-06-21 DIAGNOSIS — I471 Supraventricular tachycardia: Secondary | ICD-10-CM | POA: Diagnosis not present

## 2018-06-21 DIAGNOSIS — I878 Other specified disorders of veins: Secondary | ICD-10-CM | POA: Diagnosis present

## 2018-06-21 DIAGNOSIS — I340 Nonrheumatic mitral (valve) insufficiency: Secondary | ICD-10-CM | POA: Diagnosis not present

## 2018-06-21 DIAGNOSIS — Z87891 Personal history of nicotine dependence: Secondary | ICD-10-CM | POA: Diagnosis not present

## 2018-06-21 DIAGNOSIS — I509 Heart failure, unspecified: Secondary | ICD-10-CM | POA: Diagnosis present

## 2018-06-21 DIAGNOSIS — I10 Essential (primary) hypertension: Secondary | ICD-10-CM | POA: Diagnosis not present

## 2018-06-21 DIAGNOSIS — F1010 Alcohol abuse, uncomplicated: Secondary | ICD-10-CM | POA: Diagnosis present

## 2018-06-21 DIAGNOSIS — K7290 Hepatic failure, unspecified without coma: Secondary | ICD-10-CM | POA: Diagnosis present

## 2018-06-21 DIAGNOSIS — Z7189 Other specified counseling: Secondary | ICD-10-CM

## 2018-06-21 DIAGNOSIS — L03116 Cellulitis of left lower limb: Secondary | ICD-10-CM | POA: Diagnosis present

## 2018-06-21 DIAGNOSIS — R509 Fever, unspecified: Secondary | ICD-10-CM | POA: Diagnosis not present

## 2018-06-21 DIAGNOSIS — I110 Hypertensive heart disease with heart failure: Secondary | ICD-10-CM | POA: Diagnosis present

## 2018-06-21 DIAGNOSIS — L02415 Cutaneous abscess of right lower limb: Secondary | ICD-10-CM | POA: Diagnosis present

## 2018-06-21 DIAGNOSIS — Z66 Do not resuscitate: Secondary | ICD-10-CM | POA: Diagnosis not present

## 2018-06-21 DIAGNOSIS — E871 Hypo-osmolality and hyponatremia: Secondary | ICD-10-CM | POA: Diagnosis present

## 2018-06-21 DIAGNOSIS — K7031 Alcoholic cirrhosis of liver with ascites: Secondary | ICD-10-CM | POA: Diagnosis present

## 2018-06-21 DIAGNOSIS — Z23 Encounter for immunization: Secondary | ICD-10-CM | POA: Diagnosis present

## 2018-06-21 DIAGNOSIS — E876 Hypokalemia: Secondary | ICD-10-CM | POA: Diagnosis not present

## 2018-06-21 LAB — GRAM STAIN

## 2018-06-21 LAB — CBC
HEMATOCRIT: 28.3 % — AB (ref 39.0–52.0)
HEMOGLOBIN: 9.5 g/dL — AB (ref 13.0–17.0)
MCH: 34.1 pg — ABNORMAL HIGH (ref 26.0–34.0)
MCHC: 33.6 g/dL (ref 30.0–36.0)
MCV: 101.4 fL — ABNORMAL HIGH (ref 78.0–100.0)
Platelets: 44 10*3/uL — ABNORMAL LOW (ref 150–400)
RBC: 2.79 MIL/uL — ABNORMAL LOW (ref 4.22–5.81)
RDW: 16.2 % — ABNORMAL HIGH (ref 11.5–15.5)
WBC: 5.6 10*3/uL (ref 4.0–10.5)

## 2018-06-21 LAB — BASIC METABOLIC PANEL
ANION GAP: 5 (ref 5–15)
BUN: 14 mg/dL (ref 8–23)
CHLORIDE: 96 mmol/L — AB (ref 98–111)
CO2: 29 mmol/L (ref 22–32)
Calcium: 7.5 mg/dL — ABNORMAL LOW (ref 8.9–10.3)
Creatinine, Ser: 1.04 mg/dL (ref 0.61–1.24)
GFR calc Af Amer: >60 mL/min (ref >60)
GLUCOSE: 152 mg/dL — AB (ref 70–99)
POTASSIUM: 3.3 mmol/L — AB (ref 3.5–5.1)
Sodium: 130 mmol/L — ABNORMAL LOW (ref 135–145)

## 2018-06-21 LAB — HEPATIC FUNCTION PANEL
ALBUMIN: 2.3 g/dL — AB (ref 3.5–5.0)
ALT: 20 U/L (ref 0–44)
AST: 34 U/L (ref 15–41)
Alkaline Phosphatase: 66 U/L (ref 38–126)
Bilirubin, Direct: 1.7 mg/dL — ABNORMAL HIGH (ref 0.0–0.2)
Indirect Bilirubin: 2.3 mg/dL — ABNORMAL HIGH (ref 0.3–0.9)
TOTAL PROTEIN: 6 g/dL — AB (ref 6.5–8.1)
Total Bilirubin: 4 mg/dL — ABNORMAL HIGH (ref 0.3–1.2)

## 2018-06-21 LAB — PATHOLOGIST SMEAR REVIEW

## 2018-06-21 MED ORDER — CYCLOBENZAPRINE HCL 10 MG PO TABS
5.0000 mg | ORAL_TABLET | Freq: Once | ORAL | Status: AC
  Administered 2018-06-21: 5 mg via ORAL
  Filled 2018-06-21: qty 1

## 2018-06-21 MED ORDER — CEFAZOLIN SODIUM-DEXTROSE 1-4 GM/50ML-% IV SOLN
1.0000 g | Freq: Three times a day (TID) | INTRAVENOUS | Status: DC
  Administered 2018-06-21 – 2018-06-23 (×6): 1 g via INTRAVENOUS
  Filled 2018-06-21 (×13): qty 50
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
## 2018-06-22 ENCOUNTER — Inpatient Hospital Stay (HOSPITAL_COMMUNITY): Payer: Medicare Other

## 2018-06-22 DIAGNOSIS — Z7189 Other specified counseling: Secondary | ICD-10-CM

## 2018-06-22 LAB — URINALYSIS, ROUTINE W REFLEX MICROSCOPIC
BACTERIA UA: NONE SEEN
BILIRUBIN URINE: NEGATIVE
Glucose, UA: NEGATIVE mg/dL
Ketones, ur: NEGATIVE mg/dL
LEUKOCYTES UA: NEGATIVE
Nitrite: NEGATIVE
Protein, ur: NEGATIVE mg/dL
SPECIFIC GRAVITY, URINE: 1.005 (ref 1.005–1.030)
pH: 5 (ref 5.0–8.0)

## 2018-06-22 LAB — BASIC METABOLIC PANEL
Anion gap: 6 (ref 5–15)
BUN: 12 mg/dL (ref 8–23)
CALCIUM: 7.4 mg/dL — AB (ref 8.9–10.3)
CO2: 31 mmol/L (ref 22–32)
CREATININE: 1 mg/dL (ref 0.61–1.24)
Chloride: 93 mmol/L — ABNORMAL LOW (ref 98–111)
Glucose, Bld: 94 mg/dL (ref 70–99)
Potassium: 3 mmol/L — ABNORMAL LOW (ref 3.5–5.1)
Sodium: 130 mmol/L — ABNORMAL LOW (ref 135–145)

## 2018-06-22 MED ORDER — ALBUMIN HUMAN 25 % IV SOLN: 12.5000 g | Freq: Once | INTRAVENOUS | Status: DC

## 2018-06-22 MED ORDER — CHLORHEXIDINE GLUCONATE 4 % EX LIQD
Freq: Two times a day (BID) | CUTANEOUS | Status: DC
  Administered 2018-06-22 – 2018-06-24 (×6): via TOPICAL
  Administered 2018-06-25: 1 via TOPICAL
  Administered 2018-06-25 – 2018-06-27 (×3): via TOPICAL
  Filled 2018-06-22 (×11): qty 15

## 2018-06-22 MED ORDER — POTASSIUM CHLORIDE CRYS ER 20 MEQ PO TBCR
40.0000 meq | EXTENDED_RELEASE_TABLET | Freq: Once | ORAL | Status: AC
  Administered 2018-06-22: 40 meq via ORAL
  Filled 2018-06-22: qty 2
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
## 2018-06-22 NOTE — Care Management Note (Addendum)
Case Management Note  Patient Details  Name: Derrick Oneill MRN: [id number] Date of Birth: 1951/03/04  Subjective/Objective:              Admitted with cellulits and ascites secondary to cirrhosis. Pt from home, ind pta. [facility]. Pt active with [facility] for nursing.  Palliative consulted and discussing GOC with patient.             Action/Plan: [facility] notified [facility]. Will fax DC summary when available.  DC home with resumption of home health. [facility]. Potential weekend DC. Will need HH RN resumption.   Expected Discharge Date:  06/21/18               Expected Discharge Plan:  Home w Home Health Services  In-House Referral:     Discharge planning Services  CM Consult, Palliative  Post Acute Care Choice:  Home Health, Resumption of Svcs/PTA Provider Choice offered to:     DME Arranged:    DME Agency:     HH Arranged:   RN HH Agency:  [facility]  Status of Service:  Completed, signed off  If discussed at Long Length of Stay Meetings, dates discussed:    Additional Comments:  Derrick Oneill, Derrick OilerSharley Diane, RN 06/22/2018, 12:49 PM

## 2018-06-22 NOTE — Care Management Important Message (Signed)
Important Message  Patient Details  Name: Derrick RossBarry K Oneill MRN: [id number] Date of Birth: December 01, 1951   Medicare Important Message Given:  Yes    Kirk Sampley, Chrystine OilerSharley Diane, RN 06/22/2018, 12:57 PM

## 2018-06-22 NOTE — Progress Notes (Signed)
Palliative: Return call from sister, Ronette Detereresa Manning.  We talked about Mr. Denyse AmassBolding's living conditions, acute on chronic illnesses.  I share my concern over Mr. Brummell's expected continued decline, how we will continue to care for him, what he does and does not want.  Rosey Batheresa shares that Mr. Lubertha SayresBoulding had hospice in past but was let go by the M because of multiple hospitalizations.  We talked about the goals of hospice, difficulty with hospitalizations due to goals of care.  Rosey Batheresa states that her brother is hospitalized every couple of weeks, this is due to paracentesis needs.  She said he was dismissed from the clinic where he had been having paracentesis, and [facility].    Chronic illness pathway discussed, what is normal and expected.  We talked about continued declines, aggressive behaviors, decreased ability to care for self.  Rosey Batheresa states that her brother is experiencing all of these things now.  Rosey Batheresa shares that one of his main concerns is his 2 dogs.  Mr. Lubertha SayresBoulding has been talking about giving his dogs away, this really surprised Rosey Batheresa.  She shares that Mr. Lubertha SayresBoulding called their mother recently and told her that he thought he was dying.   I ask how Mr. Dellie CatholicBoulden got to the hospital this time, did he or someone else call 911?  Rosey Batheresa states that she came by to check on her brother and convinced him to go to the hospital, he did not want to come.  We talked about the concept of let nature take its course, what does Mr. Lubertha SayresBoulding want this time to look like.  Rosey Batheresa suggests that PMT talk with son Apolinar JunesBrandon more, see what he can offer.  We plan for a family meeting on Monday at 10:30 AM if Mr. Lubertha SayresBoulding is still hospitalized.  I go to Mr. Twaddell's room for meeting.  He is alert and oriented today. We talk about his current health concerns, and I share that he does look more alert today.  We talked about his fluid buildup, he states that he has recently  had a tap, but continues to have leaking from the tap site.  We briefly discussed Pleurx, Mr. Lubertha SayresBoulding states would be agreeable to Pleurx drain if this is possible.   I share a diagram of the chronic illness pathway, what is normal and expected.  I encourage Mr. Lubertha SayresBoulding to consider what he wants when he gets sick again.  We talk about comfort and dignity, hospice referral.  Mr. Lubertha SayresBoulding is not ready to accept comfort only at this time. We talked about CODE STATUS.  [name] immediately states that he does want CPR.  We talked about the realities of CPR, I give statistics.  I share that his quality of life will definitely decrease if he were to survive CPR.  We talked about "treat the treatable".  I encouraged him to consider that if we are doing everything he possible with modern medicine to treat what is treatable and his heart still naturally stops, medical team recommends to not attempt resuscitation.  No further questions at this time, I share that I will continue to follow-up as needed. 65 minutes, extended time Lillia Carmelasha Ruweyda Macknight, NP Palliative Medicine Team Team Phone # [phone]

## 2018-06-23 LAB — CBC WITH DIFFERENTIAL/PLATELET
BASOS ABS: 0 10*3/uL (ref 0.0–0.1)
BASOS PCT: 0 %
Eosinophils Absolute: 0.2 10*3/uL (ref 0.0–0.7)
Eosinophils Relative: 3 %
HEMATOCRIT: 30.6 % — AB (ref 39.0–52.0)
HEMOGLOBIN: 10.5 g/dL — AB (ref 13.0–17.0)
Lymphocytes Relative: 16 %
Lymphs Abs: 1 10*3/uL (ref 0.7–4.0)
MCH: 34.4 pg — ABNORMAL HIGH (ref 26.0–34.0)
MCHC: 34.3 g/dL (ref 30.0–36.0)
MCV: 100.3 fL — ABNORMAL HIGH (ref 78.0–100.0)
MONOS PCT: 17 %
Monocytes Absolute: 1 10*3/uL (ref 0.1–1.0)
NEUTROS ABS: 3.9 10*3/uL (ref 1.7–7.7)
NEUTROS PCT: 64 %
PLATELETS: 69 10*3/uL — AB (ref 150–400)
RBC: 3.05 MIL/uL — AB (ref 4.22–5.81)
RDW: 15.6 % — AB (ref 11.5–15.5)
WBC: 6.1 10*3/uL (ref 4.0–10.5)

## 2018-06-23 LAB — COMPREHENSIVE METABOLIC PANEL
ALK PHOS: 67 U/L (ref 38–126)
ALT: 16 U/L (ref 0–44)
AST: 32 U/L (ref 15–41)
Albumin: 2.2 g/dL — ABNORMAL LOW (ref 3.5–5.0)
Anion gap: 5 (ref 5–15)
BUN: 11 mg/dL (ref 8–23)
CALCIUM: 7.6 mg/dL — AB (ref 8.9–10.3)
CO2: 32 mmol/L (ref 22–32)
Chloride: 93 mmol/L — ABNORMAL LOW (ref 98–111)
Creatinine, Ser: 0.89 mg/dL (ref 0.61–1.24)
Glucose, Bld: 89 mg/dL (ref 70–99)
Potassium: 3.4 mmol/L — ABNORMAL LOW (ref 3.5–5.1)
SODIUM: 130 mmol/L — AB (ref 135–145)
Total Bilirubin: 3.1 mg/dL — ABNORMAL HIGH (ref 0.3–1.2)
Total Protein: 6.1 g/dL — ABNORMAL LOW (ref 6.5–8.1)

## 2018-06-23 MED ORDER — SODIUM CHLORIDE 0.9 % IV SOLN
1500.0000 mg | Freq: Once | INTRAVENOUS | Status: AC
  Administered 2018-06-23: 1500 mg via INTRAVENOUS
  Filled 2018-06-23: qty 1500

## 2018-06-23 MED ORDER — VANCOMYCIN HCL IN DEXTROSE 1-5 GM/200ML-% IV SOLN
1000.0000 mg | Freq: Two times a day (BID) | INTRAVENOUS | Status: DC
  Administered 2018-06-23 – 2018-06-25 (×5): 1000 mg via INTRAVENOUS
  Filled 2018-06-23 (×5): qty 200

## 2018-06-23 NOTE — Progress Notes (Signed)
Pharmacy Antibiotic Note  Derrick Oneill is a 67 y.o. male admitted on 06/20/2018 with cellulitis.  Pharmacy has been consulted for vancomycin dosing.  Plan: Vancomycin 1.5gm iv x 1 dose then Vancomycin 1000mg  IV every 12 hours.  Goal trough 10-15 mcg/mL.  Following pharmacist to order vancomycin depending on renal fx and dosage adjustments.  Height: 5\' 8"  (172.7 cm) Weight: 184 lb 15.5 oz (83.9 kg) IBW/kg (Calculated) : 68.4  Temp (24hrs), Avg:98.4 F (36.9 C), Min:98.3 F (36.8 C), Max:98.6 F (37 C)  Recent Labs  Lab 06/20/18 1506 06/20/18 1536 06/21/18 0633 06/22/18 0455 06/23/18 0650  WBC 7.7  --  5.6  --  6.1  CREATININE 1.02  --  1.04 1.00 0.89  LATICACIDVEN  --  2.27*  --   --   --     Estimated Creatinine Clearance: 88.5 mL/min (by C-G formula based on SCr of 0.89 mg/dL).    No Known Allergies  Anti-infectives (From admission, onward)   Start     Dose/Rate Route Frequency Ordered Stop   06/23/18 2200  vancomycin (VANCOCIN) IVPB 1000 mg/200 mL premix     1,000 mg 200 mL/hr over 60 Minutes Intravenous Every 12 hours 06/23/18 1027     06/23/18 1000  vancomycin (VANCOCIN) 1,500 mg in sodium chloride 0.9 % 500 mL IVPB     1,500 mg 250 mL/hr over 120 Minutes Intravenous  Once 06/23/18 0958     06/21/18 1400  ceFAZolin (ANCEF) IVPB 1 g/50 mL premix  Status:  Discontinued     1 g 100 mL/hr over 30 Minutes Intravenous Every 8 hours 06/21/18 1312 06/23/18 0843   06/21/18 0500  vancomycin (VANCOCIN) IVPB 1000 mg/200 mL premix  Status:  Discontinued     1,000 mg 200 mL/hr over 60 Minutes Intravenous Every 12 hours 06/20/18 2027 06/21/18 1313   06/20/18 2200  piperacillin-tazobactam (ZOSYN) IVPB 3.375 g  Status:  Discontinued     3.375 g 12.5 mL/hr over 240 Minutes Intravenous Every 8 hours 06/20/18 2027 06/21/18 1312   06/20/18 1600  vancomycin (VANCOCIN) 1,500 mg in sodium chloride 0.9 % 500 mL IVPB     1,500 mg 250 mL/hr over 120 Minutes Intravenous  Once 06/20/18  1530 06/20/18 1824   06/20/18 1515  piperacillin-tazobactam (ZOSYN) IVPB 3.375 g     3.375 g 100 mL/hr over 30 Minutes Intravenous  Once 06/20/18 1508 06/20/18 1627   06/20/18 1515  vancomycin (VANCOCIN) IVPB 1000 mg/200 mL premix  Status:  Discontinued     1,000 mg 200 mL/hr over 60 Minutes Intravenous  Once 06/20/18 1508 06/20/18 1530      Antimicrobials this admission:  7/17 vancomycin once, then 7/20 vancomycin >>  7/17 zosyn >> 7/18 7/18 cefazolin >>    Microbiology results:  7/17 BCx: ngtd 7/17 Peritoneal Fluid from Peritoneal Cavity: ngtd   Thank you for allowing pharmacy to be a part of this patient's care.  Gerre PebblesGarrett Ndeye Tenorio 06/23/2018 10:27 AM
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
## 2018-06-24 LAB — CBC WITH DIFFERENTIAL/PLATELET
BASOS ABS: 0 10*3/uL (ref 0.0–0.1)
Basophils Relative: 1 %
EOS PCT: 2 %
Eosinophils Absolute: 0.1 10*3/uL (ref 0.0–0.7)
HCT: 31.3 % — ABNORMAL LOW (ref 39.0–52.0)
Hemoglobin: 10.7 g/dL — ABNORMAL LOW (ref 13.0–17.0)
LYMPHS PCT: 21 %
Lymphs Abs: 0.9 10*3/uL (ref 0.7–4.0)
MCH: 34 pg (ref 26.0–34.0)
MCHC: 34.2 g/dL (ref 30.0–36.0)
MCV: 99.4 fL (ref 78.0–100.0)
MONO ABS: 1.1 10*3/uL — AB (ref 0.1–1.0)
Monocytes Relative: 24 %
Neutro Abs: 2.3 10*3/uL (ref 1.7–7.7)
Neutrophils Relative %: 52 %
PLATELETS: 79 10*3/uL — AB (ref 150–400)
RBC: 3.15 MIL/uL — ABNORMAL LOW (ref 4.22–5.81)
RDW: 15.8 % — AB (ref 11.5–15.5)
WBC: 4.5 10*3/uL (ref 4.0–10.5)

## 2018-06-24 LAB — COMPREHENSIVE METABOLIC PANEL
ALT: 15 U/L (ref 0–44)
ANION GAP: 6 (ref 5–15)
AST: 36 U/L (ref 15–41)
Albumin: 2.1 g/dL — ABNORMAL LOW (ref 3.5–5.0)
Alkaline Phosphatase: 68 U/L (ref 38–126)
BUN: 11 mg/dL (ref 8–23)
CHLORIDE: 92 mmol/L — AB (ref 98–111)
CO2: 28 mmol/L (ref 22–32)
Calcium: 7.5 mg/dL — ABNORMAL LOW (ref 8.9–10.3)
Creatinine, Ser: 0.83 mg/dL (ref 0.61–1.24)
GFR calc Af Amer: >60 mL/min (ref >60)
Glucose, Bld: 104 mg/dL — ABNORMAL HIGH (ref 70–99)
POTASSIUM: 3.5 mmol/L (ref 3.5–5.1)
Sodium: 126 mmol/L — ABNORMAL LOW (ref 135–145)
Total Bilirubin: 3 mg/dL — ABNORMAL HIGH (ref 0.3–1.2)
Total Protein: 6.2 g/dL — ABNORMAL LOW (ref 6.5–8.1)

## 2018-06-24 LAB — PHOSPHORUS: PHOSPHORUS: 2.5 mg/dL (ref 2.5–4.6)

## 2018-06-24 LAB — MAGNESIUM: MAGNESIUM: 1.6 mg/dL — AB (ref 1.7–2.4)

## 2018-06-24 MED ORDER — MAGNESIUM SULFATE 4 GM/100ML IV SOLN
4.0000 g | Freq: Once | INTRAVENOUS | Status: AC
  Administered 2018-06-24: 4 g via INTRAVENOUS
  Filled 2018-06-24: qty 100

## 2018-06-24 MED ORDER — AMIODARONE HCL IN DEXTROSE 360-4.14 MG/200ML-% IV SOLN
30.0000 mg/h | INTRAVENOUS | Status: DC
  Administered 2018-06-24 – 2018-06-25 (×3): 30 mg/h via INTRAVENOUS
  Filled 2018-06-24: qty 200

## 2018-06-24 MED ORDER — AMIODARONE HCL IN DEXTROSE 360-4.14 MG/200ML-% IV SOLN
INTRAVENOUS | Status: AC
  Administered 2018-06-24: 30 mg/h
  Filled 2018-06-24: qty 200

## 2018-06-24 MED ORDER — ADENOSINE 6 MG/2ML IV SOLN
INTRAVENOUS | Status: AC
  Administered 2018-06-24: 12 mg
  Filled 2018-06-24: qty 4

## 2018-06-24 MED ORDER — ADENOSINE 6 MG/2ML IV SOLN
12.0000 mg | Freq: Once | INTRAVENOUS | Status: AC
  Administered 2018-06-24: 12 mg via INTRAVENOUS
  Filled 2018-06-24: qty 4

## 2018-06-24 MED ORDER — POLYVINYL ALCOHOL 1.4 % OP SOLN
1.0000 [drp] | OPHTHALMIC | Status: DC | PRN
  Administered 2018-06-24 – 2018-06-25 (×3): 1 [drp] via OPHTHALMIC
  Filled 2018-06-24: qty 15

## 2018-06-24 MED ORDER — AMIODARONE HCL IN DEXTROSE 360-4.14 MG/200ML-% IV SOLN
INTRAVENOUS | Status: AC
  Filled 2018-06-24: qty 200

## 2018-06-24 MED ORDER — AMIODARONE IV BOLUS ONLY 150 MG/100ML
INTRAVENOUS | Status: AC
  Filled 2018-06-24: qty 100

## 2018-06-24 MED ORDER — ADENOSINE 6 MG/2ML IV SOLN
6.0000 mg | Freq: Once | INTRAVENOUS | Status: DC
  Filled 2018-06-24: qty 2

## 2018-06-24 MED ORDER — AMIODARONE HCL IN DEXTROSE 360-4.14 MG/200ML-% IV SOLN
60.0000 mg/h | INTRAVENOUS | Status: AC
Start: 2018-06-24 — End: 2018-06-24

## 2018-06-24 MED ORDER — ADENOSINE 6 MG/2ML IV SOLN
INTRAVENOUS | Status: AC
Start: 2018-06-24 — End: 2018-06-24
  Filled 2018-06-24: qty 2

## 2018-06-24 NOTE — Progress Notes (Signed)
Patient alert and oriented and transferred to ICU.

## 2018-06-24 NOTE — Progress Notes (Signed)
Report from NT that heart rate was 150's with vitals this am.  Upon assessment apical pulse was 160.  Patient is asymptomatic.  EKG performed but HR had returned to 80's.  Dr. Sherryll BurgerShah notified via phone and new orders were given and carried out.  Nursing staff to continue to monitor.

## 2018-06-24 NOTE — Progress Notes (Signed)
While giving report to oncoming nurse pt's heart rate again went up to 150-160's captured by telemetry.  Upon assessment pt's apical pulse was in 160's.  Dr. Laural BenesJohnson notified via phone and he gave an order for and EKG.  Respiratory therapy called and made aware of the need for and EKG.  Dr. Laural BenesJohnson arrived to the room and patient's heart rate had begun to fluctuate down to the 80's and back up to 150's.  EKG done with Dr. Laural BenesJohnson at the bedside and he gave an order to give adenosine. Patient placed on defibrillator to be monitor and supplied gather.  Adenosine 6mg  given rapid IV push with Dr. Laural BenesJohnson at the bedside.  Patient remained alert and oriented during this process and denies symptoms of tachycardia.  Oncoming RN transferring patient to ICU via bed accompanied by Dr. Laural BenesJohnson.  Nursing staff to continue to monitor.
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
## 2018-06-25 ENCOUNTER — Inpatient Hospital Stay (HOSPITAL_COMMUNITY): Payer: Medicare Other

## 2018-06-25 ENCOUNTER — Encounter (HOSPITAL_COMMUNITY): Payer: Self-pay | Admitting: Cardiology

## 2018-06-25 DIAGNOSIS — I471 Supraventricular tachycardia: Secondary | ICD-10-CM

## 2018-06-25 DIAGNOSIS — I340 Nonrheumatic mitral (valve) insufficiency: Secondary | ICD-10-CM

## 2018-06-25 LAB — COMPREHENSIVE METABOLIC PANEL
ALBUMIN: 2.1 g/dL — AB (ref 3.5–5.0)
ALK PHOS: 70 U/L (ref 38–126)
ALT: 18 U/L (ref 0–44)
AST: 45 U/L — ABNORMAL HIGH (ref 15–41)
Anion gap: 5 (ref 5–15)
BILIRUBIN TOTAL: 2.2 mg/dL — AB (ref 0.3–1.2)
BUN: 10 mg/dL (ref 8–23)
CALCIUM: 7.5 mg/dL — AB (ref 8.9–10.3)
CO2: 33 mmol/L — ABNORMAL HIGH (ref 22–32)
CREATININE: 0.88 mg/dL (ref 0.61–1.24)
Chloride: 91 mmol/L — ABNORMAL LOW (ref 98–111)
GFR calc Af Amer: >60 mL/min (ref >60)
Glucose, Bld: 123 mg/dL — ABNORMAL HIGH (ref 70–99)
POTASSIUM: 3.6 mmol/L (ref 3.5–5.1)
SODIUM: 129 mmol/L — AB (ref 135–145)
TOTAL PROTEIN: 6.1 g/dL — AB (ref 6.5–8.1)

## 2018-06-25 LAB — CULTURE, BODY FLUID-BOTTLE: CULTURE: NO GROWTH

## 2018-06-25 LAB — CBC WITH DIFFERENTIAL/PLATELET
BASOS ABS: 0.1 10*3/uL (ref 0.0–0.1)
BASOS PCT: 1 %
EOS ABS: 0.1 10*3/uL (ref 0.0–0.7)
Eosinophils Relative: 2 %
HCT: 30.2 % — ABNORMAL LOW (ref 39.0–52.0)
Hemoglobin: 10.3 g/dL — ABNORMAL LOW (ref 13.0–17.0)
LYMPHS PCT: 14 %
Lymphs Abs: 0.7 10*3/uL (ref 0.7–4.0)
MCH: 34.1 pg — ABNORMAL HIGH (ref 26.0–34.0)
MCHC: 34.1 g/dL (ref 30.0–36.0)
MCV: 100 fL (ref 78.0–100.0)
Monocytes Absolute: 1.5 10*3/uL — ABNORMAL HIGH (ref 0.1–1.0)
Monocytes Relative: 30 %
Neutro Abs: 2.8 10*3/uL (ref 1.7–7.7)
Neutrophils Relative %: 53 %
Platelets: 83 10*3/uL — ABNORMAL LOW (ref 150–400)
RBC: 3.02 MIL/uL — AB (ref 4.22–5.81)
RDW: 16.1 % — ABNORMAL HIGH (ref 11.5–15.5)
WBC: 5.2 10*3/uL (ref 4.0–10.5)

## 2018-06-25 LAB — CULTURE, BLOOD (ROUTINE X 2)
Culture: NO GROWTH
Culture: NO GROWTH
SPECIAL REQUESTS: ADEQUATE
SPECIAL REQUESTS: ADEQUATE

## 2018-06-25 LAB — CULTURE, BODY FLUID W GRAM STAIN -BOTTLE

## 2018-06-25 LAB — MRSA PCR SCREENING: MRSA by PCR: POSITIVE — AB

## 2018-06-25 LAB — ECHOCARDIOGRAM COMPLETE
Height: 68 in
WEIGHTICAEL: 2797.2 [oz_av]

## 2018-06-25 LAB — MAGNESIUM: MAGNESIUM: 1.9 mg/dL (ref 1.7–2.4)

## 2018-06-25 MED ORDER — CHLORHEXIDINE GLUCONATE CLOTH 2 % EX PADS
6.0000 | MEDICATED_PAD | Freq: Every day | CUTANEOUS | Status: DC
  Administered 2018-06-25 – 2018-06-27 (×3): 6 via TOPICAL

## 2018-06-25 MED ORDER — MUPIROCIN 2 % EX OINT
1.0000 "application " | TOPICAL_OINTMENT | Freq: Two times a day (BID) | CUTANEOUS | Status: DC
  Administered 2018-06-25 – 2018-06-27 (×5): 1 via NASAL
  Filled 2018-06-25 (×3): qty 22

## 2018-06-25 MED ORDER — NADOLOL 20 MG PO TABS
20.0000 mg | ORAL_TABLET | Freq: Every day | ORAL | Status: DC
  Administered 2018-06-25 – 2018-06-27 (×3): 20 mg via ORAL
  Filled 2018-06-25 (×6): qty 1

## 2018-06-25 MED ORDER — MAGNESIUM SULFATE 2 GM/50ML IV SOLN
2.0000 g | Freq: Once | INTRAVENOUS | Status: AC
  Administered 2018-06-25: 2 g via INTRAVENOUS

## 2018-06-25 MED ORDER — PNEUMOCOCCAL VAC POLYVALENT 25 MCG/0.5ML IJ INJ
0.5000 mL | INJECTION | INTRAMUSCULAR | Status: AC
  Administered 2018-06-26: 0.5 mL via INTRAMUSCULAR
  Filled 2018-06-25: qty 0.5

## 2018-06-25 NOTE — Progress Notes (Signed)
*  PRELIMINARY RESULTS* Echocardiogram 2D Echocardiogram has been performed.  Jeryl Columbialliott, Mitzie Marlar 06/25/2018, 11:14 AM

## 2018-06-25 NOTE — Care Management Note (Signed)
Case Management Note  Patient Details  Name: Derrick Oneill MRN: [id number] Date of Birth: 1950/12/23  Subjective/Objective:                    Action/Plan:  Patient electing to have pleurx drain placed and go home with hospice services. [facility].  Discussed briefly with Cassandra of Hospice. Will send referral.   Expected Discharge Date:  06/21/18               Expected Discharge Plan:  Home w Hospice Care  In-House Referral:  Clinical Social Work, OrthoptistChaplain, Hospice / Palliative Care  Discharge planning Services  CM Consult  Post Acute Care Choice:  Home Health, Resumption of Svcs/PTA Provider Choice offered to:     DME Arranged:    DME Agency:     HH Arranged:    HH Agency:  [facility]  Status of Service:  Completed, signed off  If discussed at [employer] Length of Stay Meetings, dates discussed:    Additional Comments:  Jonte Shiller, Chrystine OilerSharley Diane, RN 06/25/2018, 3:12 PM

## 2018-06-25 NOTE — Clinical Social Work Note (Signed)
Received new consult requesting contact with pt's son at his request. Spoke with pt's son by phone to discuss his concerns. Son mentioned that he and pt's sisters spoke with Tasha from Palliative as well.   Spoke with Ardis RowanAPNP, Tasha, to update. Tasha planning to speak with MD about possible plurex cath and [facility] or Hospice depending on which is most appropriate. Rodney Boozeasha will also speak with pt's RN CM to update.  Will follow up tomorrow to assist if needed.

## 2018-06-25 NOTE — Care Management Important Message (Signed)
Important Message  Patient Details  Name: Derrick RossBarry K Eisenhuth MRN: [id number] Date of Birth: [birth year]   Medicare Important Message Given:  Yes    Renie OraHawkins, Chistina Roston Smith 06/25/2018, 12:45 PM

## 2018-06-25 NOTE — Care Management (Signed)
Patient Name Derrick Oneill, Derrick Oneill ([id number]) Sex Male DOB 1951/07/23  Room Bed  IC05 IC05-01  Patient Demographics   Address [address] [place] [address] Phone [phone] (Home)  Patient Ethnicity & Race   Ethnic Group Patient Race  Not Hispanic or Latino White or Caucasian  Emergency Contact(s)   Name Relation Home Work Mobile  GalatiaBoulding,Brandon Son [phone]    Billiter,Tabitha Daughter [phone]    Helane RimaMANNING,TERESA Sister   [phone]  Documents on File    Status Date Received Description  Documents for the Patient  [place] HIPAA NOTICE OF PRIVACY - Scanned Not Received    [facility] E-Signature HIPAA Notice of Privacy Signed 05/29/18   Driver's License Not Received  [place] dl  Insurance Card Not Received    Advance Directives/Living Will/HCPOA/POA Not Received    Other Photo ID Not Received    Advance Directives/Living Will/HCPOA/POA  03/04/16   Advance Directives/Living Will/HCPOA/POA  03/04/16   HIM ROI Authorization  03/10/16 Patient Accounting / Crawford Givensuby J McClinton  HIM ROI Authorization  08/16/16 Patient Accounting / Crawford Givensuby J McClinton  Consent Form     HIM ROI Authorization  06/19/18 Patient Accounting / Crawford Givensuby J McClinton  HIM ROI Authorization  06/22/18 Patient Accounting / Crawford Givensuby J McClinton  Patient Photo   Photo of Patient  HIM Release of Information Output  06/19/18 Requested records  HIM Release of Information Output  06/22/18 Requested records  Documents for the Encounter  AOB (Assignment of Insurance Benefits) Not Received    E-signature AOB Signed 06/20/18   MEDICARE RIGHTS Received 06/22/18 Medicare Rights  E-signature Medicare Rights     ED Patient Billing Extract   ED PB Billing Extract  Cardiac Monitoring Strip Received 06/20/18   Cardiac Monitoring Strip - Scanned Received 06/21/18   Cardiac Monitoring Strip Shift Summary Received 06/24/18   EKG Received 06/21/18   Admission Information   Attending Provider Admitting Provider Admission Type  Admission Date/Time  Eddie Northhungel, Nishant, MD Haydee Monicaavid, Rachal A, MD Emergency 06/20/18 1420  Discharge Date Hospital Service Auth/Cert Status Service Area   Internal Medicine Incomplete [facility]  Unit Room/Bed Admission Status   AP-ICCUP NURSING IC05/IC05-01 Admission (Confirmed)   Admission   Complaint  FLUID BUILDUP  Hospital Account   Name Acct ID Class Status Primary Coverage  Derrick Oneill, Derrick Oneill [id number] Inpatient Open MEDICARE - MEDICARE PART A AND B      Guarantor Account (for Hospital Account [id number])   Name Relation to Pt Service Area Active? Acct Type  Derrick Oneill, Derrick Oneill Self CHSA Yes Personal/Family  Address Phone    [address] [place], [place] [address] [phone](H)        Coverage Information (for Hospital Account [id number])   F/O Payor/Plan Precert #  MEDICARE/MEDICARE PART A AND B   Subscriber Subscriber #  Derrick Oneill, Derrick Oneill [id number]  Address Phone  [address] [place] [id number]
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
## 2018-06-25 NOTE — Care Management (Signed)
Per Cassandra of Hospice, they will admit patient again to their services. They plan to do an in home intake when patient is discharged.
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
## 2018-06-25 NOTE — Progress Notes (Signed)
PROGRESS NOTE                                                                                                                                                                                                             Patient Demographics:    Derrick Oneill, is a 67 y.o. male, DOB - August 21, 1951, EAV:[id number]  Admit date - 06/20/2018   Admitting Physician Haydee Monica, MD  Outpatient Primary MD for the patient is [facility]  LOS - 4  Outpatient Specialists: None  Chief Complaint  Patient presents with  . Ascites       Brief Narrative   67 year old male with alcoholic liver cirrhosis, hypertension who presented to the ED with increasing abdominal distention needing paracentesis and had 8 L fluid removed in the ED.  He was also found to have bilateral leg cellulitis.  During hospital stay he developed SVT not resolved with vagal maneuvers followed by admission and required amiodarone infusion and monitored in ICU.   Subjective:   Patient reports increasing abdominal distention but feels fine otherwise.  Heart rate improved on amiodarone drip.   Assessment  & Plan :    Principal Problem: Supraventricular tachycardia Placed on amiodarone drip after at nursing did not control his heart rate.  Pain or palpitations this morning.  Cardiology consult appreciated this morning and stopped amiodarone and started low-dose nadolol. 2D echo with EF of 50-60% and no wall motion abnormality.   Active Problems: Bilateral lower leg cellulitis On empiric IV vancomycin with improvement.  Small skin abscess on the right thigh which was discussed with surgeon who recommended continue IV antibiotic and topical care with warm compress.  Remains afebrile.  Decompensated liver cirrhosis with ascites Frequent hospital visits for paracentesis.  Had 8 L ascites fluid removed this admission and now reaccumulating.  No signs of  SBP. Palliative care consult appreciated.  Overall prognosis is guarded.  Patient wishes to go home with hospice services ([facility] who [facility] followed the patient).  He wished to have a Pleurx drain placed.  IR consult placed.  Goals of care discussion by palliative care.  Patient so far wished for full scope of treatment.  Will discuss again tomorrow.  Hypokalemia Replenished  Chronic hyponatremia Secondary to hypervolemia with liver disease.  Essential hypertension Stable.  Chronic alcohol abuse  No signs of withdrawal.  Monitor on CIWA.     Code Status : Full code  Family Communication  : None at bedside  Disposition Plan  : Home with hospice possibly in the next 48 hours if continues to improve and Pleurx catheter placed in   Barriers For Discharge : Active symptoms  Consults  : Cardiology, palliative care  Procedures  : Paracentesis, 2D echo  DVT Prophylaxis  : SCDs  Lab Results  Component Value Date   PLT 83 (L) 06/25/2018    Antibiotics  :    Anti-infectives (From admission, onward)   Start     Dose/Rate Route Frequency Ordered Stop   06/23/18 2200  vancomycin (VANCOCIN) IVPB 1000 mg/200 mL premix     1,000 mg 200 mL/hr over 60 Minutes Intravenous Every 12 hours 06/23/18 1027     06/23/18 1000  vancomycin (VANCOCIN) 1,500 mg in sodium chloride 0.9 % 500 mL IVPB     1,500 mg 250 mL/hr over 120 Minutes Intravenous  Once 06/23/18 0958 06/23/18 1241   06/21/18 1400  ceFAZolin (ANCEF) IVPB 1 g/50 mL premix  Status:  Discontinued     1 g 100 mL/hr over 30 Minutes Intravenous Every 8 hours 06/21/18 1312 06/23/18 0843   06/21/18 0500  vancomycin (VANCOCIN) IVPB 1000 mg/200 mL premix  Status:  Discontinued     1,000 mg 200 mL/hr over 60 Minutes Intravenous Every 12 hours 06/20/18 2027 06/21/18 1313   06/20/18 2200  piperacillin-tazobactam (ZOSYN) IVPB 3.375 g  Status:  Discontinued     3.375 g 12.5 mL/hr over 240 Minutes Intravenous Every  8 hours 06/20/18 2027 06/21/18 1312   06/20/18 1600  vancomycin (VANCOCIN) 1,500 mg in sodium chloride 0.9 % 500 mL IVPB     1,500 mg 250 mL/hr over 120 Minutes Intravenous  Once 06/20/18 1530 06/20/18 1824   06/20/18 1515  piperacillin-tazobactam (ZOSYN) IVPB 3.375 g     3.375 g 100 mL/hr over 30 Minutes Intravenous  Once 06/20/18 1508 06/20/18 1627   06/20/18 1515  vancomycin (VANCOCIN) IVPB 1000 mg/200 mL premix  Status:  Discontinued     1,000 mg 200 mL/hr over 60 Minutes Intravenous  Once 06/20/18 1508 06/20/18 1530        Objective:   Vitals:   06/25/18 0600 06/25/18 0645 06/25/18 0800 06/25/18 1700  BP: 103/68 96/66    Pulse: 71 66    Resp: 14 16    Temp:   98.7 F (37.1 C) 98.6 F (37 C)  TempSrc:   Oral Oral  SpO2: 97% 100%    Weight:      Height:        Wt Readings from Last 3 Encounters:  06/25/18 79.3 kg (174 lb 13.2 oz)  06/01/18 81.1 kg (178 lb 11.2 oz)  03/03/16 90.7 kg (199 lb 14.4 oz)     Intake/Output Summary (Last 24 hours) at 06/25/2018 1858 Last data filed at 06/25/2018 1043 Gross per 24 hour  Intake 3290.89 ml  Output 1725 ml  Net 1565.89 ml     Physical Exam  Gen: Appears fatigued, not in distress HEENT: Pallor present, moist mucosa, supple neck Chest: clear b/l, no added sounds CVS: N S1&S2, no murmurs, GI: Severely distended with ascites, nontender Musculoskeletal: warm, erythema over bilateral leg without any discharge CNS: Alert and oriented, no tremors    Data Review:    CBC Recent Labs  Lab 06/20/18 1506 06/21/18 0633 06/23/18 0650 06/24/18 0830 06/25/18 0404  WBC  7.7 5.6 6.1 4.5 5.2  HGB 9.3* 9.5* 10.5* 10.7* 10.3*  HCT 27.5* 28.3* 30.6* 31.3* 30.2*  PLT 61* 44* 69* 79* 83*  MCV 101.5* 101.4* 100.3* 99.4 100.0  MCH 34.3* 34.1* 34.4* 34.0 34.1*  MCHC 33.8 33.6 34.3 34.2 34.1  RDW 16.3* 16.2* 15.6* 15.8* 16.1*  LYMPHSABS 0.7  --  1.0 0.9 0.7  MONOABS 0.6  --  1.0 1.1* 1.5*  EOSABS 0.0  --  0.2 0.1 0.1  BASOSABS  0.0  --  0.0 0.0 0.1    Chemistries  Recent Labs  Lab 06/20/18 1506 06/21/18 0633 06/22/18 0455 06/23/18 0650 06/24/18 0754 06/24/18 0830 06/25/18 0404  NA 124* 130* 130* 130*  --  126* 129*  K 3.8 3.3* 3.0* 3.4*  --  3.5 3.6  CL 91* 96* 93* 93*  --  92* 91*  CO2 26 29 31  32  --  28 33*  GLUCOSE 143* 152* 94 89  --  104* 123*  BUN 11 14 12 11   --  11 10  CREATININE 1.02 1.04 1.00 0.89  --  0.83 0.88  CALCIUM 7.7* 7.5* 7.4* 7.6*  --  7.5* 7.5*  MG  --   --   --   --  1.6*  --  1.9  AST 45* 34  --  32  --  36 45*  ALT 27 20  --  16  --  15 18  ALKPHOS 88 66  --  67  --  68 70  BILITOT 5.1* 4.0*  --  3.1*  --  3.0* 2.2*   ------------------------------------------------------------------------------------------------------------------ No results for input(s): CHOL, HDL, LDLCALC, TRIG, CHOLHDL, LDLDIRECT in the last 72 hours.  Lab Results  Component Value Date   HGBA1C 5.4 02/29/2016   ------------------------------------------------------------------------------------------------------------------ No results for input(s): TSH, T4TOTAL, T3FREE, THYROIDAB in the last 72 hours.  Invalid input(s): FREET3 ------------------------------------------------------------------------------------------------------------------ No results for input(s): VITAMINB12, FOLATE, FERRITIN, TIBC, IRON, RETICCTPCT in the last 72 hours.  Coagulation profile No results for input(s): INR, PROTIME in the last 168 hours.  No results for input(s): DDIMER in the last 72 hours.  Cardiac Enzymes No results for input(s): CKMB, TROPONINI, MYOGLOBIN in the last 168 hours.  Invalid input(s): CK ------------------------------------------------------------------------------------------------------------------ No results found for: BNP  Inpatient Medications  Scheduled Meds: . adenosine (ADENOCARD) IV  6 mg Intravenous Once  . bumetanide  1 mg Oral BID  . chlorhexidine   Topical BID  . Chlorhexidine  Gluconate Cloth  6 each Topical Q0600  . cholecalciferol  1,000 Units Oral Daily  . docusate sodium  200 mg Oral BID  . ferrous sulfate  325 mg Oral Q breakfast  . folic acid  1 mg Oral Daily  . lactulose  20 g Oral TID  . multivitamin with minerals  1 tablet Oral Daily  . mupirocin ointment  1 application Nasal BID  . nadolol  20 mg Oral Daily  . potassium chloride SA  20 mEq Oral BID  . sodium chloride flush  3 mL Intravenous Q12H  . spironolactone  100 mg Oral Daily  . thiamine  100 mg Oral Daily   Or  . thiamine  100 mg Intravenous Daily  . Vitamin D (Ergocalciferol)  50,000 Units Oral Q7 days   Continuous Infusions: . sodium chloride 250 mL (06/25/18 0806)  . vancomycin 1,000 mg (06/25/18 1040)   PRN Meds:.sodium chloride, polyvinyl alcohol, sodium chloride flush  Micro Results Recent Results (from the past 240 hour(s))  Blood Culture (  routine x 2)     Status: None   Collection Time: 06/20/18  3:06 PM  Result Value Ref Range Status   Specimen Description SITE NOT SPECIFIED  Final   Special Requests   Final    BOTTLES DRAWN AEROBIC AND ANAEROBIC Blood Culture adequate volume   Culture   Final    NO GROWTH 5 DAYS Performed at [facility], [address], [place], [place] [address]    Report Status 06/25/2018 FINAL  Final  Blood Culture (routine x 2)     Status: None   Collection Time: 06/20/18  3:56 PM  Result Value Ref Range Status   Specimen Description LEFT ANTECUBITAL  Final   Special Requests   Final    BOTTLES DRAWN AEROBIC AND ANAEROBIC Blood Culture adequate volume   Culture   Final    NO GROWTH 5 DAYS Performed at [facility], [address] [place], [place] [address]    Report Status 06/25/2018 FINAL  Final  Culture, body fluid-bottle     Status: None   Collection Time: 06/20/18  4:52 PM  Result Value Ref Range Status   Specimen Description PERITONEAL  Final   Special Requests BOTTLES DRAWN AEROBIC AND ANAEROBIC 10CC  Final   Culture   Final    NO  GROWTH 5 DAYS Performed at [facility], [address] [place], [place] [address]    Report Status 06/25/2018 FINAL  Final  Gram stain     Status: None   Collection Time: 06/20/18  4:55 PM  Result Value Ref Range Status   Specimen Description PERITONEAL  Final   Special Requests NONE  Final   Gram Stain   Final    CYTOSPIN SMEAR NO ORGANISMS SEEN WBC PRESENT, PREDOMINANTLY MONONUCLEAR Performed at [facility], [address] [place], [place] [address]    Report Status 06/21/2018 FINAL  Final  MRSA PCR Screening     Status: Abnormal   Collection Time: 06/25/18  6:00 AM  Result Value Ref Range Status   MRSA by PCR POSITIVE (A) NEGATIVE Final    Comment:        The GeneXpert MRSA Assay (FDA approved for NASAL specimens only), is one component of a comprehensive MRSA colonization surveillance program. It is not intended to diagnose MRSA infection nor to guide or monitor treatment for MRSA infections. RESULT CALLED TO, READ BACK BY AND VERIFIED WITH: SMART M. AT [id number] ON [id number] BY THOMPSON S. Performed at [facility], [address], [place], [place] [address]     Radiology Reports Dg Chest 2 View  Result Date: 06/20/2018 CLINICAL DATA:  Cough, shortness of breath EXAM: CHEST - 2 VIEW COMPARISON:  05/31/2018 FINDINGS: Trace left pleural effusion. Pulmonary vascular congestion. No frank interstitial edema, improved. No pneumothorax. The heart is top-normal in size. IMPRESSION: Trace left pleural effusion. Pulmonary vascular congestion. No frank interstitial edema, improved. Electronically Signed   By: Charline Bills M.D.   On: 06/20/2018 17:42   Dg Chest 2 View  Result Date: 05/29/2018 CLINICAL DATA:  67 y/o M; bilateral leg pain and swelling. Shortness of breath. EXAM: CHEST - 2 VIEW COMPARISON:  04/17/2017 chest radiograph. FINDINGS: Stable cardiac silhouette within normal limits given projection and technique. Low lung volumes accentuate pulmonary markings. Pulmonary  vascular congestion and probable mild interstitial edema. Bibasilar platelike atelectasis. No pleural effusion or pneumothorax. No acute osseous abnormality identified should IMPRESSION: Low lung volumes. Minor bibasilar atelectasis and probable mild interstitial edema. Electronically Signed  By: Mitzi Hansen M.D.   On: 05/29/2018 23:00   US Abdomen Limited  Result Date: 06/01/2018 INDICATION: Ascites. EXAM: ULTRASOUND GUIDED PARACENTESIS MEDICATIONS: None. ANESTHESIA/SEDATION: None. COMPLICATIONS: None. PROCEDURE: Ultrasound was performed and revealed only a tiny amount of ascites. Paracentesis not performed at this time due to small amount of intra-abdominal fluid. Repeat attempt can be obtained as needed. FINDINGS: A paracentesis not performed due to low volume of fluid. IMPRESSION: Paracentesis not performed due to low volume of fluid. Follow-up exam can be obtained as needed. Electronically Signed   By: Maisie Fus  Register   On: 06/01/2018 14:04   US Paracentesis  Result Date: 05/30/2018 INDICATION: Symptomatic ascites EXAM: ULTRASOUND GUIDED THERAPEUTIC PARACENTESIS MEDICATIONS: None. COMPLICATIONS: None immediate. PROCEDURE: Informed written consent was obtained from the patient after a discussion of the risks, benefits and alternatives to treatment. A timeout was performed prior to the initiation of the procedure. Initial ultrasound scanning demonstrates a large amount of ascites within the right lower abdominal quadrant. The right lower abdomen was prepped and draped in the usual sterile fashion. 1% lidocaine was used for local anesthesia. After tiny skin nick a 19 gauge, 7-cm, Yueh catheter was introduced. An ultrasound image was saved for documentation purposes. The paracentesis was performed. the catheter was removed and a dressing was applied. The patient tolerated the procedure well without immediate post procedural complication. FINDINGS: A total of approximately 4 of clear  straw-colored fluid was removed. Samples were sent to the laboratory as requested by the clinical team. IMPRESSION: Successful ultrasound-guided paracentesis yielding 4 liters of peritoneal fluid. Electronically Signed   By: Marnee Spring M.D.   On: 05/30/2018 13:29   Dg Chest Port 1 View  Result Date: 05/31/2018 CLINICAL DATA:  Cough after taking medicine. EXAM: PORTABLE CHEST 1 VIEW COMPARISON:  05/29/2018. FINDINGS: Cardiomegaly with mild pulmonary vascular prominence and bilateral interstitial prominence suggesting mild CHF. Mild bibasilar subsegmental atelectasis. No pleural effusion or pneumothorax. IMPRESSION: 1. Cardiomegaly with mild pulmonary venous congestion bilateral interstitial prominence suggesting mild CHF. No pleural effusion. 2.  Low lung volumes with mild bibasilar atelectasis. Electronically Signed   By: Maisie Fus  Register   On: 05/31/2018 10:26   Dg Abd 2 Views  Result Date: 05/29/2018 CLINICAL DATA:  67 y/o M; bilateral leg and abdominal swelling. History of cirrhosis. EXAM: ABDOMEN - 2 VIEW COMPARISON:  04/28/2018 abdomen radiograph FINDINGS: The bowel gas pattern is normal. There is no evidence of free air. No radio-opaque calculi or other significant radiographic abnormality is seen. Multilevel degenerative changes of the spine. IMPRESSION: Normal bowel gas pattern. Electronically Signed   By: Mitzi Hansen M.D.   On: 05/29/2018 23:03   [place] Ascites (abdomen Limited)  Result Date: 06/22/2018 CLINICAL DATA:  Ascites, had 8 L withdrawn by paracentesis 2 days ago EXAM: LIMITED ABDOMEN ULTRASOUND FOR ASCITES TECHNIQUE: Limited ultrasound survey for ascites was performed in all four abdominal quadrants. COMPARISON:  05/30/2018 FINDINGS: Small amount of ascites is seen throughout the abdomen. This is variable in position with changes in patient position. Overall, the amount of fluid present is significantly less than was seen on 05/30/2018. Volume of fluid present is felt  insufficient for expected significant therapeutic benefit from paracentesis and paracentesis is not performed at this time. IMPRESSION: Scattered low volume ascites. Paracentesis not performed at this time. Electronically Signed   By: Ulyses Southward M.D.   On: 06/22/2018 15:05    Time Spent in minutes  35   Rakel Junio M.D on 06/25/2018 at 6:58  PM  Between 7am to 7pm - Pager - [phone]  After 7pm go to [email or web address] - password [facility]  [facility] -  Office  [phone]

## 2018-06-26 ENCOUNTER — Inpatient Hospital Stay (HOSPITAL_COMMUNITY): Payer: Medicare Other

## 2018-06-26 DIAGNOSIS — Z7189 Other specified counseling: Secondary | ICD-10-CM

## 2018-06-26 LAB — BASIC METABOLIC PANEL
ANION GAP: 4 — AB (ref 5–15)
BUN: 10 mg/dL (ref 8–23)
CALCIUM: 7.6 mg/dL — AB (ref 8.9–10.3)
CHLORIDE: 92 mmol/L — AB (ref 98–111)
CO2: 33 mmol/L — AB (ref 22–32)
CREATININE: 0.87 mg/dL (ref 0.61–1.24)
GFR calc Af Amer: >60 mL/min (ref >60)
GFR calc non Af Amer: >60 mL/min (ref >60)
GLUCOSE: 126 mg/dL — AB (ref 70–99)
Potassium: 3.6 mmol/L (ref 3.5–5.1)
Sodium: 129 mmol/L — ABNORMAL LOW (ref 135–145)

## 2018-06-26 LAB — PROTIME-INR
INR: 1.58
Prothrombin Time: 18.7 seconds — ABNORMAL HIGH (ref 11.4–15.2)

## 2018-06-26 LAB — VANCOMYCIN, TROUGH: Vancomycin Tr: 20 ug/mL (ref 15–20)

## 2018-06-26 MED ORDER — VANCOMYCIN HCL IN DEXTROSE 750-5 MG/150ML-% IV SOLN
750.0000 mg | Freq: Two times a day (BID) | INTRAVENOUS | Status: DC
  Filled 2018-06-26 (×3): qty 150

## 2018-06-26 MED ORDER — ALBUMIN HUMAN 25 % IV SOLN
INTRAVENOUS | Status: AC
  Administered 2018-06-26: 50 g via INTRAVENOUS
  Filled 2018-06-26: qty 200

## 2018-06-26 MED ORDER — DOXYCYCLINE HYCLATE 100 MG PO TABS
100.0000 mg | ORAL_TABLET | Freq: Two times a day (BID) | ORAL | Status: DC
  Administered 2018-06-26 – 2018-06-27 (×3): 100 mg via ORAL
  Filled 2018-06-26 (×3): qty 1

## 2018-06-26 MED ORDER — ALBUMIN HUMAN 25 % IV SOLN
50.0000 g | Freq: Once | INTRAVENOUS | Status: AC
  Administered 2018-06-26: 50 g via INTRAVENOUS

## 2018-06-26 NOTE — Progress Notes (Signed)
Progress Note  Patient Name: Derrick RossBarry K Fritsche Date of Encounter: 06/26/2018  Consulting Cardiologist: Dr. Jonelle SidleSamuel G. Delesia Martinek  Subjective   No chest pain or palpitations.  Abdomen feels tight.  No nausea or emesis.  Inpatient Medications    Scheduled Meds: . adenosine (ADENOCARD) IV  6 mg Intravenous Once  . bumetanide  1 mg Oral BID  . chlorhexidine   Topical BID  . Chlorhexidine Gluconate Cloth  6 each Topical Q0600  . cholecalciferol  1,000 Units Oral Daily  . docusate sodium  200 mg Oral BID  . ferrous sulfate  325 mg Oral Q breakfast  . folic acid  1 mg Oral Daily  . lactulose  20 g Oral TID  . multivitamin with minerals  1 tablet Oral Daily  . mupirocin ointment  1 application Nasal BID  . nadolol  20 mg Oral Daily  . pneumococcal 23 valent vaccine  0.5 mL Intramuscular Tomorrow-1000  . potassium chloride SA  20 mEq Oral BID  . sodium chloride flush  3 mL Intravenous Q12H  . spironolactone  100 mg Oral Daily  . thiamine  100 mg Oral Daily   Or  . thiamine  100 mg Intravenous Daily  . Vitamin D (Ergocalciferol)  50,000 Units Oral Q7 days   Continuous Infusions: . sodium chloride 250 mL (06/25/18 0806)  . vancomycin Stopped (06/25/18 2322)   PRN Meds: sodium chloride, polyvinyl alcohol, sodium chloride flush   Vital Signs    Vitals:   06/26/18 0404 06/26/18 0600 06/26/18 0700 06/26/18 0800  BP:      Pulse:  66 64 65  Resp:  17 15 14   Temp: 98.3 F (36.8 C)     TempSrc: Axillary     SpO2:  97% 98% 97%  Weight: 174 lb 13.2 oz (79.3 kg)     Height:        Intake/Output Summary (Last 24 hours) at 06/26/2018 0859 Last data filed at 06/26/2018 0404 Gross per 24 hour  Intake 3 ml  Output 450 ml  Net -447 ml   Filed Weights   06/24/18 0751 06/25/18 0400 06/26/18 0404  Weight: 173 lb 1 oz (78.5 kg) 174 lb 13.2 oz (79.3 kg) 174 lb 13.2 oz (79.3 kg)    Telemetry    Sinus rhythm.  Personally reviewed.  ECG    Tracing from 06/26/2018 shows a sinus  rhythm with LVH.  Personally reviewed.  Physical Exam   GEN:  Chronically ill-appearing male, no acute distress.   Neck: No JVD. Cardiac: RRR, no gallop.  Respiratory: Nonlabored. Clear to auscultation bilaterally. GI:  Protuberant, distended, bowel sounds present. MS:  Mild ankle and lower leg edema; No deformity. Neuro:  Nonfocal. Psych: Alert and oriented x 3. Normal affect.  Labs    Chemistry Recent Labs  Lab 06/23/18 0650 06/24/18 0830 06/25/18 0404 06/26/18 0442  NA 130* 126* 129* 129*  K 3.4* 3.5 3.6 3.6  CL 93* 92* 91* 92*  CO2 32 28 33* 33*  GLUCOSE 89 104* 123* 126*  BUN 11 11 10 10   CREATININE 0.89 0.83 0.88 0.87  CALCIUM 7.6* 7.5* 7.5* 7.6*  PROT 6.1* 6.2* 6.1*  --   ALBUMIN 2.2* 2.1* 2.1*  --   AST 32 36 45*  --   ALT 16 15 18   --   ALKPHOS 67 68 70  --   BILITOT 3.1* 3.0* 2.2*  --   GFRNONAA >60 >60 >60 >60  GFRAA >60 >60 >60 >60  ANIONGAP 5 6 5  4*     Hematology Recent Labs  Lab 06/23/18 0650 06/24/18 0830 06/25/18 0404  WBC 6.1 4.5 5.2  RBC 3.05* 3.15* 3.02*  HGB 10.5* 10.7* 10.3*  HCT 30.6* 31.3* 30.2*  MCV 100.3* 99.4 100.0  MCH 34.4* 34.0 34.1*  MCHC 34.3 34.2 34.1  RDW 15.6* 15.8* 16.1*  PLT 69* 79* 83*    Radiology    No results found.  Cardiac Studies   Echocardiogram 06/25/2018: Study Conclusions  - Left ventricle: The cavity size was normal. Wall thickness was at   the upper limits of normal. Systolic function was normal. The   estimated ejection fraction was in the range of 55% to 60%. Wall   motion was normal; there were no regional wall motion   abnormalities. Left ventricular diastolic function parameters   were normal. - Aortic valve: Mildly to moderately calcified annulus. Trileaflet;   mildly calcified leaflets. - Mitral valve: There was mild regurgitation. - Left atrium: The atrium was at the upper limits of normal in   size. - Right ventricle: The cavity size was mildly dilated. - Tricuspid valve: There  was trivial regurgitation. - Pulmonary arteries: Systolic pressure could not be accurately   estimated. - Pericardium, extracardiac: There was no pericardial effusion.  Patient Profile     67 y.o. male with a history of alcoholic cirrhosis and end-stage liver disease with recurring ascites, also recently documented SVT.  Assessment & Plan    1.  SVT, long RP tachycardia with possible reentrant mechanism and conversion following adenosine.  He has had no recurrences since consultation yesterday and is tolerating low-dose nadolol.  Follow-up ECG reviewed and stable.  LVEF 55 to 60% by echocardiogram.  2.  Alcoholic cirrhosis with end-stage liver disease and recurring ascites.  Per chart review patient is agreeable to Hospice follow-up and is to have a Pleurx abdominal catheter placed for drainage.  From a cardiac perspective would continue low-dose nadolol.  Plans for Hospice care noted.  No additional cardiac testing is planned at this time.  We will sign off.  Signed, Nona Dell, MD  06/26/2018, 8:59 AM

## 2018-06-26 NOTE — Care Management (Signed)
Discussed with Cassandra of Hopsice that patient will need have a pleurx drain placed.  Hospice will still admit patient into their services.  Will fax DC summary when available.

## 2018-06-26 NOTE — Progress Notes (Signed)
Palliative: [name] is sitting up in his bed talking on the telephone.  He greets me making and keeping eye contact.  He is alert and oriented, calm and cooperative, but appears chronically ill and frail.  No family at bedside at this time. We talk about the Pleurx drain, I share that interventional radiology has declined to place Pleurx due to infrequent tapping needs and risk for infection.  [name] shares that this is "another let down".  He is agreeable to continue with hospice referral.  I share that if he feels with fluid again quickly, he will more likely qualify for Pleurx. We talked again about CODE STATUS.  [name] states that he is agreeable to CPR and defibrillation but no intubation (life support).  He states that he will continue discussions with his family.  I asked [name] that if he comes in very sick, is unable to speak with us, make his needs known, would it be okay for his family to make a different choice.  He quickly states that it would be, his son Derrick Oneill makes "wise decisions". With hospitalist related to plan of care, IR declining to place Pleurx drain.  Conversation with case management related to disposition plan. Call to son, Derrick Oneill.  We talked about [name]'s acceptance into hospice, but no Pleurx drain at this time.  Derrick Oneill asks appropriate questions related to hospice care, continue to encourage him to direct these questions toward hospice.  We talked about my discussion with [name] related to CODE STATUS.  I share with Derrick Oneill that [name] agreed that it would be okay for Derrick Oneill to make a different choice related to CODE STATUS, if he has new information. 50 Minutes, extended time Derrick Carmelasha Dove, NP Palliative Medicine Team Team Phone # [phone]  Greater than 50% of this time was spent counseling and coordinating care related to the above assessment and plan

## 2018-06-26 NOTE — Clinical Social Work Note (Signed)
Reviewed pt's record. Plan is for pt to dc home with hospice care once plurex cath placed and pt stable. RN CM following for this disposition.  Will sign off.

## 2018-06-26 NOTE — Progress Notes (Signed)
Paracentesis complete no signs of distress.  

## 2018-06-26 NOTE — Progress Notes (Signed)
Dressing placed to right abd paracentesis site due to serous leakage.  Patient ambulating on room, alert and oriented.

## 2018-06-26 NOTE — Care Management Note (Signed)
Case Management Note  Patient Details  Name: Derrick Oneill MRN: [id number] Date of Birth: February 12, 1951  If discussed at Long Length of Stay Meetings, dates discussed:  06/26/2018  Additional Comments:  Lenah Messenger, Chrystine OilerSharley Diane, RN 06/26/2018, 12:28 PM

## 2018-06-26 NOTE — Progress Notes (Signed)
Pharmacy Antibiotic Note  Derrick Oneill is a 67 y.o. male admitted on 06/20/2018 with cellulitis.  Pharmacy has been consulted for vancomycin dosing.  Plan:  Decrease to Vancomycin 750 mg IV every 12 hours. Goal trough 10-15 mcg/mL Monitor labs, c/s, and levels as indicated   Height: 5\' 8"  (172.7 cm) Weight: 174 lb 13.2 oz (79.3 kg) IBW/kg (Calculated) : 68.4  Temp (24hrs), Avg:98.3 F (36.8 C), Min:98 F (36.7 C), Max:98.6 F (37 C)  Recent Labs  Lab 06/20/18 1506 06/20/18 1536 06/21/18 0633 06/22/18 0455 06/23/18 0650 06/24/18 0830 06/25/18 0404 06/26/18 0442 06/26/18 0924  WBC 7.7  --  5.6  --  6.1 4.5 5.2  --   --   CREATININE 1.02  --  1.04 1.00 0.89 0.83 0.88 0.87  --   LATICACIDVEN  --  2.27*  --   --   --   --   --   --   --   VANCOTROUGH  --   --   --   --   --   --   --   --  20    Estimated Creatinine Clearance: 81.9 mL/min (by C-G formula based on SCr of 0.87 mg/dL).    No Known Allergies  Anti-infectives (From admission, onward)   Start     Dose/Rate Route Frequency Ordered Stop   06/26/18 1400  vancomycin (VANCOCIN) IVPB 750 mg/150 ml premix     750 mg 150 mL/hr over 60 Minutes Intravenous Every 12 hours 06/26/18 1144     06/23/18 2200  vancomycin (VANCOCIN) IVPB 1000 mg/200 mL premix  Status:  Discontinued     1,000 mg 200 mL/hr over 60 Minutes Intravenous Every 12 hours 06/23/18 1027 06/26/18 1144   06/23/18 1000  vancomycin (VANCOCIN) 1,500 mg in sodium chloride 0.9 % 500 mL IVPB     1,500 mg 250 mL/hr over 120 Minutes Intravenous  Once 06/23/18 0958 06/23/18 1241   06/21/18 1400  ceFAZolin (ANCEF) IVPB 1 g/50 mL premix  Status:  Discontinued     1 g 100 mL/hr over 30 Minutes Intravenous Every 8 hours 06/21/18 1312 06/23/18 0843   06/21/18 0500  vancomycin (VANCOCIN) IVPB 1000 mg/200 mL premix  Status:  Discontinued     1,000 mg 200 mL/hr over 60 Minutes Intravenous Every 12 hours 06/20/18 2027 06/21/18 1313   06/20/18 2200   piperacillin-tazobactam (ZOSYN) IVPB 3.375 g  Status:  Discontinued     3.375 g 12.5 mL/hr over 240 Minutes Intravenous Every 8 hours 06/20/18 2027 06/21/18 1312   06/20/18 1600  vancomycin (VANCOCIN) 1,500 mg in sodium chloride 0.9 % 500 mL IVPB     1,500 mg 250 mL/hr over 120 Minutes Intravenous  Once 06/20/18 1530 06/20/18 1824   06/20/18 1515  piperacillin-tazobactam (ZOSYN) IVPB 3.375 g     3.375 g 100 mL/hr over 30 Minutes Intravenous  Once 06/20/18 1508 06/20/18 1627   06/20/18 1515  vancomycin (VANCOCIN) IVPB 1000 mg/200 mL premix  Status:  Discontinued     1,000 mg 200 mL/hr over 60 Minutes Intravenous  Once 06/20/18 1508 06/20/18 1530      Antimicrobials this admission:  Vancomycin 7/20>>  zosyn 7/17>> 7/18    Microbiology results:  7/17 BCx: ngtd 7/17 Peritoneal Fluid from Peritoneal Cavity: ngtd   Thank you for allowing pharmacy to be a part of this patient's care.  Tad MooreSteven C Adelia Baptista 06/26/2018 11:44 AM

## 2018-06-26 NOTE — Progress Notes (Signed)
PROGRESS NOTE                                                                                                                                                                                                             Patient Demographics:    Derrick Oneill, is a 67 y.o. male, DOB - 01-24-1951, ZOX:[id number]  Admit date - 06/20/2018   Admitting Physician Haydee Monica, MD  Outpatient Primary MD for the patient is [facility]  LOS - 5  Outpatient Specialists: None  Chief Complaint  Patient presents with  . Ascites       Brief Narrative   67 year old male with alcoholic liver cirrhosis, hypertension who presented to the ED with increasing abdominal distention needing paracentesis and had 8 L fluid removed in the ED.  He was also found to have bilateral leg cellulitis.  During hospital stay he developed SVT not resolved with vagal maneuvers followed by admission and required amiodarone infusion and monitored in ICU.   Subjective:   Continues to have increased abdominal distention. HR stable.   Assessment  & Plan :    Principal Problem: Supraventricular tachycardia Placed on amiodarone drip after adenosine did not control his HR. Now stable, off amiodarone drip. Started on nadolol per cardiology recommendations.  2D echo with EF of 50-60% and no wall motion abnormality. HR stable.   Active Problems: Bilateral lower leg cellulitis On empiric IV vancomycin with improvement.  Small skin abscess on the right thigh which was discussed with surgeon who recommended to continue IV antibiotic and topical care with warm compress.  Remains afebrile and improving. narrow to po doxycycline today to complete 10 day course of abx ( stop date after 7/26).  Decompensated liver cirrhosis with ascites Frequent hospital visits for paracentesis.  Had 8 L ascites fluid removed this admission and now reaccumulating.  No signs  of SBP. Palliative care consult appreciated.  Overall prognosis is guarded.  Patient wishes to go home with hospice services ([facility] who [facility] followed the patient).  He wished to have a Pleurx drain placed.  IR consult placed but given that patient has not had so frequent paracentesis (last one was on 6/25 prior to this admission), IR was hesitant placing a catheter in and wanted to make sure patient had more frequent fill ups. I  have ordered another large volume paracentesis with albumin today. Monitor overnight, he was having  significant leak around the paracentesis site earlier.  I stable can be discharged home with home hospice tomorrow.  if patient returning to ED more frequently for paracentesis ( eg every 1-2 weeks or less, IR should be consulted again for pleureX placement)  Hypokalemia Replenished  Chronic hyponatremia Secondary to hypervolemia with liver disease.  Essential hypertension Stable.  Chronic alcohol abuse No signs of withdrawal.  Monitor on CIWA.     Code Status : Full code, GOC discussed. patient has poor insignt of his terminal liver disease. Son informs that he's going to talk to patient about code status and prefers he should be made DNR.   Family Communication  : None at bedside. D/w son at bedside  Disposition Plan  : transfer to medical floor. Home with hospice tomorrow if stable after LVP and HR continues to be stable.  Barriers For Discharge : Active symptoms  Consults  : Cardiology, palliative care  Procedures  : Paracentesis (7/17 and 7/23), 2D echo  DVT Prophylaxis  : SCDs  Lab Results  Component Value Date   PLT 83 (L) 06/25/2018    Antibiotics  :    Anti-infectives (From admission, onward)   Start     Dose/Rate Route Frequency Ordered Stop   06/26/18 1400  vancomycin (VANCOCIN) IVPB 750 mg/150 ml premix     750 mg 150 mL/hr over 60 Minutes Intravenous Every 12 hours 06/26/18 1144     06/23/18 2200   vancomycin (VANCOCIN) IVPB 1000 mg/200 mL premix  Status:  Discontinued     1,000 mg 200 mL/hr over 60 Minutes Intravenous Every 12 hours 06/23/18 1027 06/26/18 1144   06/23/18 1000  vancomycin (VANCOCIN) 1,500 mg in sodium chloride 0.9 % 500 mL IVPB     1,500 mg 250 mL/hr over 120 Minutes Intravenous  Once 06/23/18 0958 06/23/18 1241   06/21/18 1400  ceFAZolin (ANCEF) IVPB 1 g/50 mL premix  Status:  Discontinued     1 g 100 mL/hr over 30 Minutes Intravenous Every 8 hours 06/21/18 1312 06/23/18 0843   06/21/18 0500  vancomycin (VANCOCIN) IVPB 1000 mg/200 mL premix  Status:  Discontinued     1,000 mg 200 mL/hr over 60 Minutes Intravenous Every 12 hours 06/20/18 2027 06/21/18 1313   06/20/18 2200  piperacillin-tazobactam (ZOSYN) IVPB 3.375 g  Status:  Discontinued     3.375 g 12.5 mL/hr over 240 Minutes Intravenous Every 8 hours 06/20/18 2027 06/21/18 1312   06/20/18 1600  vancomycin (VANCOCIN) 1,500 mg in sodium chloride 0.9 % 500 mL IVPB     1,500 mg 250 mL/hr over 120 Minutes Intravenous  Once 06/20/18 1530 06/20/18 1824   06/20/18 1515  piperacillin-tazobactam (ZOSYN) IVPB 3.375 g     3.375 g 100 mL/hr over 30 Minutes Intravenous  Once 06/20/18 1508 06/20/18 1627   06/20/18 1515  vancomycin (VANCOCIN) IVPB 1000 mg/200 mL premix  Status:  Discontinued     1,000 mg 200 mL/hr over 60 Minutes Intravenous  Once 06/20/18 1508 06/20/18 1530        Objective:   Vitals:   06/26/18 0700 06/26/18 0800 06/26/18 1145 06/26/18 1224  BP:   101/75 96/70  Pulse: 64 65 64 64  Resp: 15 14 18 18   Temp:  98.3 F (36.8 C)    TempSrc:  Axillary    SpO2: 98% 97% 98% 98%  Weight:      Height:  Wt Readings from Last 3 Encounters:  06/26/18 79.3 kg (174 lb 13.2 oz)  06/01/18 81.1 kg (178 lb 11.2 oz)  03/03/16 90.7 kg (199 lb 14.4 oz)     Intake/Output Summary (Last 24 hours) at 06/26/2018 1239 Last data filed at 06/26/2018 0910 Gross per 24 hour  Intake -  Output 900 ml  Net -900 ml     Physical exam Fatigued, NAD HEENT: moist mucosa, supple neck Chest: clear b/l  CVS: NS1&S2, no murmurs GI: distended with tense ascites, non tender Musculoskeletal: cellulitis fo b/l almost resolved except for a swelling over rt medial thigh without erythema, warmth or tenderness. CNS: alert and oriented, no tremors      Data Review:    CBC Recent Labs  Lab 06/20/18 1506 06/21/18 0633 06/23/18 0650 06/24/18 0830 06/25/18 0404  WBC 7.7 5.6 6.1 4.5 5.2  HGB 9.3* 9.5* 10.5* 10.7* 10.3*  HCT 27.5* 28.3* 30.6* 31.3* 30.2*  PLT 61* 44* 69* 79* 83*  MCV 101.5* 101.4* 100.3* 99.4 100.0  MCH 34.3* 34.1* 34.4* 34.0 34.1*  MCHC 33.8 33.6 34.3 34.2 34.1  RDW 16.3* 16.2* 15.6* 15.8* 16.1*  LYMPHSABS 0.7  --  1.0 0.9 0.7  MONOABS 0.6  --  1.0 1.1* 1.5*  EOSABS 0.0  --  0.2 0.1 0.1  BASOSABS 0.0  --  0.0 0.0 0.1    Chemistries  Recent Labs  Lab 06/20/18 1506 06/21/18 0633 06/22/18 0455 06/23/18 0650 06/24/18 0754 06/24/18 0830 06/25/18 0404 06/26/18 0442  NA 124* 130* 130* 130*  --  126* 129* 129*  K 3.8 3.3* 3.0* 3.4*  --  3.5 3.6 3.6  CL 91* 96* 93* 93*  --  92* 91* 92*  CO2 26 29 31  32  --  28 33* 33*  GLUCOSE 143* 152* 94 89  --  104* 123* 126*  BUN 11 14 12 11   --  11 10 10   CREATININE 1.02 1.04 1.00 0.89  --  0.83 0.88 0.87  CALCIUM 7.7* 7.5* 7.4* 7.6*  --  7.5* 7.5* 7.6*  MG  --   --   --   --  1.6*  --  1.9  --   AST 45* 34  --  32  --  36 45*  --   ALT 27 20  --  16  --  15 18  --   ALKPHOS 88 66  --  67  --  68 70  --   BILITOT 5.1* 4.0*  --  3.1*  --  3.0* 2.2*  --    ------------------------------------------------------------------------------------------------------------------ No results for input(s): CHOL, HDL, LDLCALC, TRIG, CHOLHDL, LDLDIRECT in the last 72 hours.  Lab Results  Component Value Date   HGBA1C 5.4 02/29/2016   ------------------------------------------------------------------------------------------------------------------ No  results for input(s): TSH, T4TOTAL, T3FREE, THYROIDAB in the last 72 hours.  Invalid input(s): FREET3 ------------------------------------------------------------------------------------------------------------------ No results for input(s): VITAMINB12, FOLATE, FERRITIN, TIBC, IRON, RETICCTPCT in the last 72 hours.  Coagulation profile Recent Labs  Lab 06/26/18 0924  INR 1.58    No results for input(s): DDIMER in the last 72 hours.  Cardiac Enzymes No results for input(s): CKMB, TROPONINI, MYOGLOBIN in the last 168 hours.  Invalid input(s): CK ------------------------------------------------------------------------------------------------------------------ No results found for: BNP  Inpatient Medications  Scheduled Meds: . adenosine (ADENOCARD) IV  6 mg Intravenous Once  . bumetanide  1 mg Oral BID  . chlorhexidine   Topical BID  . Chlorhexidine Gluconate Cloth  6 each Topical Q0600  . cholecalciferol  1,000 Units Oral Daily  . docusate sodium  200 mg Oral BID  . ferrous sulfate  325 mg Oral Q breakfast  . folic acid  1 mg Oral Daily  . lactulose  20 g Oral TID  . multivitamin with minerals  1 tablet Oral Daily  . mupirocin ointment  1 application Nasal BID  . nadolol  20 mg Oral Daily  . potassium chloride SA  20 mEq Oral BID  . sodium chloride flush  3 mL Intravenous Q12H  . spironolactone  100 mg Oral Daily  . thiamine  100 mg Oral Daily   Or  . thiamine  100 mg Intravenous Daily  . Vitamin D (Ergocalciferol)  50,000 Units Oral Q7 days   Continuous Infusions: . sodium chloride 250 mL (06/25/18 0806)  . vancomycin     PRN Meds:.sodium chloride, polyvinyl alcohol, sodium chloride flush  Micro Results Recent Results (from the past 240 hour(s))  Blood Culture (routine x 2)     Status: None   Collection Time: 06/20/18  3:06 PM  Result Value Ref Range Status   Specimen Description SITE NOT SPECIFIED  Final   Special Requests   Final    BOTTLES DRAWN AEROBIC AND  ANAEROBIC Blood Culture adequate volume   Culture   Final    NO GROWTH 5 DAYS Performed at [facility], [address] [place], [place] [address]    Report Status 06/25/2018 FINAL  Final  Blood Culture (routine x 2)     Status: None   Collection Time: 06/20/18  3:56 PM  Result Value Ref Range Status   Specimen Description LEFT ANTECUBITAL  Final   Special Requests   Final    BOTTLES DRAWN AEROBIC AND ANAEROBIC Blood Culture adequate volume   Culture   Final    NO GROWTH 5 DAYS Performed at [facility], [address] [place], [place] [address]    Report Status 06/25/2018 FINAL  Final  Culture, body fluid-bottle     Status: None   Collection Time: 06/20/18  4:52 PM  Result Value Ref Range Status   Specimen Description PERITONEAL  Final   Special Requests BOTTLES DRAWN AEROBIC AND ANAEROBIC 10CC  Final   Culture   Final    NO GROWTH 5 DAYS Performed at [facility], [address] [place], [place] [address]    Report Status 06/25/2018 FINAL  Final  Gram stain     Status: None   Collection Time: 06/20/18  4:55 PM  Result Value Ref Range Status   Specimen Description PERITONEAL  Final   Special Requests NONE  Final   Gram Stain   Final    CYTOSPIN SMEAR NO ORGANISMS SEEN WBC PRESENT, PREDOMINANTLY MONONUCLEAR Performed at [facility], [address], [place], [place] [address]    Report Status 06/21/2018 FINAL  Final  MRSA PCR Screening     Status: Abnormal   Collection Time: 06/25/18  6:00 AM  Result Value Ref Range Status   MRSA by PCR POSITIVE (A) NEGATIVE Final    Comment:        The GeneXpert MRSA Assay (FDA approved for NASAL specimens only), is one component of a comprehensive MRSA colonization surveillance program. It is not intended to diagnose MRSA infection nor to guide or monitor treatment for MRSA infections. RESULT CALLED TO, READ BACK BY AND VERIFIED WITH: SMART M. AT [id number] ON [id number] BY THOMPSON S. Performed at [facility], [address]  [place], [place] [address]  Radiology Reports Dg Chest 2 View  Result Date: 06/20/2018 CLINICAL DATA:  Cough, shortness of breath EXAM: CHEST - 2 VIEW COMPARISON:  05/31/2018 FINDINGS: Trace left pleural effusion. Pulmonary vascular congestion. No frank interstitial edema, improved. No pneumothorax. The heart is top-normal in size. IMPRESSION: Trace left pleural effusion. Pulmonary vascular congestion. No frank interstitial edema, improved. Electronically Signed   By: Charline Bills M.D.   On: 06/20/2018 17:42   Dg Chest 2 View  Result Date: 05/29/2018 CLINICAL DATA:  67 y/o M; bilateral leg pain and swelling. Shortness of breath. EXAM: CHEST - 2 VIEW COMPARISON:  04/17/2017 chest radiograph. FINDINGS: Stable cardiac silhouette within normal limits given projection and technique. Low lung volumes accentuate pulmonary markings. Pulmonary vascular congestion and probable mild interstitial edema. Bibasilar platelike atelectasis. No pleural effusion or pneumothorax. No acute osseous abnormality identified should IMPRESSION: Low lung volumes. Minor bibasilar atelectasis and probable mild interstitial edema. Electronically Signed   By: Mitzi Hansen M.D.   On: 05/29/2018 23:00   US Abdomen Limited  Result Date: 06/01/2018 INDICATION: Ascites. EXAM: ULTRASOUND GUIDED PARACENTESIS MEDICATIONS: None. ANESTHESIA/SEDATION: None. COMPLICATIONS: None. PROCEDURE: Ultrasound was performed and revealed only a tiny amount of ascites. Paracentesis not performed at this time due to small amount of intra-abdominal fluid. Repeat attempt can be obtained as needed. FINDINGS: A paracentesis not performed due to low volume of fluid. IMPRESSION: Paracentesis not performed due to low volume of fluid. Follow-up exam can be obtained as needed. Electronically Signed   By: Maisie Fus  Register   On: 06/01/2018 14:04   US Paracentesis  Result Date: 06/26/2018 INDICATION: Ascites. EXAM: ULTRASOUND GUIDED  PARACENTESIS  MEDICATIONS: None. COMPLICATIONS: None immediate. PROCEDURE: Informed written consent was obtained from the patient after a discussion of the risks, benefits and alternatives to treatment. A timeout was performed prior to the initiation of the procedure. Initial ultrasound scanning demonstrates a large amount of ascites within the right lower abdominal quadrant. The right lower abdomen was prepped and draped in the usual sterile fashion. 1% lidocaine was used for local anesthesia. Following this, a 6 French catheter was introduced. An ultrasound image was saved for documentation purposes. The paracentesis was performed. The catheter was removed and a dressing was applied. The patient tolerated the procedure well without immediate post procedural complication. FINDINGS: A total of approximately 5.4 L of clear fluid was removed. Samples were sent to the laboratory as requested by the clinical team. IMPRESSION: Successful ultrasound-guided paracentesis yielding 5.4 liters of peritoneal fluid. Electronically Signed   By: Maisie Fus  Register   On: 06/26/2018 12:35   US Paracentesis  Result Date: 05/30/2018 INDICATION: Symptomatic ascites EXAM: ULTRASOUND GUIDED THERAPEUTIC PARACENTESIS MEDICATIONS: None. COMPLICATIONS: None immediate. PROCEDURE: Informed written consent was obtained from the patient after a discussion of the risks, benefits and alternatives to treatment. A timeout was performed prior to the initiation of the procedure. Initial ultrasound scanning demonstrates a large amount of ascites within the right lower abdominal quadrant. The right lower abdomen was prepped and draped in the usual sterile fashion. 1% lidocaine was used for local anesthesia. After tiny skin nick a 19 gauge, 7-cm, Yueh catheter was introduced. An ultrasound image was saved for documentation purposes. The paracentesis was performed. the catheter was removed and a dressing was applied. The patient tolerated the procedure well without  immediate post procedural complication. FINDINGS: A total of approximately 4 of clear straw-colored fluid was removed. Samples were sent to the laboratory as requested by the clinical team. IMPRESSION: Successful ultrasound-guided paracentesis  yielding 4 liters of peritoneal fluid. Electronically Signed   By: Marnee SpringJonathon  Watts M.D.   On: 05/30/2018 13:29   Dg Chest Port 1 View  Result Date: 05/31/2018 CLINICAL DATA:  Cough after taking medicine. EXAM: PORTABLE CHEST 1 VIEW COMPARISON:  05/29/2018. FINDINGS: Cardiomegaly with mild pulmonary vascular prominence and bilateral interstitial prominence suggesting mild CHF. Mild bibasilar subsegmental atelectasis. No pleural effusion or pneumothorax. IMPRESSION: 1. Cardiomegaly with mild pulmonary venous congestion bilateral interstitial prominence suggesting mild CHF. No pleural effusion. 2.  Low lung volumes with mild bibasilar atelectasis. Electronically Signed   By: Maisie Fushomas  Register   On: 05/31/2018 10:26   Dg Abd 2 Views  Result Date: 05/29/2018 CLINICAL DATA:  67 y/o M; bilateral leg and abdominal swelling. History of cirrhosis. EXAM: ABDOMEN - 2 VIEW COMPARISON:  04/28/2018 abdomen radiograph FINDINGS: The bowel gas pattern is normal. There is no evidence of free air. No radio-opaque calculi or other significant radiographic abnormality is seen. Multilevel degenerative changes of the spine. IMPRESSION: Normal bowel gas pattern. Electronically Signed   By: Mitzi HansenLance  Furusawa-Stratton M.D.   On: 05/29/2018 23:03   [place] Ascites (abdomen Limited)  Result Date: 06/22/2018 CLINICAL DATA:  Ascites, had 8 L withdrawn by paracentesis 2 days ago EXAM: LIMITED ABDOMEN ULTRASOUND FOR ASCITES TECHNIQUE: Limited ultrasound survey for ascites was performed in all four abdominal quadrants. COMPARISON:  05/30/2018 FINDINGS: Small amount of ascites is seen throughout the abdomen. This is variable in position with changes in patient position. Overall, the amount of fluid present  is significantly less than was seen on 05/30/2018. Volume of fluid present is felt insufficient for expected significant therapeutic benefit from paracentesis and paracentesis is not performed at this time. IMPRESSION: Scattered low volume ascites. Paracentesis not performed at this time. Electronically Signed   By: Ulyses SouthwardMark  Boles M.D.   On: 06/22/2018 15:05    Time Spent in minutes  25   Keijuan Schellhase M.D on 06/26/2018 at 12:39 PM  Between 7am to 7pm - Pager - [phone]  After 7pm go to [email or web address] - password [email or web address]  [facility] -  Office  [phone]

## 2018-06-27 DIAGNOSIS — L03116 Cellulitis of left lower limb: Secondary | ICD-10-CM

## 2018-06-27 DIAGNOSIS — K7031 Alcoholic cirrhosis of liver with ascites: Secondary | ICD-10-CM

## 2018-06-27 DIAGNOSIS — F1010 Alcohol abuse, uncomplicated: Secondary | ICD-10-CM

## 2018-06-27 DIAGNOSIS — I10 Essential (primary) hypertension: Secondary | ICD-10-CM

## 2018-06-27 DIAGNOSIS — L03115 Cellulitis of right lower limb: Principal | ICD-10-CM

## 2018-06-27 DIAGNOSIS — E871 Hypo-osmolality and hyponatremia: Secondary | ICD-10-CM

## 2018-06-27 MED ORDER — TORSEMIDE 20 MG PO TABS: 20.0000 mg | ORAL_TABLET | Freq: Every day | ORAL | 1 refills | Status: AC

## 2018-06-27 MED ORDER — SPIRONOLACTONE 100 MG PO TABS: 100.0000 mg | ORAL_TABLET | Freq: Every day | ORAL | 3 refills | Status: AC

## 2018-06-27 MED ORDER — NADOLOL 20 MG PO TABS: 20.0000 mg | ORAL_TABLET | Freq: Every day | ORAL | 1 refills | Status: AC

## 2018-06-27 MED ORDER — DOXYCYCLINE HYCLATE 100 MG PO TABS: 100.0000 mg | ORAL_TABLET | Freq: Two times a day (BID) | ORAL | 0 refills | Status: AC

## 2018-06-27 NOTE — Progress Notes (Addendum)
Patient's abdominal pad and gauze was saturated with serous fluid again. Approximately 175 mls of clear serous fluid was absorbed in the original dressing.  Removed old dressing and replaced with fresh gauze, ABD pad and tape. Will continue to monitor.   Patient had a paracentesis from this sight yesterday. According to the patient, he says some times his paracentesis sights will leak up to 7 or 8 days before they are completely sealed.

## 2018-06-27 NOTE — Care Management Note (Signed)
Case Management Note  Patient Details  Name: Derrick RossBarry K Heymann MRN: [id number] Date of Birth: 1951/02/10  Subjective/Objective:                    Action/Plan: Patient to DC today. Hospice has decided to put patient on "surveillance" until he receives a pleurx drain. Patient will DC home with resumption of home health RN with [facility].  Patient updated and agreeable to plan.   Expected Discharge Date:  06/21/18               Expected Discharge Plan:  Home w Home Health Services  In-House Referral:  Clinical Social Work, OrthoptistChaplain, Hospice / Palliative Care  Discharge planning Services  CM Consult  Post Acute Care Choice:  Home Health, Resumption of Svcs/PTA Provider Choice offered to:  Patient  DME Arranged:    DME Agency:     HH Arranged:  RN HH Agency:  [facility]  Status of Service:  Completed, signed off  If discussed at [employer] Length of Stay Meetings, dates discussed:    Additional Comments:  Autie Vasudevan, Chrystine OilerSharley Diane, RN 06/27/2018, 10:45 AM

## 2018-06-27 NOTE — Progress Notes (Signed)
Patient discharged home with personal belongings. IV removed and site intact. Patient discharged with prescriptions.  

## 2018-06-27 NOTE — Discharge Summary (Signed)
Physician Discharge Summary  Derrick Oneill BMW:[id number] DOB: [birth year] DOA: 06/20/2018  [facility]P: [facility]  Admit date: 06/20/2018 Discharge date: 06/27/2018  Time spent: 35 minutes  Recommendations for Outpatient Follow-up:  1. Repeat basic metabolic panel to follow electrolytes and renal function 2. Repeat LFTs to reassess liver function test. 3. Follow-up blood pressure and volume status; further adjust antihypertensive medications and diuretics as needed.   Discharge Diagnoses:  Principal Problem:   Fever Active Problems:   Hepatic cirrhosis (HCC)   Alcohol abuse   Essential hypertension   Bilateral cellulitis of lower leg   Goals of care, counseling/discussion   Palliative care by specialist   Encounter for hospice care discussion   DNR (do not resuscitate) discussion   Hyponatremia   Discharge Condition: Stable and improved.  Discharge home with instruction to follow-up with PCP in 10 days.  Patient is now DNR and looking to reassess ascites reaccumulation for further decision on placing Pleurx catheter and engaging into hospice care.  Diet recommendation: Low-sodium diet.  Filed Weights   06/24/18 0751 06/25/18 0400 06/26/18 0404  Weight: 78.5 kg (173 lb 1 oz) 79.3 kg (174 lb 13.2 oz) 79.3 kg (174 lb 13.2 oz)    History of present illness:  As per H&P written by Dr. Onalee Huaavid on 06/20/2018. 67 y.o. male with medical history significant of alcoholic cirrhosis of liver, hypertension comes in with increased abdominal swelling and the need for paracentesis.  Patient reports that the [facility] is not efficient and setting up his paracentesis as needed.  He denies any fevers.  He denies any nausea vomiting diarrhea.  He continues to drink alcohol daily.  Patient came in short of breath was found to have a temperature of 100.3.  Dr. [name] in the emergency department has stopped 8 L of fluid from his abdomen today and is referred him for admission for fever likely  due to pneumonia with new infiltrate on chest x-ray   Hospital Course:  1-supraventricular tachycardia -Patient required amiodarone drip and also 1 dose of adenosine to control his heart rate. -Patient is now on now the low 20 mg by mouth daily to assist controlling his heart rate and also as a prevention for esophageal varices given history of decompensated liver cirrhosis. -Blood pressure tolerating well current medication.  2-bilateral lower extremity cellulitis -He was empirically treated with IV vancomycin with improvement of his symptoms. -At discharge will send home on doxycycline 100 mg twice a day for another 3 days to complete treatment (last dose of antibiotics cannot be 06/29/2018). -No fever.  3-Decompensated liver cirrhosis with ascites -Patient is still actively drinking. -Had 8 L of ascites removed on 7/17 and then 5.4 L on 7/23 -Patient discharged on Demadex 20 mg daily and also spironolactone 100 mg daily. -Instructed to follow low-sodium diet (less than 2 g on daily basis). -He is motivated to pursued just hospice care, before that we will require a Pleurx catheter drain.  Will follow further needs and reaccumulation rate to determine needs of Pleurx. -continue lactulose   4-alcohol abuse: -Cessation counseling has been provideMonitor with CIWA protocol and no withdrawal appreciated. -continue thiamine and folic acid  7-hypokalemia -repleted -repeat BMET at follow up visit to reassess electrolytes   8-chronic hyponatremia -in the setting of alcohol cirrhosis and ascites -improved with paracentesis and use of diuretics -advise to quit drinking and to follow low sodium diet   9-esentila HTN -stable and well controlled with current regimen  -advise to  take his time when changing positions  Procedures:  See below for x-ray reports  Large volume paracentesis (7/17 and 7/23)  Consultations:  Palliative care  Discharge Exam: Vitals:   06/27/18 0606  06/27/18 0644  BP: (!) 89/62 102/70  Pulse: 68   Resp: 18   Temp: 98.3 F (36.8 C)   SpO2: 94%     General: Afebrile, no chest pain, no nausea, no vomiting.  Reports no abdominal pain and is anxious to go home. Cardiovascular: No rubs, no gallops, no JVD; S1 and S2 appreciated on exam. Respiratory: Good air movement bilaterally; no crackles, no wheezing. Abdomen: Positive ascites appreciated (but significantly improved based on records description and patient reports); no tenderness, positive bowel sounds; no guarding. Extremities: 2+ edema bilaterally appreciated on exam; mild redness and excoriations with skin breakdown seen on both legs.  Discharge Instructions   Discharge Instructions    Diet - low sodium heart healthy   Complete by:  As directed    Discharge instructions   Complete by:  As directed    Stop alcohol consumption Take medications as prescribed Follow low-sodium diet (less than 2000 mg of sodium per day) Maintain adequate hydration Arrange follow-up with PCP in 10 days.     Allergies as of 06/27/2018   No Known Allergies     Medication List    STOP taking these medications   bumetanide 1 MG tablet Commonly known as:  BUMEX     TAKE these medications   cholecalciferol 1000 units tablet Commonly known as:  VITAMIN D Take 1,000 Units by mouth daily.   clobetasol cream 0.05 % Commonly known as:  TEMOVATE Apply 1 application topically 2 (two) times daily as needed (Apply a thin layer as needed for psoriasis).   docusate sodium 100 MG capsule Commonly known as:  COLACE Take 200 mg by mouth 2 (two) times daily.   doxycycline 100 MG tablet Commonly known as:  VIBRA-TABS Take 1 tablet (100 mg total) by mouth every 12 (twelve) hours for 3 days.   ferrous sulfate 325 (65 FE) MG tablet Take 325 mg by mouth daily with breakfast.   folic acid 1 MG tablet Commonly known as:  FOLVITE Take 1 mg by mouth daily.   lactulose 10 GM/15ML solution Commonly  known as:  CHRONULAC Take 20 g by mouth 3 (three) times daily.   multivitamin with minerals Tabs tablet Take 1 tablet by mouth daily.   nadolol 20 MG tablet Commonly known as:  CORGARD Take 1 tablet (20 mg total) by mouth daily. Start taking on:  06/28/2018   potassium chloride SA 20 MEQ tablet Commonly known as:  K-DUR,KLOR-CON Take 20 mEq by mouth 2 (two) times daily.   spironolactone 100 MG tablet Commonly known as:  ALDACTONE Take 1 tablet (100 mg total) by mouth daily. Start taking on:  06/28/2018 What changed:  medication strength   thiamine 100 MG tablet Take 100 mg by mouth daily.   torsemide 20 MG tablet Commonly known as:  DEMADEX Take 1 tablet (20 mg total) by mouth daily.   Vitamin D (Ergocalciferol) 50000 units Caps capsule Commonly known as:  DRISDOL Take 50,000 Units by mouth every 7 (seven) days.      No Known Allergies Follow-up Information    [facility] Follow up.   Why:  RN Contact information: [address] [place] [address] [phone]       [facility] Follow up.   Why:  on support list -RN will follow up with you monthly to assess appropriateness for their services.  Contact information: [address] [place] [address] [phone]        [facility]. Schedule an appointment as soon as possible for a visit in 10 day(s).   Contact information: [address] [place] [address] [phone]        Jonelle Sidle, MD .   Specialty:  Cardiology Contact information: [address] [place] [address] [phone]           The results of significant diagnostics from this hospitalization (including imaging, microbiology, ancillary and laboratory) are listed below for reference.    Significant Diagnostic Studies: Dg Chest 2 View  Result Date: 06/20/2018 CLINICAL DATA:  Cough, shortness of breath EXAM: CHEST - 2 VIEW COMPARISON:  05/31/2018 FINDINGS: Trace  left pleural effusion. Pulmonary vascular congestion. No frank interstitial edema, improved. No pneumothorax. The heart is top-normal in size. IMPRESSION: Trace left pleural effusion. Pulmonary vascular congestion. No frank interstitial edema, improved. Electronically Signed   By: Charline Bills M.D.   On: 06/20/2018 17:42   Dg Chest 2 View  Result Date: 05/29/2018 CLINICAL DATA:  67 y/o M; bilateral leg pain and swelling. Shortness of breath. EXAM: CHEST - 2 VIEW COMPARISON:  04/17/2017 chest radiograph. FINDINGS: Stable cardiac silhouette within normal limits given projection and technique. Low lung volumes accentuate pulmonary markings. Pulmonary vascular congestion and probable mild interstitial edema. Bibasilar platelike atelectasis. No pleural effusion or pneumothorax. No acute osseous abnormality identified should IMPRESSION: Low lung volumes. Minor bibasilar atelectasis and probable mild interstitial edema. Electronically Signed   By: Mitzi Hansen M.D.   On: 05/29/2018 23:00   US Abdomen Limited  Result Date: 06/01/2018 INDICATION: Ascites. EXAM: ULTRASOUND GUIDED PARACENTESIS MEDICATIONS: None. ANESTHESIA/SEDATION: None. COMPLICATIONS: None. PROCEDURE: Ultrasound was performed and revealed only a tiny amount of ascites. Paracentesis not performed at this time due to small amount of intra-abdominal fluid. Repeat attempt can be obtained as needed. FINDINGS: A paracentesis not performed due to low volume of fluid. IMPRESSION: Paracentesis not performed due to low volume of fluid. Follow-up exam can be obtained as needed. Electronically Signed   By: Maisie Fus  Register   On: 06/01/2018 14:04   US Paracentesis  Result Date: 06/26/2018 INDICATION: Ascites. EXAM: ULTRASOUND GUIDED  PARACENTESIS MEDICATIONS: None. COMPLICATIONS: None immediate. PROCEDURE: Informed written consent was obtained from the patient after a discussion of the risks, benefits and alternatives to treatment. A timeout  was performed prior to the initiation of the procedure. Initial ultrasound scanning demonstrates a large amount of ascites within the right lower abdominal quadrant. The right lower abdomen was prepped and draped in the usual sterile fashion. 1% lidocaine was used for local anesthesia. Following this, a 6 French catheter was introduced. An ultrasound image was saved for documentation purposes. The paracentesis was performed. The catheter was removed and a dressing was applied. The patient tolerated the procedure well without immediate post procedural complication. FINDINGS: A total of approximately 5.4 L of clear fluid was removed. Samples were sent to the laboratory as requested by the clinical team. IMPRESSION: Successful ultrasound-guided paracentesis yielding 5.4 liters of peritoneal fluid. Electronically Signed   By: Maisie Fus  Register   On: 06/26/2018 12:35   US Paracentesis  Result Date: 05/30/2018 INDICATION: Symptomatic ascites EXAM: ULTRASOUND GUIDED THERAPEUTIC PARACENTESIS MEDICATIONS: None. COMPLICATIONS: None immediate. PROCEDURE: Informed written consent was obtained from the patient after a discussion of the risks, benefits and  alternatives to treatment. A timeout was performed prior to the initiation of the procedure. Initial ultrasound scanning demonstrates a large amount of ascites within the right lower abdominal quadrant. The right lower abdomen was prepped and draped in the usual sterile fashion. 1% lidocaine was used for local anesthesia. After tiny skin nick a 19 gauge, 7-cm, Yueh catheter was introduced. An ultrasound image was saved for documentation purposes. The paracentesis was performed. the catheter was removed and a dressing was applied. The patient tolerated the procedure well without immediate post procedural complication. FINDINGS: A total of approximately 4 of clear straw-colored fluid was removed. Samples were sent to the laboratory as requested by the clinical team.  IMPRESSION: Successful ultrasound-guided paracentesis yielding 4 liters of peritoneal fluid. Electronically Signed   By: Marnee Spring M.D.   On: 05/30/2018 13:29   Dg Chest Port 1 View  Result Date: 05/31/2018 CLINICAL DATA:  Cough after taking medicine. EXAM: PORTABLE CHEST 1 VIEW COMPARISON:  05/29/2018. FINDINGS: Cardiomegaly with mild pulmonary vascular prominence and bilateral interstitial prominence suggesting mild CHF. Mild bibasilar subsegmental atelectasis. No pleural effusion or pneumothorax. IMPRESSION: 1. Cardiomegaly with mild pulmonary venous congestion bilateral interstitial prominence suggesting mild CHF. No pleural effusion. 2.  Low lung volumes with mild bibasilar atelectasis. Electronically Signed   By: Maisie Fus  Register   On: 05/31/2018 10:26   Dg Abd 2 Views  Result Date: 05/29/2018 CLINICAL DATA:  67 y/o M; bilateral leg and abdominal swelling. History of cirrhosis. EXAM: ABDOMEN - 2 VIEW COMPARISON:  04/28/2018 abdomen radiograph FINDINGS: The bowel gas pattern is normal. There is no evidence of free air. No radio-opaque calculi or other significant radiographic abnormality is seen. Multilevel degenerative changes of the spine. IMPRESSION: Normal bowel gas pattern. Electronically Signed   By: Mitzi Hansen M.D.   On: 05/29/2018 23:03   [place] Ascites (abdomen Limited)  Result Date: 06/22/2018 CLINICAL DATA:  Ascites, had 8 L withdrawn by paracentesis 2 days ago EXAM: LIMITED ABDOMEN ULTRASOUND FOR ASCITES TECHNIQUE: Limited ultrasound survey for ascites was performed in all four abdominal quadrants. COMPARISON:  05/30/2018 FINDINGS: Small amount of ascites is seen throughout the abdomen. This is variable in position with changes in patient position. Overall, the amount of fluid present is significantly less than was seen on 05/30/2018. Volume of fluid present is felt insufficient for expected significant therapeutic benefit from paracentesis and paracentesis is not  performed at this time. IMPRESSION: Scattered low volume ascites. Paracentesis not performed at this time. Electronically Signed   By: Ulyses Southward M.D.   On: 06/22/2018 15:05    Microbiology: Recent Results (from the past 240 hour(s))  Blood Culture (routine x 2)     Status: None   Collection Time: 06/20/18  3:06 PM  Result Value Ref Range Status   Specimen Description SITE NOT SPECIFIED  Final   Special Requests   Final    BOTTLES DRAWN AEROBIC AND ANAEROBIC Blood Culture adequate volume   Culture   Final    NO GROWTH 5 DAYS Performed at [facility], [address] [place], [place] [address]    Report Status 06/25/2018 FINAL  Final  Blood Culture (routine x 2)     Status: None   Collection Time: 06/20/18  3:56 PM  Result Value Ref Range Status   Specimen Description LEFT ANTECUBITAL  Final   Special Requests   Final    BOTTLES DRAWN AEROBIC AND ANAEROBIC Blood Culture adequate volume   Culture   Final  NO GROWTH 5 DAYS Performed at [facility], [address], [place], [place] [address]    Report Status 06/25/2018 FINAL  Final  Culture, body fluid-bottle     Status: None   Collection Time: 06/20/18  4:52 PM  Result Value Ref Range Status   Specimen Description PERITONEAL  Final   Special Requests BOTTLES DRAWN AEROBIC AND ANAEROBIC 10CC  Final   Culture   Final    NO GROWTH 5 DAYS Performed at [facility], [address], [place], [place] [address]    Report Status 06/25/2018 FINAL  Final  Gram stain     Status: None   Collection Time: 06/20/18  4:55 PM  Result Value Ref Range Status   Specimen Description PERITONEAL  Final   Special Requests NONE  Final   Gram Stain   Final    CYTOSPIN SMEAR NO ORGANISMS SEEN WBC PRESENT, PREDOMINANTLY MONONUCLEAR Performed at [facility], [address] [place], [place] [address]    Report Status 06/21/2018 FINAL  Final  MRSA PCR Screening     Status: Abnormal   Collection Time: 06/25/18  6:00 AM  Result Value Ref Range Status    MRSA by PCR POSITIVE (A) NEGATIVE Final    Comment:        The GeneXpert MRSA Assay (FDA approved for NASAL specimens only), is one component of a comprehensive MRSA colonization surveillance program. It is not intended to diagnose MRSA infection nor to guide or monitor treatment for MRSA infections. RESULT CALLED TO, READ BACK BY AND VERIFIED WITH: SMART M. AT [id number] ON [id number] BY THOMPSON S. Performed at [facility], [address], [place], [place] [address]      Labs: Basic Metabolic Panel: Recent Labs  Lab 06/22/18 0455 06/23/18 0650 06/24/18 0754 06/24/18 0830 06/25/18 0404 06/26/18 0442  NA 130* 130*  --  126* 129* 129*  K 3.0* 3.4*  --  3.5 3.6 3.6  CL 93* 93*  --  92* 91* 92*  CO2 31 32  --  28 33* 33*  GLUCOSE 94 89  --  104* 123* 126*  BUN 12 11  --  11 10 10   CREATININE 1.00 0.89  --  0.83 0.88 0.87  CALCIUM 7.4* 7.6*  --  7.5* 7.5* 7.6*  MG  --   --  1.6*  --  1.9  --   PHOS  --   --   --  2.5  --   --    Liver Function Tests: Recent Labs  Lab 06/21/18 0633 06/23/18 0650 06/24/18 0830 06/25/18 0404  AST 34 32 36 45*  ALT 20 16 15 18   ALKPHOS 66 67 68 70  BILITOT 4.0* 3.1* 3.0* 2.2*  PROT 6.0* 6.1* 6.2* 6.1*  ALBUMIN 2.3* 2.2* 2.1* 2.1*   CBC: Recent Labs  Lab 06/21/18 0633 06/23/18 0650 06/24/18 0830 06/25/18 0404  WBC 5.6 6.1 4.5 5.2  NEUTROABS  --  3.9 2.3 2.8  HGB 9.5* 10.5* 10.7* 10.3*  HCT 28.3* 30.6* 31.3* 30.2*  MCV 101.4* 100.3* 99.4 100.0  PLT 44* 69* 79* 83*    Signed:  Vassie Loll MD.  [facility] 06/27/2018, 4:33 PM

## 2018-07-15 ENCOUNTER — Other Ambulatory Visit: Payer: Self-pay

## 2018-07-15 ENCOUNTER — Encounter (HOSPITAL_COMMUNITY): Payer: Self-pay

## 2018-07-15 ENCOUNTER — Inpatient Hospital Stay (HOSPITAL_COMMUNITY)
Admission: EM | Admit: 2018-07-15 | Discharge: 2018-07-20 | DRG: 432 | Disposition: A | Discharge status: Home / Self Care | Payer: Medicare Other | Attending: Internal Medicine | Admitting: Internal Medicine

## 2018-07-15 ENCOUNTER — Emergency Department (HOSPITAL_COMMUNITY): Payer: Medicare Other

## 2018-07-15 DIAGNOSIS — E8770 Fluid overload, unspecified: Secondary | ICD-10-CM | POA: Diagnosis present

## 2018-07-15 DIAGNOSIS — Z66 Do not resuscitate: Secondary | ICD-10-CM | POA: Diagnosis present

## 2018-07-15 DIAGNOSIS — I10 Essential (primary) hypertension: Secondary | ICD-10-CM | POA: Diagnosis not present

## 2018-07-15 DIAGNOSIS — K7460 Unspecified cirrhosis of liver: Secondary | ICD-10-CM | POA: Diagnosis present

## 2018-07-15 DIAGNOSIS — E871 Hypo-osmolality and hyponatremia: Secondary | ICD-10-CM | POA: Diagnosis not present

## 2018-07-15 DIAGNOSIS — R188 Other ascites: Secondary | ICD-10-CM

## 2018-07-15 DIAGNOSIS — E7220 Disorder of urea cycle metabolism, unspecified: Secondary | ICD-10-CM

## 2018-07-15 DIAGNOSIS — K7031 Alcoholic cirrhosis of liver with ascites: Secondary | ICD-10-CM | POA: Diagnosis not present

## 2018-07-15 DIAGNOSIS — Z8379 Family history of other diseases of the digestive system: Secondary | ICD-10-CM

## 2018-07-15 DIAGNOSIS — L409 Psoriasis, unspecified: Secondary | ICD-10-CM | POA: Diagnosis present

## 2018-07-15 DIAGNOSIS — R627 Adult failure to thrive: Secondary | ICD-10-CM | POA: Diagnosis present

## 2018-07-15 DIAGNOSIS — D649 Anemia, unspecified: Secondary | ICD-10-CM

## 2018-07-15 DIAGNOSIS — R0609 Other forms of dyspnea: Secondary | ICD-10-CM | POA: Diagnosis not present

## 2018-07-15 DIAGNOSIS — R0600 Dyspnea, unspecified: Secondary | ICD-10-CM

## 2018-07-15 DIAGNOSIS — Z515 Encounter for palliative care: Secondary | ICD-10-CM | POA: Diagnosis present

## 2018-07-15 DIAGNOSIS — F1010 Alcohol abuse, uncomplicated: Secondary | ICD-10-CM | POA: Diagnosis present

## 2018-07-15 DIAGNOSIS — R531 Weakness: Secondary | ICD-10-CM

## 2018-07-15 DIAGNOSIS — E43 Unspecified severe protein-calorie malnutrition: Secondary | ICD-10-CM | POA: Diagnosis present

## 2018-07-15 DIAGNOSIS — F17290 Nicotine dependence, other tobacco product, uncomplicated: Secondary | ICD-10-CM | POA: Diagnosis present

## 2018-07-15 DIAGNOSIS — R0602 Shortness of breath: Secondary | ICD-10-CM

## 2018-07-15 DIAGNOSIS — N289 Disorder of kidney and ureter, unspecified: Secondary | ICD-10-CM

## 2018-07-15 HISTORY — DX: Psoriasis, unspecified: L40.9

## 2018-07-15 LAB — PROTIME-INR
INR: 1.52
PROTHROMBIN TIME: 18.1 s — AB (ref 11.4–15.2)

## 2018-07-15 LAB — COMPREHENSIVE METABOLIC PANEL
ALK PHOS: 115 U/L (ref 38–126)
ALT: 34 U/L (ref 0–44)
AST: 58 U/L — AB (ref 15–41)
Albumin: 2.4 g/dL — ABNORMAL LOW (ref 3.5–5.0)
Anion gap: 7 (ref 5–15)
BILIRUBIN TOTAL: 4.7 mg/dL — AB (ref 0.3–1.2)
BUN: 12 mg/dL (ref 8–23)
CHLORIDE: 95 mmol/L — AB (ref 98–111)
CO2: 24 mmol/L (ref 22–32)
CREATININE: 0.86 mg/dL (ref 0.61–1.24)
Calcium: 8.2 mg/dL — ABNORMAL LOW (ref 8.9–10.3)
Glucose, Bld: 146 mg/dL — ABNORMAL HIGH (ref 70–99)
Potassium: 4.4 mmol/L (ref 3.5–5.1)
Sodium: 126 mmol/L — ABNORMAL LOW (ref 135–145)
TOTAL PROTEIN: 7.5 g/dL (ref 6.5–8.1)

## 2018-07-15 LAB — AMMONIA: AMMONIA: 61 umol/L — AB (ref 9–35)

## 2018-07-15 LAB — CBC
HCT: 24.5 % — ABNORMAL LOW (ref 39.0–52.0)
Hemoglobin: 8.5 g/dL — ABNORMAL LOW (ref 13.0–17.0)
MCH: 34.8 pg — ABNORMAL HIGH (ref 26.0–34.0)
MCHC: 34.7 g/dL (ref 30.0–36.0)
MCV: 100.4 fL — AB (ref 78.0–100.0)
Platelets: 121 10*3/uL — ABNORMAL LOW (ref 150–400)
RBC: 2.44 MIL/uL — AB (ref 4.22–5.81)
RDW: 18.1 % — ABNORMAL HIGH (ref 11.5–15.5)
WBC: 7.1 10*3/uL (ref 4.0–10.5)

## 2018-07-15 LAB — ETHANOL

## 2018-07-15 MED ORDER — SODIUM CHLORIDE 0.9 % IV SOLN: 500.0000 mg | INTRAVENOUS | Status: DC

## 2018-07-15 MED ORDER — FOLIC ACID 1 MG PO TABS
1.0000 mg | ORAL_TABLET | Freq: Every day | ORAL | Status: DC
  Administered 2018-07-16 – 2018-07-20 (×5): 1 mg via ORAL
  Filled 2018-07-15 (×5): qty 1

## 2018-07-15 MED ORDER — DOCUSATE SODIUM 100 MG PO CAPS
200.0000 mg | ORAL_CAPSULE | Freq: Two times a day (BID) | ORAL | Status: DC
  Administered 2018-07-15 – 2018-07-19 (×8): 200 mg via ORAL
  Filled 2018-07-15 (×10): qty 2

## 2018-07-15 MED ORDER — NADOLOL 20 MG PO TABS
20.0000 mg | ORAL_TABLET | Freq: Every day | ORAL | Status: DC
  Administered 2018-07-15 – 2018-07-20 (×6): 20 mg via ORAL
  Filled 2018-07-15 (×9): qty 1

## 2018-07-15 MED ORDER — TORSEMIDE 20 MG PO TABS
20.0000 mg | ORAL_TABLET | Freq: Every day | ORAL | Status: DC
  Administered 2018-07-15 – 2018-07-20 (×6): 20 mg via ORAL
  Filled 2018-07-15 (×6): qty 1

## 2018-07-15 MED ORDER — SODIUM CHLORIDE 0.9 % IV SOLN: INTRAVENOUS | Status: DC

## 2018-07-15 MED ORDER — ONDANSETRON HCL 4 MG PO TABS: 4.0000 mg | ORAL_TABLET | Freq: Four times a day (QID) | ORAL | Status: DC | PRN

## 2018-07-15 MED ORDER — LACTULOSE 10 GM/15ML PO SOLN
20.0000 g | Freq: Once | ORAL | Status: AC
  Administered 2018-07-15: 20 g via ORAL
  Filled 2018-07-15: qty 30

## 2018-07-15 MED ORDER — LACTULOSE 10 GM/15ML PO SOLN
20.0000 g | Freq: Three times a day (TID) | ORAL | Status: DC
  Administered 2018-07-15 – 2018-07-20 (×14): 20 g via ORAL
  Filled 2018-07-15 (×14): qty 30

## 2018-07-15 MED ORDER — SODIUM CHLORIDE 0.9 % IV BOLUS
500.0000 mL | Freq: Once | INTRAVENOUS | Status: DC
  Administered 2018-07-15: 500 mL via INTRAVENOUS

## 2018-07-15 MED ORDER — SPIRONOLACTONE 25 MG PO TABS
100.0000 mg | ORAL_TABLET | Freq: Every day | ORAL | Status: DC
  Administered 2018-07-15 – 2018-07-20 (×6): 100 mg via ORAL
  Filled 2018-07-15 (×6): qty 4

## 2018-07-15 MED ORDER — CLOBETASOL PROPIONATE 0.05 % EX CREA
1.0000 "application " | TOPICAL_CREAM | Freq: Two times a day (BID) | CUTANEOUS | Status: DC | PRN
  Filled 2018-07-15: qty 15

## 2018-07-15 MED ORDER — ACETAMINOPHEN 650 MG RE SUPP: 650.0000 mg | Freq: Four times a day (QID) | RECTAL | Status: DC | PRN

## 2018-07-15 MED ORDER — POTASSIUM CHLORIDE CRYS ER 20 MEQ PO TBCR
20.0000 meq | EXTENDED_RELEASE_TABLET | Freq: Two times a day (BID) | ORAL | Status: DC
  Administered 2018-07-15 – 2018-07-20 (×10): 20 meq via ORAL
  Filled 2018-07-15 (×3): qty 1
  Filled 2018-07-15: qty 2
  Filled 2018-07-15 (×6): qty 1

## 2018-07-15 MED ORDER — VITAMIN B-1 100 MG PO TABS
100.0000 mg | ORAL_TABLET | Freq: Every day | ORAL | Status: DC
  Administered 2018-07-15 – 2018-07-20 (×6): 100 mg via ORAL
  Filled 2018-07-15 (×8): qty 1

## 2018-07-15 MED ORDER — NADOLOL 40 MG PO TABS
ORAL_TABLET | ORAL | Status: AC
  Filled 2018-07-15: qty 1

## 2018-07-15 MED ORDER — ACETAMINOPHEN 325 MG PO TABS
650.0000 mg | ORAL_TABLET | Freq: Four times a day (QID) | ORAL | Status: DC | PRN
  Administered 2018-07-17 – 2018-07-18 (×2): 650 mg via ORAL
  Filled 2018-07-15 (×2): qty 2

## 2018-07-15 MED ORDER — FERROUS SULFATE 325 (65 FE) MG PO TABS
325.0000 mg | ORAL_TABLET | Freq: Every day | ORAL | Status: DC
  Administered 2018-07-16 – 2018-07-20 (×5): 325 mg via ORAL
  Filled 2018-07-15 (×5): qty 1

## 2018-07-15 MED ORDER — ONDANSETRON HCL 4 MG/2ML IJ SOLN: 4.0000 mg | Freq: Four times a day (QID) | INTRAMUSCULAR | Status: DC | PRN

## 2018-07-15 NOTE — H&P (Addendum)
History and Physical  Derrick Oneill ZOX:[id number] DOB: 11-Aug-1951 DOA: 07/15/2018  Referring physician: Dr Denton Lank, ED physician PCP: [facility]  Outpatient Specialists:   Patient Coming From: Home  Chief Complaint: Shortness of breath, weakness  HPI: Derrick Oneill is a 67 y.o. male with a history of alcoholic cirrhosis with ascites, hypertension, renal insufficiency has significant amount of ascites and has had paracentesis performed 3-4 times over the past month.  Today, he presents with shortness of breath and weakness there is been increasing over the past few days.  Weaknesses and shortness of breath increased with exertion and improved with rest.  During his last hospitalization in July, he did have a palliative care consult with the discussion of hospice.  Patient has not made a decision at that time but now states that he is interested in going to hospice.  Also notable, the patient had an injury to his shoulder and lower back after jumping from his lawnmower last week when he lost control going down a steep embankment.  Emergency Department Course: Tense ascites noted on exam.  Sodium 126, ammonia 61, hemoglobin 8.5, INR 1.52  Review of Systems:   Pt denies any fevers, chills, nausea, vomiting, diarrhea, constipation, abdominal pain, orthopnea, cough, wheezing, palpitations, headache, vision changes, lightheadedness, dizziness, melena, rectal bleeding.  Review of systems are otherwise negative  Past Medical History:  Diagnosis Date  . Cirrhosis (HCC)   . Hypertension   . Psoriasis   . Renal insufficiency 05/29/2018   Past Surgical History:  Procedure Laterality Date  . HERNIA REPAIR    . PARACENTESIS    . UMBILICAL HERNIA REPAIR     Social History:  reports that he has been smoking cigars. He has quit using smokeless tobacco.  His smokeless tobacco use included snuff and chew. He reports that he drinks alcohol. He reports that he does not use  drugs. Patient lives at home  No Known Allergies  Family History  Problem Relation Age of Onset  . Cirrhosis Father        deceased age 85, etoh related      Prior to Admission medications   Medication Sig Start Date End Date Taking? Authorizing Provider  cholecalciferol (VITAMIN D) 1000 units tablet Take 1,000 Units by mouth daily.   Yes [provider]  clobetasol cream (TEMOVATE) 0.05 % Apply 1 application topically 2 (two) times daily as needed (Apply a thin layer as needed for psoriasis).   Yes [provider]  docusate sodium (COLACE) 100 MG capsule Take 200 mg by mouth 2 (two) times daily.    Yes [provider]  ferrous sulfate 325 (65 FE) MG tablet Take 325 mg by mouth daily with breakfast.   Yes [provider]  folic acid (FOLVITE) 1 MG tablet Take 1 mg by mouth daily.   Yes [provider]  lactulose (CHRONULAC) 10 GM/15ML solution Take 20 g by mouth 3 (three) times daily.    Yes [provider]  Multiple Vitamin (MULTIVITAMIN WITH MINERALS) TABS tablet Take 1 tablet by mouth daily. 06/02/18  Yes Johnson, Clanford L, MD  nadolol (CORGARD) 20 MG tablet Take 1 tablet (20 mg total) by mouth daily. 06/28/18  Yes Vassie Loll, MD  potassium chloride SA (K-DUR,KLOR-CON) 20 MEQ tablet Take 20 mEq by mouth 2 (two) times daily.   Yes [provider]  spironolactone (ALDACTONE) 100 MG tablet Take 1 tablet (100 mg total) by mouth daily. 06/28/18  Yes Vassie Loll,  MD  thiamine 100 MG tablet Take 100 mg by mouth daily.   Yes [provider]  torsemide (DEMADEX) 20 MG tablet Take 1 tablet (20 mg total) by mouth daily. 06/27/18  Yes Vassie LollMadera, Carlos, MD  Vitamin D, Ergocalciferol, (DRISDOL) 50000 units CAPS capsule Take 50,000 Units by mouth every 7 (seven) days.   Yes [provider]    Physical Exam: BP 123/70   Pulse 85   Temp 98.7 F (37.1 C) (Oral)   Resp (!) 22   Ht 5\' 7"  (1.702 m)   Wt 93 kg   SpO2  95%   BMI 32.11 kg/m   . General: Elderly Caucasian male. Awake and alert and oriented x3. No acute cardiopulmonary distress.  Marland Kitchen. HEENT: Normocephalic atraumatic.  Right and left ears normal in appearance.  Pupils equal, round, reactive to light. Extraocular muscles are intact. Sclerae anicteric and noninjected.  Moist mucosal membranes. No mucosal lesions.  . Neck: Neck supple without lymphadenopathy. No carotid bruits. No masses palpated.  . Cardiovascular: Regular rate with normal S1-S2 sounds. No murmurs, rubs, gallops auscultated. No JVD.  Marland Kitchen. Respiratory: Patient dyspneic on exam, speaks in short sentences with mild increased respiratory rate and gasping. No accessory muscle use. . Abdomen: Significant distention to the patient's abdomen secondary to ascites.  Hypertympanitic.  Mild tenderness throughout.  Liver margin extended significantly past the costal margin. . Skin: Lower extremities edematous with erythema, which patient states is at baseline.  Dry, warm to touch. 2+ dorsalis pedis and radial pulses. . Musculoskeletal: No calf or leg pain. All major joints not erythematous nontender.  No upper or lower joint deformation.  Good ROM.  No contractures  . Psychiatric: Intact judgment and insight. Pleasant and cooperative. . Neurologic: No focal neurological deficits. Strength is 5/5 and symmetric in upper and lower extremities.  Cranial nerves II through XII are grossly intact.           Labs on Admission: I have personally reviewed following labs and imaging studies  CBC: Recent Labs  Lab 07/15/18 1252  WBC 7.1  HGB 8.5*  HCT 24.5*  MCV 100.4*  PLT 121*   Basic Metabolic Panel: Recent Labs  Lab 07/15/18 1252  NA 126*  K 4.4  CL 95*  CO2 24  GLUCOSE 146*  BUN 12  CREATININE 0.86  CALCIUM 8.2*   GFR: Estimated Creatinine Clearance: 93.1 mL/min (by C-G formula based on SCr of 0.86 mg/dL). Liver Function Tests: Recent Labs  Lab 07/15/18 1252  AST 58*  ALT 34   ALKPHOS 115  BILITOT 4.7*  PROT 7.5  ALBUMIN 2.4*   No results for input(s): LIPASE, AMYLASE in the last 168 hours. Recent Labs  Lab 07/15/18 1243  AMMONIA 61*   Coagulation Profile: Recent Labs  Lab 07/15/18 1252  INR 1.52   Cardiac Enzymes: No results for input(s): CKTOTAL, CKMB, CKMBINDEX, TROPONINI in the last 168 hours. BNP (last 3 results) No results for input(s): PROBNP in the last 8760 hours. HbA1C: No results for input(s): HGBA1C in the last 72 hours. CBG: No results for input(s): GLUCAP in the last 168 hours. Lipid Profile: No results for input(s): CHOL, HDL, LDLCALC, TRIG, CHOLHDL, LDLDIRECT in the last 72 hours. Thyroid Function Tests: No results for input(s): TSH, T4TOTAL, FREET4, T3FREE, THYROIDAB in the last 72 hours. Anemia Panel: No results for input(s): VITAMINB12, FOLATE, FERRITIN, TIBC, IRON, RETICCTPCT in the last 72 hours. Urine analysis:    Component Value Date/Time   COLORURINE YELLOW 06/22/2018  0131   APPEARANCEUR CLEAR 06/22/2018 0131   LABSPEC 1.005 06/22/2018 0131   PHURINE 5.0 06/22/2018 0131   GLUCOSEU NEGATIVE 06/22/2018 0131   HGBUR MODERATE (A) 06/22/2018 0131   BILIRUBINUR NEGATIVE 06/22/2018 0131   KETONESUR NEGATIVE 06/22/2018 0131   PROTEINUR NEGATIVE 06/22/2018 0131   NITRITE NEGATIVE 06/22/2018 0131   LEUKOCYTESUR NEGATIVE 06/22/2018 0131   Sepsis Labs: @LABRCNTIP (procalcitonin:4,lacticidven:4) )No results found for this or any previous visit (from the past 240 hour(s)).   Radiological Exams on Admission: Dg Thoracic Spine 2 View  Result Date: 07/15/2018 CLINICAL DATA:  Status post fall with back pain. EXAM: THORACIC SPINE 2 VIEWS COMPARISON:  Chest x-ray June 20, 2018, CT thoracic spine December 13, 2006 FINDINGS: There is no evidence of thoracic spine fracture. There is almost 70% compression deformity of a lower thoracic vertebral body unchanged and chronic compared to prior exams. Alignment is normal. Degenerative joint  changes with osteophytosis and narrowed joint space are identified throughout spine. IMPRESSION: No acute fracture or dislocation identified. Chronic compression deformity of a lower thoracic vertebral body. Electronically Signed   By: Sherian Rein M.D.   On: 07/15/2018 14:20   Dg Lumbar Spine Complete  Result Date: 07/15/2018 CLINICAL DATA:  Status post fall with back pain. EXAM: LUMBAR SPINE - COMPLETE 4+ VIEW COMPARISON:  MR lumbar spine September 09, 2013 FINDINGS: There is no evidence of lumbar spine fracture. Mild chronic compression deformity of L4 and L5 are noted. Alignment is normal. There are degenerative joint changes of the spine with narrowed joint space and osteophyte formation. IMPRESSION: No acute fracture or dislocation. Electronically Signed   By: Sherian Rein M.D.   On: 07/15/2018 14:22   Dg Shoulder Right  Result Date: 07/15/2018 CLINICAL DATA:  Right shoulder injury last week. EXAM: RIGHT SHOULDER - 2+ VIEW COMPARISON:  None. FINDINGS: No acute fracture or dislocation identified. Prominent osteophyte formation involving the distal clavicle and undersurface of the acromion. Degenerative disease also noted involving the glenohumeral joint. No bony lesions. Soft tissues appear unremarkable. IMPRESSION: No acute fracture identified. Prominent osteophyte formation of the distal clavicle and acromion. Degenerative disease of the glenohumeral joint. Electronically Signed   By: Irish Lack M.D.   On: 07/15/2018 14:19    EKG: Independently reviewed.  Sinus rhythm.  Wandering baseline.  Flat T waves in lateral leads.  Inverted T waves in lead III and aVF.  No acute ST changes.  Assessment/Plan: Principal Problem:   Dyspnea Active Problems:   Hepatic cirrhosis (HCC)   Alcohol abuse   Essential hypertension   Renal insufficiency   Alcoholic cirrhosis of liver with ascites (HCC)   Hyponatremia   FTT (failure to thrive) in adult    This patient was discussed with the ED  physician, including pertinent vitals, physical exam findings, labs, and imaging.  We also discussed care given by the ED provider.  1. Dyspnea a. Secondary to ascites has diaphragm unable to fully distended to allow deep breath. b. Paracentesis ordered for tomorrow 2. Alcohol cirrhosis of the liver with ascites a. Paracentesis tomorrow b. Orders placed c. N.p.o. after midnight d. I discussed with the patient regarding treatment goals and plans.  Patient would like to go home on hospice e. Will consult palliative care f. Case management consulted for home health needs and to help establish hospice g. As the patient wants hospice, I discussed whether we should continue getting blood draws -at this time the patient declines to have any further testing done. 3. Hepatic  cirrhosis-end-stage a. Ammonia slightly elevated.  Patient does not appear confused on my exam.  Will continue with lactulose and increase if appears to have some confusion later on 4. Hypertension a. Continue home medication 5. Hyponatremia-chronic a. Slightly lower.  Will likely improve after paracentesis, however per patient's wishes, we will not recheck 6. Failure to thrive in adult  DVT prophylaxis: SCDs Consultants: Radiology for paracentesis Code Status: DNR Family Communication: None Disposition Plan: Anticipate patient to return home on hospice   Levie Heritage, DO [facility] Pager [phone]  If 7PM-7AM, please contact night-coverage [email or web address] Password [email or web address]

## 2018-07-15 NOTE — ED Triage Notes (Signed)
Pt reports that he was mowing grass on Sunday and almost ran through bob wire fence, jumped off lawn mower and hurt back, right shoulder and buttocks is purple. Also reports that his stomach is swollen due to ascites. Bilateral redness to lower legs
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
## 2018-07-16 ENCOUNTER — Observation Stay (HOSPITAL_COMMUNITY): Payer: Medicare Other

## 2018-07-16 DIAGNOSIS — Z66 Do not resuscitate: Secondary | ICD-10-CM | POA: Diagnosis present

## 2018-07-16 DIAGNOSIS — R0600 Dyspnea, unspecified: Secondary | ICD-10-CM | POA: Diagnosis not present

## 2018-07-16 DIAGNOSIS — Z7189 Other specified counseling: Secondary | ICD-10-CM | POA: Diagnosis not present

## 2018-07-16 DIAGNOSIS — E43 Unspecified severe protein-calorie malnutrition: Secondary | ICD-10-CM | POA: Diagnosis present

## 2018-07-16 DIAGNOSIS — E871 Hypo-osmolality and hyponatremia: Secondary | ICD-10-CM | POA: Diagnosis present

## 2018-07-16 DIAGNOSIS — L409 Psoriasis, unspecified: Secondary | ICD-10-CM | POA: Diagnosis present

## 2018-07-16 DIAGNOSIS — K7031 Alcoholic cirrhosis of liver with ascites: Secondary | ICD-10-CM | POA: Diagnosis present

## 2018-07-16 DIAGNOSIS — R627 Adult failure to thrive: Secondary | ICD-10-CM | POA: Diagnosis present

## 2018-07-16 DIAGNOSIS — Z515 Encounter for palliative care: Secondary | ICD-10-CM

## 2018-07-16 DIAGNOSIS — R0609 Other forms of dyspnea: Secondary | ICD-10-CM

## 2018-07-16 DIAGNOSIS — E8770 Fluid overload, unspecified: Secondary | ICD-10-CM | POA: Diagnosis present

## 2018-07-16 DIAGNOSIS — F1010 Alcohol abuse, uncomplicated: Secondary | ICD-10-CM | POA: Diagnosis present

## 2018-07-16 DIAGNOSIS — I10 Essential (primary) hypertension: Secondary | ICD-10-CM | POA: Diagnosis present

## 2018-07-16 DIAGNOSIS — F17290 Nicotine dependence, other tobacco product, uncomplicated: Secondary | ICD-10-CM | POA: Diagnosis present

## 2018-07-16 DIAGNOSIS — R531 Weakness: Secondary | ICD-10-CM | POA: Diagnosis not present

## 2018-07-16 DIAGNOSIS — Z8379 Family history of other diseases of the digestive system: Secondary | ICD-10-CM | POA: Diagnosis not present

## 2018-07-16 MED ORDER — CLOBETASOL PROPIONATE 0.05 % EX OINT
TOPICAL_OINTMENT | Freq: Two times a day (BID) | CUTANEOUS | Status: DC | PRN
  Filled 2018-07-16: qty 15
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
## 2018-07-16 NOTE — Progress Notes (Signed)
PROGRESS NOTE    Derrick Oneill  ZOX:[id number] DOB: 1951-05-08 DOA: 07/15/2018 PCP: [facility]     Brief Narrative:  67 year old man admitted from home on 8/11 due to shortness of breath and increased weakness.  He has a history of alcoholic cirrhosis with a situs was undergoing frequent paracentesis, per report he has had 3-4 paracentesis over the past 6 weeks.  During his last hospitalization in July he had accepted hospice care, however that transition has not been made as hospice was unable to accept him unless a Pleurx catheter was placed due to frequency of paracentesis.   Assessment & Plan:   Principal Problem:   Dyspnea Active Problems:   Hepatic cirrhosis (HCC)   Alcohol abuse   Essential hypertension   Renal insufficiency   Alcoholic cirrhosis of liver with ascites (HCC)   Hyponatremia   FTT (failure to thrive) in adult   Dyspnea -Suspect secondary to significant abdominal ascites, improved status post paracentesis.  Alcoholic cirrhosis of the liver with ascites -Had paracentesis performed on 8/11 with removal of 4.3 L of ascitic fluid. -Patient is willing to proceed with hospice care.  Hospice is requesting abdominal Pleurx catheter placed.  Have requested IR consultation and of also discussed with Dr. Lovell Sheehan to see which is the quickest avenue of getting this accomplished.  Next  Chronic hyponatremia -This is hypervolemic hyponatremia due to cirrhosis and volume overload. -Patient is not interested in having frequent blood draws to have this checked.  Severe protein caloric malnutrition/failure to thrive -Patient will be proceeding towards hospice care.  Alcoholic cirrhosis of the liver end stage -Continue lactulose, hopefully for Pleurx catheter this admission, proceed with hospice care.   DVT prophylaxis: SCDs Code Status: DNR Family Communication: Patient only Disposition Plan: Hope for discharge home with hospice once abdominal Pleurx  catheters placed  Consultants:   Curbside with general surgery, IR  Procedures:   Paracentesis on 8/11  Antimicrobials:  Anti-infectives (From admission, onward)   Start     Dose/Rate Route Frequency Ordered Stop   07/15/18 1815  azithromycin (ZITHROMAX) 500 mg in sodium chloride 0.9 % 250 mL IVPB  Status:  Discontinued     500 mg 250 mL/hr over 60 Minutes Intravenous Every 24 hours 07/15/18 1813 07/15/18 1813       Subjective: Lying in bed, pleasant, alert.  No complaints other than continued dyspnea (I saw him prior to paracentesis)  Objective: Vitals:   07/15/18 2214 07/16/18 0555 07/16/18 1143 07/16/18 1300  BP: 115/73 100/67 92/62 99/69   Pulse: 77 73 70 65  Resp: 18 18 16 18   Temp: 98.3 F (36.8 C) 98.7 F (37.1 C) 98.5 F (36.9 C) 98.4 F (36.9 C)  TempSrc: Oral Oral Oral Oral  SpO2: 98% 97% 98% 99%  Weight:      Height:        Intake/Output Summary (Last 24 hours) at 07/16/2018 1545 Last data filed at 07/16/2018 1543 Gross per 24 hour  Intake 100 ml  Output 3100 ml  Net -3000 ml   Filed Weights   07/15/18 1242  Weight: 93 kg    Examination:  General exam: Alert, awake, oriented x 3, cachectic with temporal wasting Respiratory system: Clear to auscultation. Respiratory effort normal. Cardiovascular system:RRR. No murmurs, rubs, gallops. Gastrointestinal system: Abdomen is markedly distended with positive fluid wave.  Normal bowel sounds.   Central nervous system: Alert and oriented. No focal neurological deficits. Extremities: No C/C/E, +pedal pulses Skin: No rashes, lesions  or ulcers Psychiatry: Judgement and insight appear normal. Mood & affect appropriate.     Data Reviewed: I have personally reviewed following labs and imaging studies  CBC: Recent Labs  Lab 07/15/18 1252  WBC 7.1  HGB 8.5*  HCT 24.5*  MCV 100.4*  PLT 121*   Basic Metabolic Panel: Recent Labs  Lab 07/15/18 1252  NA 126*  K 4.4  CL 95*  CO2 24  GLUCOSE 146*    BUN 12  CREATININE 0.86  CALCIUM 8.2*   GFR: Estimated Creatinine Clearance: 93.1 mL/min (by C-G formula based on SCr of 0.86 mg/dL). Liver Function Tests: Recent Labs  Lab 07/15/18 1252  AST 58*  ALT 34  ALKPHOS 115  BILITOT 4.7*  PROT 7.5  ALBUMIN 2.4*   No results for input(s): LIPASE, AMYLASE in the last 168 hours. Recent Labs  Lab 07/15/18 1243  AMMONIA 61*   Coagulation Profile: Recent Labs  Lab 07/15/18 1252  INR 1.52   Cardiac Enzymes: No results for input(s): CKTOTAL, CKMB, CKMBINDEX, TROPONINI in the last 168 hours. BNP (last 3 results) No results for input(s): PROBNP in the last 8760 hours. HbA1C: No results for input(s): HGBA1C in the last 72 hours. CBG: No results for input(s): GLUCAP in the last 168 hours. Lipid Profile: No results for input(s): CHOL, HDL, LDLCALC, TRIG, CHOLHDL, LDLDIRECT in the last 72 hours. Thyroid Function Tests: No results for input(s): TSH, T4TOTAL, FREET4, T3FREE, THYROIDAB in the last 72 hours. Anemia Panel: No results for input(s): VITAMINB12, FOLATE, FERRITIN, TIBC, IRON, RETICCTPCT in the last 72 hours. Urine analysis:    Component Value Date/Time   COLORURINE YELLOW 06/22/2018 0131   APPEARANCEUR CLEAR 06/22/2018 0131   LABSPEC 1.005 06/22/2018 0131   PHURINE 5.0 06/22/2018 0131   GLUCOSEU NEGATIVE 06/22/2018 0131   HGBUR MODERATE (A) 06/22/2018 0131   BILIRUBINUR NEGATIVE 06/22/2018 0131   KETONESUR NEGATIVE 06/22/2018 0131   PROTEINUR NEGATIVE 06/22/2018 0131   NITRITE NEGATIVE 06/22/2018 0131   LEUKOCYTESUR NEGATIVE 06/22/2018 0131   Sepsis Labs: @LABRCNTIP (procalcitonin:4,lacticidven:4)  )No results found for this or any previous visit (from the past 240 hour(s)).       Radiology Studies: Dg Thoracic Spine 2 View  Result Date: 07/15/2018 CLINICAL DATA:  Status post fall with back pain. EXAM: THORACIC SPINE 2 VIEWS COMPARISON:  Chest x-ray June 20, 2018, CT thoracic spine December 13, 2006 FINDINGS:  There is no evidence of thoracic spine fracture. There is almost 70% compression deformity of a lower thoracic vertebral body unchanged and chronic compared to prior exams. Alignment is normal. Degenerative joint changes with osteophytosis and narrowed joint space are identified throughout spine. IMPRESSION: No acute fracture or dislocation identified. Chronic compression deformity of a lower thoracic vertebral body. Electronically Signed   By: Sherian ReinWei-Chen  Lin M.D.   On: 07/15/2018 14:20   Dg Lumbar Spine Complete  Result Date: 07/15/2018 CLINICAL DATA:  Status post fall with back pain. EXAM: LUMBAR SPINE - COMPLETE 4+ VIEW COMPARISON:  MR lumbar spine September 09, 2013 FINDINGS: There is no evidence of lumbar spine fracture. Mild chronic compression deformity of L4 and L5 are noted. Alignment is normal. There are degenerative joint changes of the spine with narrowed joint space and osteophyte formation. IMPRESSION: No acute fracture or dislocation. Electronically Signed   By: Sherian ReinWei-Chen  Lin M.D.   On: 07/15/2018 14:22   Dg Shoulder Right  Result Date: 07/15/2018 CLINICAL DATA:  Right shoulder injury last week. EXAM: RIGHT SHOULDER - 2+ VIEW COMPARISON:  None. FINDINGS: No acute fracture or dislocation identified. Prominent osteophyte formation involving the distal clavicle and undersurface of the acromion. Degenerative disease also noted involving the glenohumeral joint. No bony lesions. Soft tissues appear unremarkable. IMPRESSION: No acute fracture identified. Prominent osteophyte formation of the distal clavicle and acromion. Degenerative disease of the glenohumeral joint. Electronically Signed   By: Irish LackGlenn  Yamagata M.D.   On: 07/15/2018 14:19   [place] Paracentesis  Result Date: 07/16/2018 INDICATION: Cirrhosis.  Ascites. EXAM: ULTRASOUND GUIDED RIGHT ABDOMINAL PARACENTESIS MEDICATIONS: None. COMPLICATIONS: None immediate. PROCEDURE: Informed written consent was obtained from the patient after a discussion of  the risks, benefits and alternatives to treatment. A timeout was performed prior to the initiation of the procedure. Initial ultrasound scanning demonstrates a moderate amount of ascites within the right upper abdominal quadrant. The right abdomen was prepped and draped in the usual sterile fashion. 1% lidocaine with epinephrine was used for local anesthesia. Following this, a 19 gauge, 7-cm, Yueh catheter was introduced. An ultrasound image was saved for documentation purposes. The paracentesis was performed. The catheter was removed and a dressing was applied. The patient tolerated the procedure well without immediate post procedural complication. FINDINGS: A total of approximately 4.3 L of yellow fluid was removed. IMPRESSION: Successful ultrasound-guided paracentesis yielding 4.3 liters of peritoneal fluid. Electronically Signed   By: Jeronimo GreavesKyle  Talbot M.D.   On: 07/16/2018 13:30        Scheduled Meds: . docusate sodium  200 mg Oral BID  . ferrous sulfate  325 mg Oral Q breakfast  . folic acid  1 mg Oral Daily  . lactulose  20 g Oral TID  . nadolol  20 mg Oral Daily  . potassium chloride SA  20 mEq Oral BID  . spironolactone  100 mg Oral Daily  . thiamine  100 mg Oral Daily  . torsemide  20 mg Oral Daily   Continuous Infusions:   LOS: 0 days    Time spent: 25 minutes.     Chaya JanEstela Hernandez Acosta, MD [facility] Pager [phone]  If 7PM-7AM, please contact night-coverage [email or web address] Password [email or web address] 07/16/2018, 3:45 PM

## 2018-07-16 NOTE — Care Management Obs Status (Signed)
MEDICARE OBSERVATION STATUS NOTIFICATION   Patient Details  Name: Derrick RossBarry K Oneill MRN: [id number] Date of Birth: 1951/02/25   Medicare Observation Status Notification Given:  Yes    Mirl Hillery, Chrystine OilerSharley Diane, RN 07/16/2018, 3:03 PM

## 2018-07-16 NOTE — Procedures (Signed)
  Follow-Up: [None]    MEDICATIONS: [None.]  COMPLICATIONS: [None immediate.]  PROCEDURE: Informed written consent was obtained from the patient after a discussion of the risks, benefits and alternatives to treatment. A timeout was performed prior to the initiation of the procedure.    Initial ultrasound scanning demonstrates a [moderate] amount of ascites within the right upper abdominal quadrant. The right abdomen was prepped and draped in the usual sterile fashion. 1% lidocaine with epinephrine was used for local anesthesia.     Following this, a [19 gauge, 7-cm, Yueh] catheter was introduced. An ultrasound image was saved for documentation purposes. The paracentesis was performed. The catheter was removed and a dressing was applied. The patient tolerated the procedure well without immediate post procedural complication.   FINDINGS: A total of approximately [4.3 L] of [yellow] fluid was removed.   IMPRESSION:  Successful ultrasound-guided paracentesis yielding [4.3] liters of peritoneal fluid.

## 2018-07-16 NOTE — Progress Notes (Signed)
Rounding completed. Patient sleeping in supine position. No distress noted. Call bell within reach, bed in low position. Will continue to monitor.  

## 2018-07-16 NOTE — Progress Notes (Signed)
Palliative: Derrick Oneill is sitting up in bed.  He greets me making and keeping eye contact.  As usual, he is pleasant and friendly.  He appears weak and frail, acutely/chronically ill.  There is no family at bedside at this time. We talked about Derrick Oneill's health since his last hospitalization.  He tells me that he continues to desire Pleurx drain and home with hospice.  He talks about getting his room ready, hospital bed, his plan for caring for himself as his needs change. Conversation with hospitalist related to Pleurx drain/home with hospice.  In-house surgeon is attempting to reach Pleurx representative to assist with placement of permanent Pleurx drain. 35 minutes Derrick Carmelasha Dove, NP Palliative Medicine Team Team Phone # [phone]  Greater than 50% of this time was spent counseling and coordinating care related to the above assessment and plan.

## 2018-07-16 NOTE — Care Management Note (Addendum)
Case Management Note  Patient Details  Name: Derrick Oneill MRN: [id number] Date of Birth: 12-02-1951  Subjective/Objective: Dyspnea/Ascites secondary to cirrhosis. Pt from home, ind pta. [facility]. Patient here recently for same and discharged home with continuation of home health and referral to hospice for surveillance. Patient continues to open/interested for a pluerx drain if possible and hospice services if possible.  Palliative consulted again.                  Action/Plan: Paracentesis scheduled today. CM following for needs.   Expected Discharge Date:   07/18/2018               Expected Discharge Plan:     In-House Referral:     Discharge planning Services   CM consult, Palliative  Post Acute Care Choice:    Choice offered to:     DME Arranged:    DME Agency:     HH Arranged:    HH Agency:     Status of Service:   in progress  If discussed at [employer] Length of Stay Meetings, dates discussed:    Additional Comments:  Ameshia Pewitt, Chrystine OilerSharley Diane, RN 07/16/2018, 1:28 PM

## 2018-07-17 NOTE — Care Management (Signed)
IR to place pleurx drain tomorrow. [facility]. [facility] notified. Demographics faxed.

## 2018-07-17 NOTE — Progress Notes (Signed)
PROGRESS NOTE    Derrick Oneill  YNW:[id number] DOB: [birth year] DOA: 07/15/2018 [facility]: [facility]     Brief Narrative:  67 year old man admitted from home on 8/11 due to shortness of breath and increased weakness.  He has a history of alcoholic cirrhosis with a situs was undergoing frequent paracentesis, per report he has had 3-4 paracentesis over the past 6 weeks.  During his last hospitalization in July he had accepted hospice care, however that transition has not been made as hospice was unable to accept him unless a Pleurx catheter was placed due to frequency of paracentesis.   Assessment & Plan:   Principal Problem:   Dyspnea Active Problems:   Hepatic cirrhosis (HCC)   Alcohol abuse   Essential hypertension   Renal insufficiency   Alcoholic cirrhosis of liver with ascites (HCC)   Hyponatremia   FTT (failure to thrive) in adult   Ascites   Dyspnea -Suspect secondary to significant abdominal ascites, improved status post paracentesis.  Alcoholic cirrhosis of the liver with ascites -Had paracentesis performed on 8/11 with removal of 4.3 L of ascitic fluid. -Patient is willing to proceed with hospice care.  Hospice is requesting abdominal Pleurx catheter placed.  -Have discussed case with IR, Pleurx tube to be placed on Wednesday 8/14. -Afterwards should be able to proceed with discharge home with hospice.  Chronic hyponatremia -This is hypervolemic hyponatremia due to cirrhosis and volume overload. -Patient is not interested in having frequent blood draws to have this checked.  Severe protein caloric malnutrition/failure to thrive -Patient will be proceeding towards hospice care.  Alcoholic cirrhosis of the liver end stage -Continue lactulose, hopefully for Pleurx catheter this admission, proceed with hospice care.   DVT prophylaxis: SCDs Code Status: DNR Family Communication: Patient only Disposition Plan: Hope for discharge home with hospice once  abdominal Pleurx catheters placed  Consultants:   IR  Procedures:   Paracentesis on 8/11  Antimicrobials:  Anti-infectives (From admission, onward)   Start     Dose/Rate Route Frequency Ordered Stop   07/15/18 1815  azithromycin (ZITHROMAX) 500 mg in sodium chloride 0.9 % 250 mL IVPB  Status:  Discontinued     500 mg 250 mL/hr over 60 Minutes Intravenous Every 24 hours 07/15/18 1813 07/15/18 1813       Subjective: In bed, pleasant, alert.  Objective: Vitals:   07/16/18 2122 07/17/18 0451 07/17/18 0452 07/17/18 1400  BP: (!) 101/58 101/72  (!) 101/58  Pulse: 72 77 91 71  Resp: 18 18  18   Temp: 98.5 F (36.9 C) 98.4 F (36.9 C)  98.4 F (36.9 C)  TempSrc: Oral Oral  Oral  SpO2: 97% (!) 84% 100% 100%  Weight:      Height:        Intake/Output Summary (Last 24 hours) at 07/17/2018 1447 Last data filed at 07/17/2018 1424 Gross per 24 hour  Intake 840 ml  Output 400 ml  Net 440 ml   Filed Weights   07/15/18 1242  Weight: 93 kg    Examination:  General exam: Alert, awake, oriented x 3 Respiratory system: Clear to auscultation. Respiratory effort normal. Cardiovascular system:RRR. No murmurs, rubs, gallops. Gastrointestinal system: Abdomen is distended. Central nervous system: Alert and oriented. No focal neurological deficits. Extremities: No C/C/E, +pedal pulses Skin: No rashes, lesions or ulcers Psychiatry: Judgement and insight appear normal. Mood & affect appropriate.       Data Reviewed: I have personally reviewed following labs and imaging studies  CBC: Recent Labs  Lab 07/15/18 1252  WBC 7.1  HGB 8.5*  HCT 24.5*  MCV 100.4*  PLT 121*   Basic Metabolic Panel: Recent Labs  Lab 07/15/18 1252  NA 126*  K 4.4  CL 95*  CO2 24  GLUCOSE 146*  BUN 12  CREATININE 0.86  CALCIUM 8.2*   GFR: Estimated Creatinine Clearance: 93.1 mL/min (by C-G formula based on SCr of 0.86 mg/dL). Liver Function Tests: Recent Labs  Lab 07/15/18 1252  AST  58*  ALT 34  ALKPHOS 115  BILITOT 4.7*  PROT 7.5  ALBUMIN 2.4*   No results for input(s): LIPASE, AMYLASE in the last 168 hours. Recent Labs  Lab 07/15/18 1243  AMMONIA 61*   Coagulation Profile: Recent Labs  Lab 07/15/18 1252  INR 1.52   Cardiac Enzymes: No results for input(s): CKTOTAL, CKMB, CKMBINDEX, TROPONINI in the last 168 hours. BNP (last 3 results) No results for input(s): PROBNP in the last 8760 hours. HbA1C: No results for input(s): HGBA1C in the last 72 hours. CBG: No results for input(s): GLUCAP in the last 168 hours. Lipid Profile: No results for input(s): CHOL, HDL, LDLCALC, TRIG, CHOLHDL, LDLDIRECT in the last 72 hours. Thyroid Function Tests: No results for input(s): TSH, T4TOTAL, FREET4, T3FREE, THYROIDAB in the last 72 hours. Anemia Panel: No results for input(s): VITAMINB12, FOLATE, FERRITIN, TIBC, IRON, RETICCTPCT in the last 72 hours. Urine analysis:    Component Value Date/Time   COLORURINE YELLOW 06/22/2018 0131   APPEARANCEUR CLEAR 06/22/2018 0131   LABSPEC 1.005 06/22/2018 0131   PHURINE 5.0 06/22/2018 0131   GLUCOSEU NEGATIVE 06/22/2018 0131   HGBUR MODERATE (A) 06/22/2018 0131   BILIRUBINUR NEGATIVE 06/22/2018 0131   KETONESUR NEGATIVE 06/22/2018 0131   PROTEINUR NEGATIVE 06/22/2018 0131   NITRITE NEGATIVE 06/22/2018 0131   LEUKOCYTESUR NEGATIVE 06/22/2018 0131   Sepsis Labs: @LABRCNTIP (procalcitonin:4,lacticidven:4)  )No results found for this or any previous visit (from the past 240 hour(s)).       Radiology Studies: [place] Paracentesis  Result Date: 07/16/2018 INDICATION: Cirrhosis.  Ascites. EXAM: ULTRASOUND GUIDED RIGHT ABDOMINAL PARACENTESIS MEDICATIONS: None. COMPLICATIONS: None immediate. PROCEDURE: Informed written consent was obtained from the patient after a discussion of the risks, benefits and alternatives to treatment. A timeout was performed prior to the initiation of the procedure. Initial ultrasound scanning  demonstrates a moderate amount of ascites within the right upper abdominal quadrant. The right abdomen was prepped and draped in the usual sterile fashion. 1% lidocaine with epinephrine was used for local anesthesia. Following this, a 19 gauge, 7-cm, Yueh catheter was introduced. An ultrasound image was saved for documentation purposes. The paracentesis was performed. The catheter was removed and a dressing was applied. The patient tolerated the procedure well without immediate post procedural complication. FINDINGS: A total of approximately 4.3 L of yellow fluid was removed. IMPRESSION: Successful ultrasound-guided paracentesis yielding 4.3 liters of peritoneal fluid. Electronically Signed   By: Jeronimo GreavesKyle  Talbot M.D.   On: 07/16/2018 13:30        Scheduled Meds: . docusate sodium  200 mg Oral BID  . ferrous sulfate  325 mg Oral Q breakfast  . folic acid  1 mg Oral Daily  . lactulose  20 g Oral TID  . nadolol  20 mg Oral Daily  . potassium chloride SA  20 mEq Oral BID  . spironolactone  100 mg Oral Daily  . thiamine  100 mg Oral Daily  . torsemide  20 mg Oral Daily   Continuous  Infusions:   LOS: 1 day    Time spent: 25 minutes.     Chaya JanEstela Hernandez Acosta, MD [facility] Pager [phone]  If 7PM-7AM, please contact night-coverage [email or web address] Password [email or web address] 07/17/2018, 2:47 PM

## 2018-07-17 NOTE — Care Management (Signed)
Patient Information   Patient Name Thresa RossBoulding, Joon K ([id number]) Sex Male DOB 01-17-1951  Room Bed  A302 A302-01  Patient Demographics   Address [address] [place] [address] Phone [phone] (Home)  Patient Ethnicity & Race   Ethnic Group Patient Race  Not Hispanic or Latino White or Caucasian  Emergency Contact(s)   Name Relation Home Work Mobile  AllenBoulding,Brandon Son [phone]    Staat,Tabitha Daughter [phone]    Helane RimaMANNING,TERESA Sister   [phone]  Documents on File    Status Date Received Description  Documents for the Patient  [place] HIPAA NOTICE OF PRIVACY - Scanned Not Received    [facility] E-Signature HIPAA Notice of Privacy Signed 05/29/18   Driver's License Not Received  [place] [address]  Insurance Card Received 07/15/18 Medicare 2019  Advance Directives/Living Will/HCPOA/POA Not Received    Other Photo ID Not Received    Advance Directives/Living Will/HCPOA/POA  03/04/16   Advance Directives/Living Will/HCPOA/POA  03/04/16   HIM ROI Authorization  03/10/16 Patient Accounting / Crawford Givensuby J McClinton  HIM ROI Authorization  08/16/16 Patient Accounting / Crawford Givensuby J McClinton  Consent Form     HIM ROI Authorization  06/19/18 Patient Accounting / Crawford Givensuby J McClinton  HIM ROI Authorization  06/22/18 Patient Accounting / Crawford Givensuby J McClinton  Consent Form   paracentesis  Advance Directives/Living Will/HCPOA/POA Received 06/28/18   Release of Information Received 06/28/18   Insurance Card Received 07/15/18 [place]  Patient Photo   Photo of Patient  HIM Release of Information Output  06/19/18 Requested records  HIM Release of Information Output  06/22/18 Requested records  Documents for the Encounter  AOB (Assignment of Insurance Benefits) Not Received    E-signature AOB Signed 07/15/18   MEDICARE RIGHTS Not Received    E-signature Medicare Rights Signed 07/15/18   ED Patient Billing Extract   ED PB Billing Extract  Medicare Observation  07/16/18   Consent Form      Admission Information   Attending Provider Admitting Provider Admission Type Admission Date/Time  Philip AspenHernandez Acosta, Limmie PatriciaEstela Y, MD Levie HeritageStinson, Jacob J, DO Emergency 07/15/18 1231  Discharge Date Hospital Service Auth/Cert Status Service Area   Internal [facility]  Unit Room/Bed Admission Status   AP-DEPT 300 A302/A302-01 Admission (Confirmed)   Admission   Complaint  sepsis  Hospital Account   Name Acct ID Class Status Primary Coverage  Thresa RossBoulding, Deston K [id number] Inpatient Open MEDICARE - MEDICARE PART A AND B      Guarantor Account (for Hospital Account [id number])   Name Relation to Pt Service Area Active? Acct Type  Thresa RossBoulding, Mouhamadou K Self CHSA Yes Personal/Family  Address Phone    [address] [place], [place] [address] [phone](H)        Coverage Information (for Hospital Account [id number])   F/O Payor/Plan Precert #  MEDICARE/MEDICARE PART A AND B   Subscriber Subscriber #  Thresa RossBoulding, Kerwin K [id number]  Address Phone  [address] [place] [id number]

## 2018-07-18 ENCOUNTER — Inpatient Hospital Stay (HOSPITAL_COMMUNITY): Payer: Medicare Other

## 2018-07-18 DIAGNOSIS — K7031 Alcoholic cirrhosis of liver with ascites: Principal | ICD-10-CM

## 2018-07-18 DIAGNOSIS — I10 Essential (primary) hypertension: Secondary | ICD-10-CM

## 2018-07-18 DIAGNOSIS — R627 Adult failure to thrive: Secondary | ICD-10-CM

## 2018-07-18 LAB — GLUCOSE, CAPILLARY: Glucose-Capillary: 136 mg/dL — ABNORMAL HIGH (ref 70–99)

## 2018-07-18 NOTE — Progress Notes (Signed)
PROGRESS NOTE                                                                                                                                                                                                             Patient Demographics:    Derrick RuizBarry Oneill, is a 67 y.o. male, DOB - March 13, 1951, RKY:[id number]  Admit date - 07/15/2018   Admitting Physician Levie HeritageJacob J Stinson, DO  Outpatient Primary MD for the patient is [facility]  LOS - 2  Outpatient Specialists: none  Chief Complaint  Patient presents with  . Shoulder Pain  . Ascites  . Weakness       Brief Narrative   67 year old male with decompensated alcoholic liver cirrhosis with ascites, hypertension who was hospitalized 4 weeks back for ascites requiring large-volume paracentesis. Hospital course was also complicated with bilateral leg cellulitis and SVT requiring amiodarone infusion. Patient was discharged home with home hospice with plan on place catheter placement if frequent refilling. Patient presented with abdominal distention and underwent large-volume paracentesis with 4.3 L yellow fluid removed. He was scheduled for placement and home with home hospice but repeat abdominal ultrasound shows minimal ascites.   Subjective:   Denies any abdominal pain. Complains of being hungry.   Assessment  & Plan :    Principal problem decompensated liver cirrhosis with  Ascites Patient has been having frequent ascites fluid buildup in the past few weeks. Had large volume paracentesis on 8/12. Patient is interested in having a peritoneal Pleurx catheter placed in and go home with home hospice. However repeat abdominal ultrasound today  shows minimal ascites fluid. I think it would be reasonable to have him stay another 48 hours and get a repeat abdominal ultrasound and if has significant reaccumulation, ask IR to place a Pleurx catheter for discharge and  home hospice follow-up. Electrolytes stable.   Hypervolemic hyponatremia Secondary to cirrhosis.  Fair to thrive/severe protein calorie malnutrition Plan on home hospice.        Code Status : DO NOT RESUSCITATE  Family Communication  : none at bedside  Disposition Plan  : with home hospice services after Pleurx catheter placed in versus home with intermittent paracentesis  Barriers For Discharge : active symptoms  Consults  :  IR, palliative care  Procedures  :large-volume paracentesis  DVT Prophylaxis  : SCD  Lab Results  Component Value Date   PLT 121 (L) 07/15/2018    Antibiotics  :    Anti-infectives (From admission, onward)   Start     Dose/Rate Route Frequency Ordered Stop   07/15/18 1815  azithromycin (ZITHROMAX) 500 mg in sodium chloride 0.9 % 250 mL IVPB  Status:  Discontinued     500 mg 250 mL/hr over 60 Minutes Intravenous Every 24 hours 07/15/18 1813 07/15/18 1813        Objective:   Vitals:   07/17/18 1400 07/17/18 2130 07/18/18 0524 07/18/18 1457  BP: (!) 101/58 119/72 96/60 102/66  Pulse: 71 72 79 94  Resp: 18 18  16   Temp: 98.4 F (36.9 C) 98.5 F (36.9 C) 98.4 F (36.9 C) 98.2 F (36.8 C)  TempSrc: Oral Oral Oral Oral  SpO2: 100% 100% 94% 96%  Weight:      Height:        Wt Readings from Last 3 Encounters:  07/15/18 93 kg  06/26/18 79.3 kg  06/01/18 81.1 kg     Intake/Output Summary (Last 24 hours) at 07/18/2018 1614 Last data filed at 07/18/2018 1400 Gross per 24 hour  Intake 960 ml  Output 800 ml  Net 160 ml     Physical Exam  Gen: not in distress HEENT: pallor present,, moist mucosa, supple neck Chest: clear b/l, no added sounds CVS: N S1&S2, no murmurs,  GI: abdominal distention with ascites, nontender Musculoskeletal: warm, no edema     Data Review:    CBC Recent Labs  Lab 07/15/18 1252  WBC 7.1  HGB 8.5*  HCT 24.5*  PLT 121*  MCV 100.4*  MCH 34.8*  MCHC 34.7  RDW 18.1*    Chemistries    Recent Labs  Lab 07/15/18 1252  NA 126*  K 4.4  CL 95*  CO2 24  GLUCOSE 146*  BUN 12  CREATININE 0.86  CALCIUM 8.2*  AST 58*  ALT 34  ALKPHOS 115  BILITOT 4.7*   ------------------------------------------------------------------------------------------------------------------ No results for input(s): CHOL, HDL, LDLCALC, TRIG, CHOLHDL, LDLDIRECT in the last 72 hours.  Lab Results  Component Value Date   HGBA1C 5.4 02/29/2016   ------------------------------------------------------------------------------------------------------------------ No results for input(s): TSH, T4TOTAL, T3FREE, THYROIDAB in the last 72 hours.  Invalid input(s): FREET3 ------------------------------------------------------------------------------------------------------------------ No results for input(s): VITAMINB12, FOLATE, FERRITIN, TIBC, IRON, RETICCTPCT in the last 72 hours.  Coagulation profile Recent Labs  Lab 07/15/18 1252  INR 1.52    No results for input(s): DDIMER in the last 72 hours.  Cardiac Enzymes No results for input(s): CKMB, TROPONINI, MYOGLOBIN in the last 168 hours.  Invalid input(s): CK ------------------------------------------------------------------------------------------------------------------ No results found for: BNP  Inpatient Medications  Scheduled Meds: . docusate sodium  200 mg Oral BID  . ferrous sulfate  325 mg Oral Q breakfast  . folic acid  1 mg Oral Daily  . lactulose  20 g Oral TID  . nadolol  20 mg Oral Daily  . potassium chloride SA  20 mEq Oral BID  . spironolactone  100 mg Oral Daily  . thiamine  100 mg Oral Daily  . torsemide  20 mg Oral Daily   Continuous Infusions: PRN Meds:.acetaminophen **OR** acetaminophen, clobetasol ointment, ondansetron **OR** ondansetron (ZOFRAN) IV  Micro Results No results found for this or any previous visit (from the past 240 hour(s)).  Radiology Reports Dg Chest 2 View  Result Date:  06/20/2018 CLINICAL DATA:  Cough, shortness of breath EXAM: CHEST - 2 VIEW COMPARISON:  05/31/2018 FINDINGS: Trace left pleural effusion. Pulmonary vascular congestion. No frank interstitial edema, improved. No pneumothorax. The heart is top-normal in size. IMPRESSION: Trace left pleural effusion. Pulmonary vascular congestion. No frank interstitial edema, improved. Electronically Signed   By: Charline BillsSriyesh  Krishnan M.D.   On: 06/20/2018 17:42   Dg Thoracic Spine 2 View  Result Date: 07/15/2018 CLINICAL DATA:  Status post fall with back pain. EXAM: THORACIC SPINE 2 VIEWS COMPARISON:  Chest x-ray June 20, 2018, CT thoracic spine December 13, 2006 FINDINGS: There is no evidence of thoracic spine fracture. There is almost 70% compression deformity of a lower thoracic vertebral body unchanged and chronic compared to prior exams. Alignment is normal. Degenerative joint changes with osteophytosis and narrowed joint space are identified throughout spine. IMPRESSION: No acute fracture or dislocation identified. Chronic compression deformity of a lower thoracic vertebral body. Electronically Signed   By: Sherian ReinWei-Chen  Lin M.D.   On: 07/15/2018 14:20   Dg Lumbar Spine Complete  Result Date: 07/15/2018 CLINICAL DATA:  Status post fall with back pain. EXAM: LUMBAR SPINE - COMPLETE 4+ VIEW COMPARISON:  MR lumbar spine September 09, 2013 FINDINGS: There is no evidence of lumbar spine fracture. Mild chronic compression deformity of L4 and L5 are noted. Alignment is normal. There are degenerative joint changes of the spine with narrowed joint space and osteophyte formation. IMPRESSION: No acute fracture or dislocation. Electronically Signed   By: Sherian ReinWei-Chen  Lin M.D.   On: 07/15/2018 14:22   Dg Shoulder Right  Result Date: 07/15/2018 CLINICAL DATA:  Right shoulder injury last week. EXAM: RIGHT SHOULDER - 2+ VIEW COMPARISON:  None. FINDINGS: No acute fracture or dislocation identified. Prominent osteophyte formation involving the distal  clavicle and undersurface of the acromion. Degenerative disease also noted involving the glenohumeral joint. No bony lesions. Soft tissues appear unremarkable. IMPRESSION: No acute fracture identified. Prominent osteophyte formation of the distal clavicle and acromion. Degenerative disease of the glenohumeral joint. Electronically Signed   By: Irish LackGlenn  Yamagata M.D.   On: 07/15/2018 14:19   [place] Paracentesis  Result Date: 07/16/2018 INDICATION: Cirrhosis.  Ascites. EXAM: ULTRASOUND GUIDED RIGHT ABDOMINAL PARACENTESIS MEDICATIONS: None. COMPLICATIONS: None immediate. PROCEDURE: Informed written consent was obtained from the patient after a discussion of the risks, benefits and alternatives to treatment. A timeout was performed prior to the initiation of the procedure. Initial ultrasound scanning demonstrates a moderate amount of ascites within the right upper abdominal quadrant. The right abdomen was prepped and draped in the usual sterile fashion. 1% lidocaine with epinephrine was used for local anesthesia. Following this, a 19 gauge, 7-cm, Yueh catheter was introduced. An ultrasound image was saved for documentation purposes. The paracentesis was performed. The catheter was removed and a dressing was applied. The patient tolerated the procedure well without immediate post procedural complication. FINDINGS: A total of approximately 4.3 L of yellow fluid was removed. IMPRESSION: Successful ultrasound-guided paracentesis yielding 4.3 liters of peritoneal fluid. Electronically Signed   By: Jeronimo GreavesKyle  Talbot M.D.   On: 07/16/2018 13:30   [place] Paracentesis  Result Date: 06/26/2018 INDICATION: Ascites. EXAM: ULTRASOUND GUIDED  PARACENTESIS MEDICATIONS: None. COMPLICATIONS: None immediate. PROCEDURE: Informed written consent was obtained from the patient after a discussion of the risks, benefits and alternatives to treatment. A timeout was performed prior to the initiation of the procedure. Initial ultrasound scanning  demonstrates a large amount of ascites within the right lower abdominal quadrant. The right lower abdomen was prepped and draped in the usual sterile fashion. 1% lidocaine was used for  local anesthesia. Following this, a 6 French catheter was introduced. An ultrasound image was saved for documentation purposes. The paracentesis was performed. The catheter was removed and a dressing was applied. The patient tolerated the procedure well without immediate post procedural complication. FINDINGS: A total of approximately 5.4 L of clear fluid was removed. Samples were sent to the laboratory as requested by the clinical team. IMPRESSION: Successful ultrasound-guided paracentesis yielding 5.4 liters of peritoneal fluid. Electronically Signed   By: Maisie Fus  Register   On: 06/26/2018 12:35   [place] Ascites (abdomen Limited)  Result Date: 07/18/2018 CLINICAL DATA:  Ascites. EXAM: LIMITED ABDOMEN ULTRASOUND FOR ASCITES TECHNIQUE: Limited ultrasound survey for ascites was performed in all four abdominal quadrants. COMPARISON:  None. FINDINGS: Minimal amount of ascites is noted in all 4 quadrants of the abdomen. IMPRESSION: Minimal ascites is noted. Electronically Signed   By: Lupita Raider, M.D.   On: 07/18/2018 11:39   [place] Ascites (abdomen Limited)  Result Date: 06/22/2018 CLINICAL DATA:  Ascites, had 8 L withdrawn by paracentesis 2 days ago EXAM: LIMITED ABDOMEN ULTRASOUND FOR ASCITES TECHNIQUE: Limited ultrasound survey for ascites was performed in all four abdominal quadrants. COMPARISON:  05/30/2018 FINDINGS: Small amount of ascites is seen throughout the abdomen. This is variable in position with changes in patient position. Overall, the amount of fluid present is significantly less than was seen on 05/30/2018. Volume of fluid present is felt insufficient for expected significant therapeutic benefit from paracentesis and paracentesis is not performed at this time. IMPRESSION: Scattered low volume ascites. Paracentesis  not performed at this time. Electronically Signed   By: Ulyses Southward M.D.   On: 06/22/2018 15:05    Time Spent in minutes  25   Elley Harp M.D on 07/18/2018 at 4:14 PM  Between 7am to 7pm - Pager - [phone]  After 7pm go to [email or web address] - password [facility]  [facility] -  Office  [phone]

## 2018-07-18 NOTE — Progress Notes (Signed)
Patient ambulated off unit to vending machines without notifying staff. Patient reminded not to leave unit. Will continue to monitor.

## 2018-07-18 NOTE — Progress Notes (Signed)
Patient ID: Derrick Oneill, male   DOB: 1951/11/24, 67 y.o.   MRN: [id number]   IR awaiting US imaging to evaluate for ascites Must have a good amount of ascites to accommodate PleurX catheter placement  Just had 4.5 L paracentesis 8/12  Plan per MD and IR- was to re image 8/14 to evaluate for ascites Pleurx 8/14 or likely 8/15 am if imaging proves re accumulation   RN aware of plan

## 2018-07-18 NOTE — Progress Notes (Signed)
Patient has been NPO since at least 0200 for PleurX drain placement. Patient verbalized understanding of need to be NPO at 0200.

## 2018-07-19 ENCOUNTER — Inpatient Hospital Stay (HOSPITAL_COMMUNITY): Payer: Medicare Other

## 2018-07-19 LAB — BASIC METABOLIC PANEL
Anion gap: 7 (ref 5–15)
BUN: 13 mg/dL (ref 8–23)
CHLORIDE: 96 mmol/L — AB (ref 98–111)
CO2: 26 mmol/L (ref 22–32)
Calcium: 7.7 mg/dL — ABNORMAL LOW (ref 8.9–10.3)
Creatinine, Ser: 1.03 mg/dL (ref 0.61–1.24)
GFR calc non Af Amer: >60 mL/min (ref >60)
Glucose, Bld: 118 mg/dL — ABNORMAL HIGH (ref 70–99)
POTASSIUM: 3.5 mmol/L (ref 3.5–5.1)
SODIUM: 129 mmol/L — AB (ref 135–145)

## 2018-07-19 NOTE — Progress Notes (Addendum)
Nurse stated in report that when she took out his IV puss came out. No pain or active drainage at this time but the site from where the cath came out is gray. Site cleaned and dressed.Pt is afebrile will continue to monitor temp and site.

## 2018-07-19 NOTE — Progress Notes (Addendum)
PROGRESS NOTE                                                                                                                                                                                                             Patient Demographics:    Derrick Oneill, is a 67 y.o. male, DOB - 18-Aug-1951, ZOX:[id number]  Admit date - 07/15/2018   Admitting Physician Levie Heritage, DO  Outpatient Primary MD for the patient is [facility]  LOS - 3  Outpatient Specialists: none  Chief Complaint  Patient presents with  . Shoulder Pain  . Ascites  . Weakness       Brief Narrative   67 year old male with decompensated alcoholic liver cirrhosis with ascites, hypertension who was hospitalized 4 weeks back for ascites requiring large-volume paracentesis. Hospital course was also complicated with bilateral leg cellulitis and SVT requiring amiodarone infusion. Patient was discharged home with home hospice with plan on place catheter placement if frequent refilling. Patient presented with abdominal distention and underwent large-volume paracentesis with 4.3 L yellow fluid removed. He was scheduled for placement and home with home hospice but repeat abdominal ultrasound shows minimal ascites.   Subjective:   Denies any abdominal pain. No overnight event.   Assessment  & Plan :    Principal problem decompensated liver cirrhosis with Ascites Patient has been having frequent ascites fluid buildup in the past few weeks. Had large volume paracentesis on 8/12. Patient is interested in having a peritoneal Pleurx catheter placed in and go home with home hospice. However repeat abdominal on 8/14 did not show significant fluid reaccumulation for Pleurx catheter placement. Given his frequent rehospitalization I have decided to keep him inpatient and get a repeat ultrasound abdomen tomorrow morning and if that is significant fluid buildup  he can get a place catheter done by IR tomorrow. (Plan discussed with IR). Continue torsemide, Aldactone and nadolol. Continue lactulose 3 times a day.  Hypervolemic hyponatremia Secondary to cirrhosis.  Fair to thrive/severe protein calorie malnutrition Plan on home hospice.        Code Status : DO NOT RESUSCITATE  Family Communication  : none at bedside  Disposition Plan  : with home hospice services after Pleurx catheter placed, possibly tomorrow.  Barriers For Discharge : active symptoms  Consults  :  IR, palliative care  Procedures  :large-volume paracentesis  DVT Prophylaxis  : SCD  Lab Results  Component Value Date   PLT 121 (L) 07/15/2018    Antibiotics  :    Anti-infectives (From admission, onward)   Start     Dose/Rate Route Frequency Ordered Stop   07/15/18 1815  azithromycin (ZITHROMAX) 500 mg in sodium chloride 0.9 % 250 mL IVPB  Status:  Discontinued     500 mg 250 mL/hr over 60 Minutes Intravenous Every 24 hours 07/15/18 1813 07/15/18 1813        Objective:   Vitals:   07/18/18 1457 07/18/18 1929 07/18/18 2114 07/19/18 0602  BP: 102/66  100/63 103/72  Pulse: 94  76 74  Resp: 16  16 18   Temp: 98.2 F (36.8 C)  98.2 F (36.8 C) 98.4 F (36.9 C)  TempSrc: Oral  Oral Oral  SpO2: 96% 95% 97% 98%  Weight:      Height:        Wt Readings from Last 3 Encounters:  07/15/18 93 kg  06/26/18 79.3 kg  06/01/18 81.1 kg     Intake/Output Summary (Last 24 hours) at 07/19/2018 1024 Last data filed at 07/19/2018 0500 Gross per 24 hour  Intake 1200 ml  Output 725 ml  Net 475 ml   Physical exam Not in distress HEENT: Moist mucosa, supple neck Chest: Clear bilaterally neck sign CVS: Normal S1 and S2, no murmurs GI: Abdominal distention with ascites (increased from yesterday), nontender Musculoskeletal: Warm, trace edema.       Data Review:    CBC Recent Labs  Lab 07/15/18 1252  WBC 7.1  HGB 8.5*  HCT 24.5*  PLT 121*  MCV 100.4*    MCH 34.8*  MCHC 34.7  RDW 18.1*    Chemistries  Recent Labs  Lab 07/15/18 1252 07/19/18 0418  NA 126* 129*  K 4.4 3.5  CL 95* 96*  CO2 24 26  GLUCOSE 146* 118*  BUN 12 13  CREATININE 0.86 1.03  CALCIUM 8.2* 7.7*  AST 58*  --   ALT 34  --   ALKPHOS 115  --   BILITOT 4.7*  --    ------------------------------------------------------------------------------------------------------------------ No results for input(s): CHOL, HDL, LDLCALC, TRIG, CHOLHDL, LDLDIRECT in the last 72 hours.  Lab Results  Component Value Date   HGBA1C 5.4 02/29/2016   ------------------------------------------------------------------------------------------------------------------ No results for input(s): TSH, T4TOTAL, T3FREE, THYROIDAB in the last 72 hours.  Invalid input(s): FREET3 ------------------------------------------------------------------------------------------------------------------ No results for input(s): VITAMINB12, FOLATE, FERRITIN, TIBC, IRON, RETICCTPCT in the last 72 hours.  Coagulation profile Recent Labs  Lab 07/15/18 1252  INR 1.52    No results for input(s): DDIMER in the last 72 hours.  Cardiac Enzymes No results for input(s): CKMB, TROPONINI, MYOGLOBIN in the last 168 hours.  Invalid input(s): CK ------------------------------------------------------------------------------------------------------------------ No results found for: BNP  Inpatient Medications  Scheduled Meds: . docusate sodium  200 mg Oral BID  . ferrous sulfate  325 mg Oral Q breakfast  . folic acid  1 mg Oral Daily  . lactulose  20 g Oral TID  . nadolol  20 mg Oral Daily  . potassium chloride SA  20 mEq Oral BID  . spironolactone  100 mg Oral Daily  . thiamine  100 mg Oral Daily  . torsemide  20 mg Oral Daily   Continuous Infusions: PRN Meds:.acetaminophen **OR** acetaminophen, clobetasol ointment, ondansetron **OR** ondansetron (ZOFRAN) IV  Micro Results No results found for this  or any previous visit (from the  past 240 hour(s)).  Radiology Reports Dg Chest 2 View  Result Date: 06/20/2018 CLINICAL DATA:  Cough, shortness of breath EXAM: CHEST - 2 VIEW COMPARISON:  05/31/2018 FINDINGS: Trace left pleural effusion. Pulmonary vascular congestion. No frank interstitial edema, improved. No pneumothorax. The heart is top-normal in size. IMPRESSION: Trace left pleural effusion. Pulmonary vascular congestion. No frank interstitial edema, improved. Electronically Signed   By: Charline Bills M.D.   On: 06/20/2018 17:42   Dg Thoracic Spine 2 View  Result Date: 07/15/2018 CLINICAL DATA:  Status post fall with back pain. EXAM: THORACIC SPINE 2 VIEWS COMPARISON:  Chest x-ray June 20, 2018, CT thoracic spine December 13, 2006 FINDINGS: There is no evidence of thoracic spine fracture. There is almost 70% compression deformity of a lower thoracic vertebral body unchanged and chronic compared to prior exams. Alignment is normal. Degenerative joint changes with osteophytosis and narrowed joint space are identified throughout spine. IMPRESSION: No acute fracture or dislocation identified. Chronic compression deformity of a lower thoracic vertebral body. Electronically Signed   By: Sherian Rein M.D.   On: 07/15/2018 14:20   Dg Lumbar Spine Complete  Result Date: 07/15/2018 CLINICAL DATA:  Status post fall with back pain. EXAM: LUMBAR SPINE - COMPLETE 4+ VIEW COMPARISON:  MR lumbar spine September 09, 2013 FINDINGS: There is no evidence of lumbar spine fracture. Mild chronic compression deformity of L4 and L5 are noted. Alignment is normal. There are degenerative joint changes of the spine with narrowed joint space and osteophyte formation. IMPRESSION: No acute fracture or dislocation. Electronically Signed   By: Sherian Rein M.D.   On: 07/15/2018 14:22   Dg Shoulder Right  Result Date: 07/15/2018 CLINICAL DATA:  Right shoulder injury last week. EXAM: RIGHT SHOULDER - 2+ VIEW COMPARISON:  None.  FINDINGS: No acute fracture or dislocation identified. Prominent osteophyte formation involving the distal clavicle and undersurface of the acromion. Degenerative disease also noted involving the glenohumeral joint. No bony lesions. Soft tissues appear unremarkable. IMPRESSION: No acute fracture identified. Prominent osteophyte formation of the distal clavicle and acromion. Degenerative disease of the glenohumeral joint. Electronically Signed   By: Irish Lack M.D.   On: 07/15/2018 14:19   US Paracentesis  Result Date: 07/16/2018 INDICATION: Cirrhosis.  Ascites. EXAM: ULTRASOUND GUIDED RIGHT ABDOMINAL PARACENTESIS MEDICATIONS: None. COMPLICATIONS: None immediate. PROCEDURE: Informed written consent was obtained from the patient after a discussion of the risks, benefits and alternatives to treatment. A timeout was performed prior to the initiation of the procedure. Initial ultrasound scanning demonstrates a moderate amount of ascites within the right upper abdominal quadrant. The right abdomen was prepped and draped in the usual sterile fashion. 1% lidocaine with epinephrine was used for local anesthesia. Following this, a 19 gauge, 7-cm, Yueh catheter was introduced. An ultrasound image was saved for documentation purposes. The paracentesis was performed. The catheter was removed and a dressing was applied. The patient tolerated the procedure well without immediate post procedural complication. FINDINGS: A total of approximately 4.3 L of yellow fluid was removed. IMPRESSION: Successful ultrasound-guided paracentesis yielding 4.3 liters of peritoneal fluid. Electronically Signed   By: Jeronimo Greaves M.D.   On: 07/16/2018 13:30   US Paracentesis  Result Date: 06/26/2018 INDICATION: Ascites. EXAM: ULTRASOUND GUIDED  PARACENTESIS MEDICATIONS: None. COMPLICATIONS: None immediate. PROCEDURE: Informed written consent was obtained from the patient after a discussion of the risks, benefits and alternatives to  treatment. A timeout was performed prior to the initiation of the procedure. Initial ultrasound scanning demonstrates a  large amount of ascites within the right lower abdominal quadrant. The right lower abdomen was prepped and draped in the usual sterile fashion. 1% lidocaine was used for local anesthesia. Following this, a 6 French catheter was introduced. An ultrasound image was saved for documentation purposes. The paracentesis was performed. The catheter was removed and a dressing was applied. The patient tolerated the procedure well without immediate post procedural complication. FINDINGS: A total of approximately 5.4 L of clear fluid was removed. Samples were sent to the laboratory as requested by the clinical team. IMPRESSION: Successful ultrasound-guided paracentesis yielding 5.4 liters of peritoneal fluid. Electronically Signed   By: Maisie Fushomas  Register   On: 06/26/2018 12:35   [place] Ascites (abdomen Limited)  Result Date: 07/18/2018 CLINICAL DATA:  Ascites. EXAM: LIMITED ABDOMEN ULTRASOUND FOR ASCITES TECHNIQUE: Limited ultrasound survey for ascites was performed in all four abdominal quadrants. COMPARISON:  None. FINDINGS: Minimal amount of ascites is noted in all 4 quadrants of the abdomen. IMPRESSION: Minimal ascites is noted. Electronically Signed   By: Lupita RaiderJames  Green Jr, M.D.   On: 07/18/2018 11:39   [place] Ascites (abdomen Limited)  Result Date: 06/22/2018 CLINICAL DATA:  Ascites, had 8 L withdrawn by paracentesis 2 days ago EXAM: LIMITED ABDOMEN ULTRASOUND FOR ASCITES TECHNIQUE: Limited ultrasound survey for ascites was performed in all four abdominal quadrants. COMPARISON:  05/30/2018 FINDINGS: Small amount of ascites is seen throughout the abdomen. This is variable in position with changes in patient position. Overall, the amount of fluid present is significantly less than was seen on 05/30/2018. Volume of fluid present is felt insufficient for expected significant therapeutic benefit from  paracentesis and paracentesis is not performed at this time. IMPRESSION: Scattered low volume ascites. Paracentesis not performed at this time. Electronically Signed   By: Ulyses SouthwardMark  Boles M.D.   On: 06/22/2018 15:05    Time Spent in minutes  25   Mead Slane M.D on 07/19/2018 at 10:24 AM  Between 7am to 7pm - Pager - [phone]  After 7pm go to [email or web address] - password [email or web address]  [facility] -  Office  [phone]

## 2018-07-19 NOTE — Progress Notes (Signed)
Palliative: [name] is sitting up on the edge of the bed.  He greets me making and keeping eye contact.  He appears acutely/chronically ill and somewhat frail.  He is covered in scabs and open sores related to self injury due to falls.  His bilateral lower extremities are somewhat swollen and red.  There is no family at bedside at this time. We talked about his continued desire for Pleurx drain at home with hospice.  He tells me that he is being patient, awaiting drain placement and discharge.  No questions or concerns at this time.  Encouraged to reach out to nursing staff as needed. 15 minutes [name], NP Palliative Medicine Team Team Phone # [phone]  Greater than 50% of this time was spent counseling and coordinating care related to the above assessment and plan

## 2018-07-19 NOTE — Progress Notes (Signed)
Patient ID: Derrick RossBarry K Oneill, male   DOB: 05-15-1951, 67 y.o.   MRN: [id number]  Will recheck Limited Abd US to check for ascites in am Will try to move forward with Peritoneal PleurX catheter placement if ascites has re accumulated enough for same. Orders placed for npo and INR  Will call RN tomorrow am if we plan to move ahead Will need ambulance arranged as quickly as possible

## 2018-07-20 ENCOUNTER — Inpatient Hospital Stay (HOSPITAL_COMMUNITY): Payer: Medicare Other

## 2018-07-20 DIAGNOSIS — R531 Weakness: Secondary | ICD-10-CM

## 2018-07-20 LAB — PROTIME-INR
INR: 1.51
Prothrombin Time: 18.1 seconds — ABNORMAL HIGH (ref 11.4–15.2)

## 2018-07-20 NOTE — Discharge Summary (Signed)
Physician Discharge Summary  Derrick Oneill ZOX:[id number] DOB: March 18, 1951 DOA: 07/15/2018  PCP: [facility]  Admit date: 07/15/2018 Discharge date: 07/20/2018  Admitted From: Home Disposition: Home  Recommendations for Outpatient Follow-up:  IR will arrange outpatient follow-up for ultrasound and possible Pleurx drain placement in 1-2 weeks. Patient will then be followed at [facility].  If patient returns to ED for increasing abdominal distention before he gets a Pleurx catheter placement as outpatient please try not to get large-volume paracentesis and possibly get a Pleurx catheter placed by IR as inpatient.   Home Health: None Equipment/Devices: None  Discharge Condition: Fair CODE STATUS: DNR Diet recommendation: Regular    Discharge Diagnoses:  Principal Problem:   Alcoholic cirrhosis of liver with ascites (HCC)   Active Problems:   Hepatic cirrhosis (HCC)   Alcohol abuse   Essential hypertension   Hyponatremia   FTT (failure to thrive) in adult   Dyspnea  Brief narrative/HPI 67 year old male with decompensated alcoholic liver cirrhosis with ascites, hypertension who was hospitalized 4 weeks back for ascites requiring large-volume paracentesis. Hospital course was also complicated with bilateral leg cellulitis and SVT requiring amiodarone infusion. Patient was discharged home with home hospice with plan on place catheter placement if frequent refilling. Patient presented with abdominal distention and underwent large-volume paracentesis with 4.3 L yellow fluid removed. He was scheduled for placement and home with home hospice but repeat abdominal ultrasound shows minimal ascites.  Principal problem decompensated liver cirrhosis with Ascites Patient has been having frequent ascites fluid buildup in the past few weeks. Had large volume paracentesis on 8/12. Patient is interested in having a peritoneal Pleurx catheter placed in and go home  with home hospice. Repeat abdominal ultrasound last 2 days does not show significant fluid reaccumulation for Pleurx catheter placement.  I do not think he will have significant reaccumulation over the weekend to have a repeat ultrasound done early next week.  I spoke with IR and they will arrange outpatient follow-up in the next 1-2 weeks with an ultrasound and possibly place a Pleurx catheter.  Following this he can be followed by home hospice. Discussed plan with patient and he agrees with it.  Says his sisters can drive him to [facility] radiology for the ultrasound.  I will place an order for IR follow-up after discharge.  Continue torsemide, Aldactone and nadolol. Continue lactulose 3 times a day.   This is a second hospitalization for paracentesis in the past 6 weeks.  If he returns to the ED with abdominal distention needing paracentesis before he is seen by IR for Pleurx catheter placement it would be wise to hold off on large-volume paracentesis and consult IR to have a Pleurx catheter placed in.   Hypervolemic hyponatremia Secondary to cirrhosis.  Fair to thrive/severe protein calorie malnutrition Plan on home hospice once patient has a Pleurx catheter placed in.      Family Communication  : none at bedside  Disposition Plan  :  Home  Consults  :  IR, palliative care  Procedures  :large-volume paracentesis   Discharge Instructions   Allergies as of 07/20/2018   No Known Allergies     Medication List    TAKE these medications   cholecalciferol 1000 units tablet Commonly known as:  VITAMIN D Take 1,000 Units by mouth daily.   clobetasol cream 0.05 % Commonly known as:  TEMOVATE Apply 1 application topically 2 (two) times daily as needed (Apply a thin layer as  needed for psoriasis).   docusate sodium 100 MG capsule Commonly known as:  COLACE Take 200 mg by mouth 2 (two) times daily.   ferrous sulfate 325 (65 FE) MG tablet Take 325 mg by mouth daily  with breakfast.   folic acid 1 MG tablet Commonly known as:  FOLVITE Take 1 mg by mouth daily.   lactulose 10 GM/15ML solution Commonly known as:  CHRONULAC Take 20 g by mouth 3 (three) times daily.   multivitamin with minerals Tabs tablet Take 1 tablet by mouth daily.   nadolol 20 MG tablet Commonly known as:  CORGARD Take 1 tablet (20 mg total) by mouth daily.   potassium chloride SA 20 MEQ tablet Commonly known as:  K-DUR,KLOR-CON Take 20 mEq by mouth 2 (two) times daily.   spironolactone 100 MG tablet Commonly known as:  ALDACTONE Take 1 tablet (100 mg total) by mouth daily.   thiamine 100 MG tablet Take 100 mg by mouth daily.   torsemide 20 MG tablet Commonly known as:  DEMADEX Take 1 tablet (20 mg total) by mouth daily.   Vitamin D (Ergocalciferol) 50000 units Caps capsule Commonly known as:  DRISDOL Take 50,000 Units by mouth every 7 (seven) days.      Follow-up Information    Richarda OverlieHenn, Adam, MD Follow up in 2 week(s).   Specialties:  Interventional Radiology, Radiology Why:  office will call with appt for ultrasound.. please call the office if you do not get a call within 1 week Contact information: [address] [place] [address] [phone]          No Known Allergies   Procedures/Studies: Dg Chest 2 View  Result Date: 06/20/2018 CLINICAL DATA:  Cough, shortness of breath EXAM: CHEST - 2 VIEW COMPARISON:  05/31/2018 FINDINGS: Trace left pleural effusion. Pulmonary vascular congestion. No frank interstitial edema, improved. No pneumothorax. The heart is top-normal in size. IMPRESSION: Trace left pleural effusion. Pulmonary vascular congestion. No frank interstitial edema, improved. Electronically Signed   By: Charline BillsSriyesh  Krishnan M.D.   On: 06/20/2018 17:42   Dg Thoracic Spine 2 View  Result Date: 07/15/2018 CLINICAL DATA:  Status post fall with back pain. EXAM: THORACIC SPINE 2 VIEWS COMPARISON:  Chest x-ray June 20, 2018, CT thoracic  spine December 13, 2006 FINDINGS: There is no evidence of thoracic spine fracture. There is almost 70% compression deformity of a lower thoracic vertebral body unchanged and chronic compared to prior exams. Alignment is normal. Degenerative joint changes with osteophytosis and narrowed joint space are identified throughout spine. IMPRESSION: No acute fracture or dislocation identified. Chronic compression deformity of a lower thoracic vertebral body. Electronically Signed   By: Sherian ReinWei-Chen  Lin M.D.   On: 07/15/2018 14:20   Dg Lumbar Spine Complete  Result Date: 07/15/2018 CLINICAL DATA:  Status post fall with back pain. EXAM: LUMBAR SPINE - COMPLETE 4+ VIEW COMPARISON:  MR lumbar spine September 09, 2013 FINDINGS: There is no evidence of lumbar spine fracture. Mild chronic compression deformity of L4 and L5 are noted. Alignment is normal. There are degenerative joint changes of the spine with narrowed joint space and osteophyte formation. IMPRESSION: No acute fracture or dislocation. Electronically Signed   By: Sherian ReinWei-Chen  Lin M.D.   On: 07/15/2018 14:22   Dg Shoulder Right  Result Date: 07/15/2018 CLINICAL DATA:  Right shoulder injury last week. EXAM: RIGHT SHOULDER - 2+ VIEW COMPARISON:  None. FINDINGS: No acute fracture or dislocation identified. Prominent osteophyte formation involving the distal clavicle  and undersurface of the acromion. Degenerative disease also noted involving the glenohumeral joint. No bony lesions. Soft tissues appear unremarkable. IMPRESSION: No acute fracture identified. Prominent osteophyte formation of the distal clavicle and acromion. Degenerative disease of the glenohumeral joint. Electronically Signed   By: Irish Lack M.D.   On: 07/15/2018 14:19   US Paracentesis  Result Date: 07/16/2018 INDICATION: Cirrhosis.  Ascites. EXAM: ULTRASOUND GUIDED RIGHT ABDOMINAL PARACENTESIS MEDICATIONS: None. COMPLICATIONS: None immediate. PROCEDURE: Informed written consent was obtained from  the patient after a discussion of the risks, benefits and alternatives to treatment. A timeout was performed prior to the initiation of the procedure. Initial ultrasound scanning demonstrates a moderate amount of ascites within the right upper abdominal quadrant. The right abdomen was prepped and draped in the usual sterile fashion. 1% lidocaine with epinephrine was used for local anesthesia. Following this, a 19 gauge, 7-cm, Yueh catheter was introduced. An ultrasound image was saved for documentation purposes. The paracentesis was performed. The catheter was removed and a dressing was applied. The patient tolerated the procedure well without immediate post procedural complication. FINDINGS: A total of approximately 4.3 L of yellow fluid was removed. IMPRESSION: Successful ultrasound-guided paracentesis yielding 4.3 liters of peritoneal fluid. Electronically Signed   By: Jeronimo Greaves M.D.   On: 07/16/2018 13:30   US Paracentesis  Result Date: 06/26/2018 INDICATION: Ascites. EXAM: ULTRASOUND GUIDED  PARACENTESIS MEDICATIONS: None. COMPLICATIONS: None immediate. PROCEDURE: Informed written consent was obtained from the patient after a discussion of the risks, benefits and alternatives to treatment. A timeout was performed prior to the initiation of the procedure. Initial ultrasound scanning demonstrates a large amount of ascites within the right lower abdominal quadrant. The right lower abdomen was prepped and draped in the usual sterile fashion. 1% lidocaine was used for local anesthesia. Following this, a 6 French catheter was introduced. An ultrasound image was saved for documentation purposes. The paracentesis was performed. The catheter was removed and a dressing was applied. The patient tolerated the procedure well without immediate post procedural complication. FINDINGS: A total of approximately 5.4 L of clear fluid was removed. Samples were sent to the laboratory as requested by the clinical team.  IMPRESSION: Successful ultrasound-guided paracentesis yielding 5.4 liters of peritoneal fluid. Electronically Signed   By: Maisie Fus  Register   On: 06/26/2018 12:35   [place] Ascites (abdomen Limited)  Result Date: 07/19/2018 CLINICAL DATA:  Pre PleurX catheter placement evaluation of ascites. EXAM: LIMITED ABDOMEN ULTRASOUND FOR ASCITES TECHNIQUE: Limited ultrasound survey for ascites was performed in all four abdominal quadrants. COMPARISON:  07/18/2018. FINDINGS: Small to moderate amount of ascites in both lower quadrants of the abdomen. IMPRESSION: Small to moderate amount of ascites in the lower quadrants of the abdomen bilaterally. Electronically Signed   By: Beckie Salts M.D.   On: 07/19/2018 11:35   [place] Ascites (abdomen Limited)  Result Date: 07/18/2018 CLINICAL DATA:  Ascites. EXAM: LIMITED ABDOMEN ULTRASOUND FOR ASCITES TECHNIQUE: Limited ultrasound survey for ascites was performed in all four abdominal quadrants. COMPARISON:  None. FINDINGS: Minimal amount of ascites is noted in all 4 quadrants of the abdomen. IMPRESSION: Minimal ascites is noted. Electronically Signed   By: Lupita Raider, M.D.   On: 07/18/2018 11:39   [place] Ascites (abdomen Limited)  Result Date: 06/22/2018 CLINICAL DATA:  Ascites, had 8 L withdrawn by paracentesis 2 days ago EXAM: LIMITED ABDOMEN ULTRASOUND FOR ASCITES TECHNIQUE: Limited ultrasound survey for ascites was performed in all four abdominal quadrants. COMPARISON:  05/30/2018 FINDINGS: Small amount  of ascites is seen throughout the abdomen. This is variable in position with changes in patient position. Overall, the amount of fluid present is significantly less than was seen on 05/30/2018. Volume of fluid present is felt insufficient for expected significant therapeutic benefit from paracentesis and paracentesis is not performed at this time. IMPRESSION: Scattered low volume ascites. Paracentesis not performed at this time. Electronically Signed   By: Ulyses SouthwardMark  Boles M.D.   On:  06/22/2018 15:05       Subjective: Not in distress.  Discharge Exam: Vitals:   07/19/18 2142 07/20/18 0624  BP: 96/69 100/61  Pulse: 78 76  Resp: 18 18  Temp: 98.5 F (36.9 C) 98.5 F (36.9 C)  SpO2: 100% 97%   Vitals:   07/19/18 1927 07/19/18 2100 07/19/18 2142 07/20/18 0624  BP:   96/69 100/61  Pulse:   78 76  Resp:   18 18  Temp:  98.7 F (37.1 C) 98.5 F (36.9 C) 98.5 F (36.9 C)  TempSrc:  Oral Oral Oral  SpO2: 96%  100% 97%  Weight:      Height:        Not in distress HEENT: Moist mucosa, supple neck Chest: Clear bilaterally neck sign CVS: Normal S1 and S2, no murmurs GI: Abdominal distention with ascites more unchanged from yesterday.  Nontender, lax abdominal wall Musculoskeletal: Warm, trace edema.      The results of significant diagnostics from this hospitalization (including imaging, microbiology, ancillary and laboratory) are listed below for reference.     Microbiology: No results found for this or any previous visit (from the past 240 hour(s)).   Labs: BNP (last 3 results) No results for input(s): BNP in the last 8760 hours. Basic Metabolic Panel: Recent Labs  Lab 07/15/18 1252 07/19/18 0418  NA 126* 129*  K 4.4 3.5  CL 95* 96*  CO2 24 26  GLUCOSE 146* 118*  BUN 12 13  CREATININE 0.86 1.03  CALCIUM 8.2* 7.7*   Liver Function Tests: Recent Labs  Lab 07/15/18 1252  AST 58*  ALT 34  ALKPHOS 115  BILITOT 4.7*  PROT 7.5  ALBUMIN 2.4*   No results for input(s): LIPASE, AMYLASE in the last 168 hours. Recent Labs  Lab 07/15/18 1243  AMMONIA 61*   CBC: Recent Labs  Lab 07/15/18 1252  WBC 7.1  HGB 8.5*  HCT 24.5*  MCV 100.4*  PLT 121*   Cardiac Enzymes: No results for input(s): CKTOTAL, CKMB, CKMBINDEX, TROPONINI in the last 168 hours. BNP: Invalid input(s): POCBNP CBG: Recent Labs  Lab 07/18/18 1155  GLUCAP 136*   D-Dimer No results for input(s): DDIMER in the last 72 hours. Hgb A1c No results for  input(s): HGBA1C in the last 72 hours. Lipid Profile No results for input(s): CHOL, HDL, LDLCALC, TRIG, CHOLHDL, LDLDIRECT in the last 72 hours. Thyroid function studies No results for input(s): TSH, T4TOTAL, T3FREE, THYROIDAB in the last 72 hours.  Invalid input(s): FREET3 Anemia work up No results for input(s): VITAMINB12, FOLATE, FERRITIN, TIBC, IRON, RETICCTPCT in the last 72 hours. Urinalysis    Component Value Date/Time   COLORURINE YELLOW 06/22/2018 0131   APPEARANCEUR CLEAR 06/22/2018 0131   LABSPEC 1.005 06/22/2018 0131   PHURINE 5.0 06/22/2018 0131   GLUCOSEU NEGATIVE 06/22/2018 0131   HGBUR MODERATE (A) 06/22/2018 0131   BILIRUBINUR NEGATIVE 06/22/2018 0131   KETONESUR NEGATIVE 06/22/2018 0131   PROTEINUR NEGATIVE 06/22/2018 0131   NITRITE NEGATIVE 06/22/2018 0131   LEUKOCYTESUR NEGATIVE 06/22/2018 0131  Sepsis Labs Invalid input(s): PROCALCITONIN,  WBC,  LACTICIDVEN Microbiology No results found for this or any previous visit (from the past 240 hour(s)).   Time coordinating discharge: <30 minutes  SIGNED:   Eddie North, MD  [facility] 07/20/2018, 1:05 PM Pager   If 7PM-7AM, please contact night-coverage [email or web address] Password [email or web address]

## 2018-07-20 NOTE — Progress Notes (Signed)
Reviewed US Abdomen from yesterday with Dr. Lowella DandyHenn.  Only a small amount of lower quadrant ascites has reaccumulated.  On review of patient history, he typically reaccumulates slowly as last paracentesis was 2 weeks prior to most recent performed 07/16/18.  Discussed possibility of reassessing Monday with US vs. Discharge to hospice with plans to return for PleurX placement as an outpatient once abdomen distended. He will discuss with case management.   IR following.   Loyce DysKacie Skylin Kennerson, MS RD PA-C 10:09 AM

## 2018-07-20 NOTE — Care Management Note (Signed)
Case Management Note  Patient Details  Name: Derrick RossBarry K Oneill MRN: [id number] Date of Birth: Jan 01, 1951  Subjective/Objective:    Cirrhosis, Ascites.             Action/Plan: Patient will DC home to follow up with IR for PleurX placement. Patient reports no issues with transportation [place]. [facility] aware and  will keep in touch with patient to enroll him after PleurX is placed. Patient also has phone number to reach Hospice. Patient has a friend who is living with him to assist him as needed at home also.   Expected Discharge Date:    07/20/2018              Expected Discharge Plan:     In-House Referral:     Discharge planning Services   CM consult  Post Acute Care Choice:    Choice offered to:   patient  DME Arranged:    DME Agency:     HH Arranged:    HH Agency:   [facility]  Status of Service:   Completed.   If discussed at Long Length of Stay Meetings, dates discussed:    Additional Comments:  Brenley Priore, Chrystine OilerSharley Diane, RN 07/20/2018, 1:19 PM

## 2018-07-20 NOTE — Progress Notes (Signed)
Patient discharged home with personal belongings. IV removed and site intact. Patient discharged with AVS instructions and verbalizes understanding [facility].

## 2018-07-23 ENCOUNTER — Other Ambulatory Visit (HOSPITAL_COMMUNITY): Payer: Self-pay | Admitting: Interventional Radiology

## 2018-07-23 DIAGNOSIS — R188 Other ascites: Secondary | ICD-10-CM

## 2018-07-27 ENCOUNTER — Other Ambulatory Visit: Payer: Self-pay | Admitting: Radiology

## 2018-07-30 ENCOUNTER — Other Ambulatory Visit: Payer: Self-pay | Admitting: Student

## 2018-07-31 ENCOUNTER — Other Ambulatory Visit (HOSPITAL_COMMUNITY): Payer: Self-pay | Admitting: Interventional Radiology

## 2018-07-31 ENCOUNTER — Encounter (HOSPITAL_COMMUNITY): Payer: Self-pay

## 2018-07-31 ENCOUNTER — Ambulatory Visit (HOSPITAL_COMMUNITY)
Admission: RE | Admit: 2018-07-31 | Discharge: 2018-07-31 | Payer: Medicare Other | Source: Ambulatory Visit | Attending: Interventional Radiology | Admitting: Interventional Radiology

## 2018-07-31 DIAGNOSIS — K7031 Alcoholic cirrhosis of liver with ascites: Secondary | ICD-10-CM | POA: Diagnosis not present

## 2018-07-31 DIAGNOSIS — I10 Essential (primary) hypertension: Secondary | ICD-10-CM | POA: Diagnosis not present

## 2018-07-31 DIAGNOSIS — I471 Supraventricular tachycardia: Secondary | ICD-10-CM | POA: Diagnosis not present

## 2018-07-31 DIAGNOSIS — R188 Other ascites: Secondary | ICD-10-CM

## 2018-07-31 HISTORY — PX: IR PARACENTESIS: IMG2679

## 2018-07-31 LAB — BASIC METABOLIC PANEL
Anion gap: 7 (ref 5–15)
BUN: 15 mg/dL (ref 8–23)
CO2: 28 mmol/L (ref 22–32)
Calcium: 8.6 mg/dL — ABNORMAL LOW (ref 8.9–10.3)
Chloride: 100 mmol/L (ref 98–111)
Creatinine, Ser: 1.04 mg/dL (ref 0.61–1.24)
GFR calc Af Amer: >60 mL/min (ref >60)
GLUCOSE: 86 mg/dL (ref 70–99)
Potassium: 4.1 mmol/L (ref 3.5–5.1)
Sodium: 135 mmol/L (ref 135–145)

## 2018-07-31 LAB — CBC
HCT: 28.5 % — ABNORMAL LOW (ref 39.0–52.0)
Hemoglobin: 9.5 g/dL — ABNORMAL LOW (ref 13.0–17.0)
MCH: 35.1 pg — ABNORMAL HIGH (ref 26.0–34.0)
MCHC: 33.3 g/dL (ref 30.0–36.0)
MCV: 105.2 fL — ABNORMAL HIGH (ref 78.0–100.0)
Platelets: 120 10*3/uL — ABNORMAL LOW (ref 150–400)
RBC: 2.71 MIL/uL — ABNORMAL LOW (ref 4.22–5.81)
RDW: 17.1 % — ABNORMAL HIGH (ref 11.5–15.5)
WBC: 5.3 10*3/uL (ref 4.0–10.5)

## 2018-07-31 LAB — PROTIME-INR
INR: 1.46
Prothrombin Time: 17.6 seconds — ABNORMAL HIGH (ref 11.4–15.2)

## 2018-07-31 MED ORDER — CEFAZOLIN SODIUM-DEXTROSE 2-4 GM/100ML-% IV SOLN: 2.0000 g | INTRAVENOUS | Status: DC

## 2018-07-31 MED ORDER — LIDOCAINE HCL (PF) 2 % IJ SOLN
INTRAMUSCULAR | Status: DC | PRN
  Administered 2018-07-31: 10 mL

## 2018-07-31 MED ORDER — LIDOCAINE HCL (PF) 2 % IJ SOLN
INTRAMUSCULAR | Status: AC
  Filled 2018-07-31: qty 20

## 2018-07-31 MED ORDER — SODIUM CHLORIDE 0.9 % IV SOLN: Freq: Once | INTRAVENOUS | Status: DC

## 2018-07-31 NOTE — Discharge Instructions (Signed)
Moderate Conscious Sedation, Adult, Care After  These instructions provide you with information about caring for yourself after your procedure. Your health care provider may also give you more specific instructions. Your treatment has been planned according to current medical practices, but problems sometimes occur. Call your health care provider if you have any problems or questions after your procedure.  What can I expect after the procedure?  After your procedure, it is common:   To feel sleepy for several hours.   To feel clumsy and have poor balance for several hours.   To have poor judgment for several hours.   To vomit if you eat too soon.    Follow these instructions at home:  For at least 24 hours after the procedure:     Do not:  ? Participate in activities where you could fall or become injured.  ? Drive.  ? Use heavy machinery.  ? Drink alcohol.  ? Take sleeping pills or medicines that cause drowsiness.  ? Make important decisions or sign legal documents.  ? Take care of children on your own.   Rest.  Eating and drinking   Follow the diet recommended by your health care provider.   If you vomit:  ? Drink water, juice, or soup when you can drink without vomiting.  ? Make sure you have little or no nausea before eating solid foods.  General instructions   Have a responsible adult stay with you until you are awake and alert.   Take over-the-counter and prescription medicines only as told by your health care provider.   If you smoke, do not smoke without supervision.   Keep all follow-up visits as told by your health care provider. This is important.  Contact a health care provider if:   You keep feeling nauseous or you keep vomiting.   You feel light-headed.   You develop a rash.   You have a fever.  Get help right away if:   You have trouble breathing.  This information is not intended to replace advice given to you by your health care provider. Make sure you discuss any questions you have  with your health care provider.  Document Released: 09/11/2013 Document Revised: 04/25/2016 Document Reviewed: 03/12/2016  Elsevier Interactive Patient Education  2018 [employer]vier Inc[employer]

## 2018-07-31 NOTE — Procedures (Signed)
   RLQ paracentesis  2.8 L yellow fluid  Tolerated well

## 2018-07-31 NOTE — Progress Notes (Signed)
Patient ID: Derrick RossBarry K Oneill, male   DOB: 21-Aug-1951, 67 y.o.   MRN: [id number]   Pt was scheduled today for Peritoneal PleurX catheter placement  Known recurrent ascites Etoh Cirrhosis HTN; SVT requiring amiodarone Placed on Home Hospice last inpt admission [facility]-- discharged to home with Hospice 07/20/2018  Tunneled peritoneal PleurX was scheduled for today BUT [place] [facility] [facility]  Presented today with [employer] escort  Cannot place tunneled catheter in pt going back to correctional facility per Dr Miles CostainShick Unsafe for pt; high risk for infection Must be returning to home Hospice or Hospice facility to safely place this catheter And have staff identified to manage same  Paracentesis was performed today 2.8 L yellow fluid Tolerated well  Will reschedule when pt is at home with Hospice nursing managing his care.  He understands and is agreeable

## 2018-08-11 ENCOUNTER — Encounter (HOSPITAL_COMMUNITY): Payer: Self-pay | Admitting: Emergency Medicine

## 2018-08-11 ENCOUNTER — Other Ambulatory Visit: Payer: Self-pay

## 2018-08-11 ENCOUNTER — Emergency Department (HOSPITAL_COMMUNITY): Payer: Medicare Other

## 2018-08-11 ENCOUNTER — Inpatient Hospital Stay (HOSPITAL_COMMUNITY)
Admission: EM | Admit: 2018-08-11 | Discharge: 2018-08-20 | DRG: 987 | Disposition: A | Discharge status: Home / Self Care | Payer: Medicare Other | Attending: Internal Medicine | Admitting: Internal Medicine

## 2018-08-11 DIAGNOSIS — Z66 Do not resuscitate: Secondary | ICD-10-CM | POA: Diagnosis present

## 2018-08-11 DIAGNOSIS — G312 Degeneration of nervous system due to alcohol: Secondary | ICD-10-CM | POA: Diagnosis not present

## 2018-08-11 DIAGNOSIS — K7031 Alcoholic cirrhosis of liver with ascites: Principal | ICD-10-CM | POA: Diagnosis present

## 2018-08-11 DIAGNOSIS — L03116 Cellulitis of left lower limb: Secondary | ICD-10-CM | POA: Diagnosis not present

## 2018-08-11 DIAGNOSIS — I10 Essential (primary) hypertension: Secondary | ICD-10-CM | POA: Diagnosis present

## 2018-08-11 DIAGNOSIS — Z6827 Body mass index (BMI) 27.0-27.9, adult: Secondary | ICD-10-CM

## 2018-08-11 DIAGNOSIS — Z515 Encounter for palliative care: Secondary | ICD-10-CM | POA: Diagnosis present

## 2018-08-11 DIAGNOSIS — K7290 Hepatic failure, unspecified without coma: Secondary | ICD-10-CM | POA: Diagnosis not present

## 2018-08-11 DIAGNOSIS — F1020 Alcohol dependence, uncomplicated: Secondary | ICD-10-CM | POA: Diagnosis not present

## 2018-08-11 DIAGNOSIS — K7682 Hepatic encephalopathy: Secondary | ICD-10-CM

## 2018-08-11 DIAGNOSIS — L03115 Cellulitis of right lower limb: Secondary | ICD-10-CM | POA: Diagnosis not present

## 2018-08-11 DIAGNOSIS — R627 Adult failure to thrive: Secondary | ICD-10-CM | POA: Diagnosis present

## 2018-08-11 DIAGNOSIS — I959 Hypotension, unspecified: Secondary | ICD-10-CM | POA: Diagnosis present

## 2018-08-11 DIAGNOSIS — D539 Nutritional anemia, unspecified: Secondary | ICD-10-CM | POA: Diagnosis present

## 2018-08-11 DIAGNOSIS — Z811 Family history of alcohol abuse and dependence: Secondary | ICD-10-CM

## 2018-08-11 DIAGNOSIS — R601 Generalized edema: Secondary | ICD-10-CM | POA: Diagnosis not present

## 2018-08-11 DIAGNOSIS — F1010 Alcohol abuse, uncomplicated: Secondary | ICD-10-CM | POA: Diagnosis present

## 2018-08-11 DIAGNOSIS — E871 Hypo-osmolality and hyponatremia: Secondary | ICD-10-CM | POA: Diagnosis not present

## 2018-08-11 DIAGNOSIS — Z79899 Other long term (current) drug therapy: Secondary | ICD-10-CM | POA: Diagnosis not present

## 2018-08-11 DIAGNOSIS — L03119 Cellulitis of unspecified part of limb: Secondary | ICD-10-CM | POA: Diagnosis not present

## 2018-08-11 DIAGNOSIS — E8770 Fluid overload, unspecified: Secondary | ICD-10-CM | POA: Diagnosis not present

## 2018-08-11 DIAGNOSIS — J810 Acute pulmonary edema: Secondary | ICD-10-CM | POA: Diagnosis present

## 2018-08-11 DIAGNOSIS — L8990 Pressure ulcer of unspecified site, unspecified stage: Secondary | ICD-10-CM | POA: Diagnosis present

## 2018-08-11 DIAGNOSIS — L02415 Cutaneous abscess of right lower limb: Secondary | ICD-10-CM | POA: Diagnosis present

## 2018-08-11 DIAGNOSIS — L0390 Cellulitis, unspecified: Secondary | ICD-10-CM

## 2018-08-11 DIAGNOSIS — K659 Peritonitis, unspecified: Secondary | ICD-10-CM | POA: Diagnosis present

## 2018-08-11 DIAGNOSIS — F17220 Nicotine dependence, chewing tobacco, uncomplicated: Secondary | ICD-10-CM | POA: Diagnosis not present

## 2018-08-11 DIAGNOSIS — J811 Chronic pulmonary edema: Secondary | ICD-10-CM | POA: Diagnosis not present

## 2018-08-11 DIAGNOSIS — B9562 Methicillin resistant Staphylococcus aureus infection as the cause of diseases classified elsewhere: Secondary | ICD-10-CM | POA: Diagnosis not present

## 2018-08-11 LAB — CBC WITH DIFFERENTIAL/PLATELET
Basophils Absolute: 0 10*3/uL (ref 0.0–0.1)
Basophils Relative: 0 %
Eosinophils Absolute: 0 10*3/uL (ref 0.0–0.7)
Eosinophils Relative: 0 %
HEMATOCRIT: 26.8 % — AB (ref 39.0–52.0)
Hemoglobin: 9.3 g/dL — ABNORMAL LOW (ref 13.0–17.0)
LYMPHS ABS: 0.4 10*3/uL — AB (ref 0.7–4.0)
LYMPHS PCT: 5 %
MCH: 35.4 pg — AB (ref 26.0–34.0)
MCHC: 34.7 g/dL (ref 30.0–36.0)
MCV: 101.9 fL — AB (ref 78.0–100.0)
MONO ABS: 1.9 10*3/uL — AB (ref 0.1–1.0)
Monocytes Relative: 22 %
NEUTROS ABS: 6.2 10*3/uL (ref 1.7–7.7)
Neutrophils Relative %: 73 %
PLATELETS: 150 10*3/uL (ref 150–400)
RBC: 2.63 MIL/uL — AB (ref 4.22–5.81)
RDW: 15.4 % (ref 11.5–15.5)
WBC: 8.6 10*3/uL (ref 4.0–10.5)

## 2018-08-11 LAB — COMPREHENSIVE METABOLIC PANEL
ALBUMIN: 2.2 g/dL — AB (ref 3.5–5.0)
ALK PHOS: 153 U/L — AB (ref 38–126)
ALT: 27 U/L (ref 0–44)
AST: 48 U/L — ABNORMAL HIGH (ref 15–41)
Anion gap: 2 — ABNORMAL LOW (ref 5–15)
BUN: 10 mg/dL (ref 8–23)
CALCIUM: 7.4 mg/dL — AB (ref 8.9–10.3)
CO2: 30 mmol/L (ref 22–32)
CREATININE: 0.94 mg/dL (ref 0.61–1.24)
Chloride: 96 mmol/L — ABNORMAL LOW (ref 98–111)
GFR calc Af Amer: >60 mL/min (ref >60)
GFR calc non Af Amer: >60 mL/min (ref >60)
GLUCOSE: 150 mg/dL — AB (ref 70–99)
Potassium: 4.1 mmol/L (ref 3.5–5.1)
SODIUM: 128 mmol/L — AB (ref 135–145)
Total Bilirubin: 2.6 mg/dL — ABNORMAL HIGH (ref 0.3–1.2)
Total Protein: 7.4 g/dL (ref 6.5–8.1)

## 2018-08-11 LAB — BODY FLUID CELL COUNT WITH DIFFERENTIAL
EOS FL: 0 %
Lymphs, Fluid: 44 %
Monocyte-Macrophage-Serous Fluid: 39 % — ABNORMAL LOW (ref 50–90)
NEUTROPHIL FLUID: 17 % (ref 0–25)
WBC FLUID: 168 uL (ref 0–1000)

## 2018-08-11 LAB — LACTIC ACID, PLASMA: LACTIC ACID, VENOUS: 2.3 mmol/L — AB (ref 0.5–1.9)

## 2018-08-11 LAB — GRAM STAIN

## 2018-08-11 LAB — PROTIME-INR
INR: 1.49
Prothrombin Time: 17.9 seconds — ABNORMAL HIGH (ref 11.4–15.2)

## 2018-08-11 LAB — AMMONIA: AMMONIA: 49 umol/L — AB (ref 9–35)

## 2018-08-11 MED ORDER — ADULT MULTIVITAMIN W/MINERALS CH
1.0000 | ORAL_TABLET | Freq: Every day | ORAL | Status: DC
  Administered 2018-08-12 – 2018-08-19 (×7): 1 via ORAL
  Filled 2018-08-11 (×7): qty 1

## 2018-08-11 MED ORDER — PANTOPRAZOLE SODIUM 40 MG PO TBEC
40.0000 mg | DELAYED_RELEASE_TABLET | Freq: Every day | ORAL | Status: DC
  Administered 2018-08-12 – 2018-08-20 (×8): 40 mg via ORAL
  Filled 2018-08-11 (×8): qty 1

## 2018-08-11 MED ORDER — LACTULOSE 10 GM/15ML PO SOLN
20.0000 g | Freq: Three times a day (TID) | ORAL | Status: DC
  Administered 2018-08-12 – 2018-08-20 (×24): 20 g via ORAL
  Filled 2018-08-11 (×25): qty 30

## 2018-08-11 MED ORDER — FUROSEMIDE 10 MG/ML IJ SOLN
40.0000 mg | Freq: Two times a day (BID) | INTRAMUSCULAR | Status: DC
  Administered 2018-08-11 – 2018-08-12 (×2): 40 mg via INTRAVENOUS
  Filled 2018-08-11 (×3): qty 4

## 2018-08-11 MED ORDER — SODIUM CHLORIDE 0.9 % IV SOLN: 250.0000 mL | INTRAVENOUS | Status: DC | PRN

## 2018-08-11 MED ORDER — VITAMIN D 1000 UNITS PO TABS
1000.0000 [IU] | ORAL_TABLET | Freq: Every day | ORAL | Status: DC
  Administered 2018-08-12 – 2018-08-20 (×8): 1000 [IU] via ORAL
  Filled 2018-08-11 (×9): qty 1

## 2018-08-11 MED ORDER — SPIRONOLACTONE 100 MG PO TABS
100.0000 mg | ORAL_TABLET | Freq: Every day | ORAL | Status: DC
  Administered 2018-08-12: 100 mg via ORAL
  Filled 2018-08-11: qty 1

## 2018-08-11 MED ORDER — ACETAMINOPHEN 650 MG RE SUPP: 650.0000 mg | Freq: Four times a day (QID) | RECTAL | Status: DC | PRN

## 2018-08-11 MED ORDER — ONDANSETRON HCL 4 MG/2ML IJ SOLN: 4.0000 mg | Freq: Four times a day (QID) | INTRAMUSCULAR | Status: DC | PRN

## 2018-08-11 MED ORDER — ENOXAPARIN SODIUM 40 MG/0.4ML ~~LOC~~ SOLN
40.0000 mg | SUBCUTANEOUS | Status: DC
  Administered 2018-08-12: 40 mg via SUBCUTANEOUS
  Filled 2018-08-11: qty 0.4

## 2018-08-11 MED ORDER — DOCUSATE SODIUM 100 MG PO CAPS
200.0000 mg | ORAL_CAPSULE | Freq: Two times a day (BID) | ORAL | Status: DC
  Administered 2018-08-12 – 2018-08-20 (×17): 200 mg via ORAL
  Filled 2018-08-11 (×17): qty 2

## 2018-08-11 MED ORDER — NADOLOL 20 MG PO TABS
20.0000 mg | ORAL_TABLET | Freq: Every day | ORAL | Status: DC
  Administered 2018-08-12 – 2018-08-20 (×8): 20 mg via ORAL
  Filled 2018-08-11 (×9): qty 1

## 2018-08-11 MED ORDER — ONDANSETRON HCL 4 MG PO TABS: 4.0000 mg | ORAL_TABLET | Freq: Four times a day (QID) | ORAL | Status: DC | PRN

## 2018-08-11 MED ORDER — VITAMIN B-1 100 MG PO TABS
100.0000 mg | ORAL_TABLET | Freq: Every day | ORAL | Status: DC
  Administered 2018-08-12 – 2018-08-20 (×8): 100 mg via ORAL
  Filled 2018-08-11 (×8): qty 1

## 2018-08-11 MED ORDER — HYDROCODONE-ACETAMINOPHEN 5-325 MG PO TABS
1.0000 | ORAL_TABLET | ORAL | Status: DC | PRN
  Administered 2018-08-12 – 2018-08-14 (×7): 2 via ORAL
  Administered 2018-08-15: 1 via ORAL
  Administered 2018-08-18 – 2018-08-19 (×3): 2 via ORAL
  Filled 2018-08-11 (×3): qty 2
  Filled 2018-08-11 (×2): qty 1
  Filled 2018-08-11 (×5): qty 2
  Filled 2018-08-11: qty 1
  Filled 2018-08-11: qty 2

## 2018-08-11 MED ORDER — FOLIC ACID 1 MG PO TABS
1.0000 mg | ORAL_TABLET | Freq: Every day | ORAL | Status: DC
  Administered 2018-08-12 – 2018-08-20 (×8): 1 mg via ORAL
  Filled 2018-08-11 (×8): qty 1

## 2018-08-11 MED ORDER — SODIUM CHLORIDE 0.9% FLUSH: 3.0000 mL | INTRAVENOUS | Status: DC | PRN

## 2018-08-11 MED ORDER — SODIUM CHLORIDE 0.9 % IV SOLN
1.0000 g | INTRAVENOUS | Status: DC
  Administered 2018-08-11 – 2018-08-13 (×3): 1 g via INTRAVENOUS
  Filled 2018-08-11 (×3): qty 10

## 2018-08-11 MED ORDER — SODIUM CHLORIDE 0.9% FLUSH
3.0000 mL | Freq: Two times a day (BID) | INTRAVENOUS | Status: DC
  Administered 2018-08-12 – 2018-08-20 (×15): 3 mL via INTRAVENOUS

## 2018-08-11 MED ORDER — ACETAMINOPHEN 325 MG PO TABS: 650.0000 mg | ORAL_TABLET | Freq: Four times a day (QID) | ORAL | Status: DC | PRN

## 2018-08-11 NOTE — ED Notes (Signed)
Pt has abrasion on right hip. States he had to jump off of lawnmower. This RN leaving uncovered until he gets to the floor

## 2018-08-11 NOTE — ED Triage Notes (Addendum)
Patient c/o ascites in abd from cirrhosis. Per patient had fluid drawn off (2 Liters) at Hospice last week. Patient reports shortness of breath. Patient reports drinking some beer two days ago.

## 2018-08-11 NOTE — ED Notes (Signed)
RN assisted to get client cleaned up after refusing to be changed into dry clothes.

## 2018-08-11 NOTE — ED Notes (Signed)
Date and time results received: 08/11/18 20:05  Test: lactic acid 2.3 Critical Value: lactic acid 2.3  Name of Provider Notified: Dr Rubin Payor  Orders Received? Or Actions Taken?: no additional orders given,

## 2018-08-11 NOTE — ED Notes (Signed)
Pt used urinal and urinated on himself as well as in the urinal. This tech attempted to clean him and give him dry clothes. Pt was adamant about not cleaning him and he would wait until he gets a room. He told this tech to "just leave me. I will be okay."
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
## 2018-08-11 NOTE — H&P (Signed)
History and Physical    PAITON FOSCO GMW:[id number] DOB: 1951-04-10 DOA: 08/11/2018  PCP: [facility]   Patient coming from: Home   Chief Complaint: Abdominal distension, SOB, leg redness, chills    HPI: Derrick Oneill is a 67 y.o. male with medical history significant for alcoholic cirrhosis with resistant ascites, now presenting to the emergency department for evaluation of abdominal distention, shortness of breath, lower extremity erythema, and chills.  Patient was hospitalized last month after a similar presentation, underwent a therapeutic paracentesis, and there were plans for placement of a peritoneal catheter by IR after discharge, but patient became incarcerated and this was not performed.  He has since returned home, has had recurrent abdominal distention with discomfort and increasing shortness of breath.  He also reports increasing erythema and tenderness to the bilateral lower extremities and reports chills at home.  Denies chest pain, palpitations, or cough.  ED Course: Upon arrival to the ED, patient is found to be afebrile, saturating 90% on 2 L/min of supplemental oxygen, slightly tachypneic, slightly tachycardic, and with stable blood pressure.  Chest x-ray is notable for interstitial edema.  Chemistry panel features a chronic hyponatremia and hyperbilirubinemia.  CBC is notable for stable chronic microcytic anemia and INR is elevated to 1.49.  Patient underwent paracentesis in the ED with 1 to 1.5 L taken off and sent for fluid analysis which does not suggest infection.  He continues to require supplemental oxygen to maintain saturations in the low 90s while at rest and will be admitted for further evaluation and management of resistant ascites, pulmonary edema with new O2 requirement, and bilateral lower extremity edema with serous weeping and secondary cellulitis.  Review of Systems:  All other systems reviewed and apart from HPI, are negative.  Past Medical  History:  Diagnosis Date  . Cirrhosis (HCC)   . Hypertension   . Psoriasis   . Renal insufficiency 05/29/2018    Past Surgical History:  Procedure Laterality Date  . HERNIA REPAIR    . IR PARACENTESIS  07/31/2018  . PARACENTESIS    . UMBILICAL HERNIA REPAIR       reports that he has been smoking cigars. He has quit using smokeless tobacco.  His smokeless tobacco use included snuff and chew. He reports that he drinks alcohol. He reports that he does not use drugs.  No Known Allergies  Family History  Problem Relation Age of Onset  . Cirrhosis Father        deceased age 19, etoh related     Prior to Admission medications   Medication Sig Start Date End Date Taking? Authorizing Provider  cholecalciferol (VITAMIN D) 1000 units tablet Take 1,000 Units by mouth daily.   Yes [provider]  clobetasol cream (TEMOVATE) 0.05 % Apply 1 application topically 2 (two) times daily as needed (Apply a thin layer as needed for psoriasis).   Yes [provider]  docusate sodium (COLACE) 100 MG capsule Take 200 mg by mouth 2 (two) times daily.    Yes [provider]  ferrous sulfate 325 (65 FE) MG tablet Take 325 mg by mouth daily with breakfast.   Yes [provider]  folic acid (FOLVITE) 1 MG tablet Take 1 mg by mouth daily.   Yes [provider]  lactulose (CHRONULAC) 10 GM/15ML solution Take 20 g by mouth 3 (three) times daily.    Yes [provider]  Multiple Vitamin (MULTIVITAMIN WITH MINERALS) TABS tablet Take 1 tablet by  mouth daily. 06/02/18  Yes Johnson, Clanford L, MD  nadolol (CORGARD) 20 MG tablet Take 1 tablet (20 mg total) by mouth daily. 06/28/18  Yes Vassie Loll, MD  potassium chloride SA (K-DUR,KLOR-CON) 20 MEQ tablet Take 20 mEq by mouth 2 (two) times daily.   Yes [provider]  spironolactone (ALDACTONE) 100 MG tablet Take 1 tablet (100 mg total) by mouth daily. 06/28/18  Yes Vassie Loll, MD  thiamine 100 MG  tablet Take 100 mg by mouth daily.   Yes [provider]  torsemide (DEMADEX) 20 MG tablet Take 1 tablet (20 mg total) by mouth daily. 06/27/18  Yes Vassie Loll, MD  Vitamin D, Ergocalciferol, (DRISDOL) 50000 units CAPS capsule Take 50,000 Units by mouth every 7 (seven) days.   Yes [provider]    Physical Exam: Vitals:   08/11/18 2045 08/11/18 2100 08/11/18 2130 08/11/18 2200  BP:  124/67 110/77 (!) 155/88  Pulse: 98 98 90 (!) 102  Resp:  18 20 20   Temp:      SpO2: 90% 93% 92% 94%  Weight:      Height:          Constitutional: NAD, appears uncomfortable  Eyes: PERTLA, lids and conjunctivae normal ENMT: Mucous membranes are moist. Posterior pharynx clear of any exudate or lesions.   Neck: normal, supple, no masses, no thyromegaly Respiratory: fine rales bilaterally, no wheezing. Normal respiratory effort.    Cardiovascular: S1 & S2 heard, regular rate and rhythm. 3+ pitting edema to LEs bilaterally. Abdomen: Marked distension, no rebound pain or guarding. Bowel sounds active.  Musculoskeletal: no clubbing / cyanosis. No joint deformity upper and lower extremities.    Skin: Distal LE's intensely erythematous and hot with pitting edema and serous weeping. Otherwise warm, dry, well-perfused. Neurologic: No facial asymmetry. Sensation intact. Moving all extremities.  Psychiatric: Alert and oriented to person, place, and situation. Calm, cooperative.     Labs on Admission: I have personally reviewed following labs and imaging studies  CBC: Recent Labs  Lab 08/11/18 1855  WBC 8.6  NEUTROABS 6.2  HGB 9.3*  HCT 26.8*  MCV 101.9*  PLT 150   Basic Metabolic Panel: Recent Labs  Lab 08/11/18 1855  NA 128*  K 4.1  CL 96*  CO2 30  GLUCOSE 150*  BUN 10  CREATININE 0.94  CALCIUM 7.4*   GFR: Estimated Creatinine Clearance: 86.7 mL/min (by C-G formula based on SCr of 0.94 mg/dL). Liver Function Tests: Recent Labs  Lab 08/11/18 1855  AST 48*  ALT  27  ALKPHOS 153*  BILITOT 2.6*  PROT 7.4  ALBUMIN 2.2*   No results for input(s): LIPASE, AMYLASE in the last 168 hours. No results for input(s): AMMONIA in the last 168 hours. Coagulation Profile: Recent Labs  Lab 08/11/18 1855  INR 1.49   Cardiac Enzymes: No results for input(s): CKTOTAL, CKMB, CKMBINDEX, TROPONINI in the last 168 hours. BNP (last 3 results) No results for input(s): PROBNP in the last 8760 hours. HbA1C: No results for input(s): HGBA1C in the last 72 hours. CBG: No results for input(s): GLUCAP in the last 168 hours. Lipid Profile: No results for input(s): CHOL, HDL, LDLCALC, TRIG, CHOLHDL, LDLDIRECT in the last 72 hours. Thyroid Function Tests: No results for input(s): TSH, T4TOTAL, FREET4, T3FREE, THYROIDAB in the last 72 hours. Anemia Panel: No results for input(s): VITAMINB12, FOLATE, FERRITIN, TIBC, IRON, RETICCTPCT in the last 72 hours. Urine analysis:    Component Value Date/Time   COLORURINE YELLOW 06/22/2018  0131   APPEARANCEUR CLEAR 06/22/2018 0131   LABSPEC 1.005 06/22/2018 0131   PHURINE 5.0 06/22/2018 0131   GLUCOSEU NEGATIVE 06/22/2018 0131   HGBUR MODERATE (A) 06/22/2018 0131   BILIRUBINUR NEGATIVE 06/22/2018 0131   KETONESUR NEGATIVE 06/22/2018 0131   PROTEINUR NEGATIVE 06/22/2018 0131   NITRITE NEGATIVE 06/22/2018 0131   LEUKOCYTESUR NEGATIVE 06/22/2018 0131   Sepsis Labs: @LABRCNTIP (procalcitonin:4,lacticidven:4) ) Recent Results (from the past 240 hour(s))  Gram stain     Status: None   Collection Time: 08/11/18  8:21 PM  Result Value Ref Range Status   Specimen Description PERITONEAL  Final   Special Requests NONE  Final   Gram Stain   Final    CYTOSPIN SMEAR NO ORGANISMS SEEN WBC PRESENT, PREDOMINANTLY MONONUCLEAR Gram Stain Report Called to,Read Back By and Verified With: TURNER,C. AT 2111 ON 08/11/2018 BY EVA Performed at [facility], [address], [place], [place] [address]    Report Status 08/11/2018 FINAL  Final    Culture, body fluid-bottle     Status: None (Preliminary result)   Collection Time: 08/11/18  8:27 PM  Result Value Ref Range Status   Specimen Description PERITONEAL  Final   Special Requests   Final    BOTTLES DRAWN AEROBIC AND ANAEROBIC Blood Culture adequate volume Performed at [facility], [address] [place], [place] [address]    Culture PENDING  Incomplete   Report Status PENDING  Incomplete     Radiological Exams on Admission: Dg Chest 2 View  Result Date: 08/11/2018 CLINICAL DATA:  Shortness of breath. Abdominal ascites from cirrhosis. EXAM: CHEST - 2 VIEW COMPARISON:  06/20/2018 FINDINGS: Low lung volumes with exaggeration of the mediastinal contours. Apparent soft tissue thickening in the right suprahilar region. Tortuosity and calcific atherosclerotic disease of the aorta. Interstitial pulmonary edema. No definite effusion. Bibasilar atelectasis. Osseous structures are without acute abnormality. Soft tissues are grossly normal. IMPRESSION: Low lung volumes with exaggeration of the mediastinal contours. Apparent soft tissue thickening in the right suprahilar region. Repeated chest radiograph in full inspiration may be considered. Interstitial pulmonary edema. Bibasilar atelectasis. Electronically Signed   By: Ted Mcalpine M.D.   On: 08/11/2018 19:56    EKG: Not performed.   Assessment/Plan  1. Alcoholic cirrhosis with resistant ascites  - Presents with abdominal distension, SOB, and bilateral leg redness  - He has required recurrent paracentesis despite diuretics and was reportedly going to have drain placed by IR but was incarcerated  - Just over a liter was removed in ED, fluid studies not suggestive of infection, and remains significantly distended  - Plan to diurese as discussed below, consult with IR for drain vs large-volume paracentesis  - Continue beta-blocker, diuretics, PPI, lactulose, b-vitamins, monitor lytes    2. Cellulitis  - Patient has marked  bilateral LE edema with serous weeping and appears to have a secondary cellulitis with intense erythema, heat, and tenderness  - He reports recent chills, is afebrile in ED  - Start empiric Rocephin, keep legs elevated, diurese    3. Hyponatremia - Serum sodium is 128 on admission  - Chronic and secondary to cirrhosis  - Follow daily chem panel during diuresis    4. Pulmonary edema  - Requiring 2 Lpm supplemental O2 in ED, CXR with interstitial edema  - Echo from July 2019 with normal EF and normal diastolic dysfunction, mild MR and no other valvular disease   - Likely secondary to cirrhosis with anasarca  - Diurese with Lasix 40 mg  IV q12h, continue Aldactone, continue supplemental O2 as needed    5. Macrocytic anemia   - Hgb appears stable  - No active bleeding, likely secondary to chronic disease  - Continue nutritional supplements     DVT prophylaxis: Lovenox Code Status: DNR  Family Communication: Discussed with patient  Consults called: None Admission status: Observation    Briscoe Deutscher, MD [facility] Pager [phone]  If 7PM-7AM, please contact night-coverage [email or web address] Password [email or web address]  08/11/2018, 10:56 PM

## 2018-08-12 DIAGNOSIS — B9562 Methicillin resistant Staphylococcus aureus infection as the cause of diseases classified elsewhere: Secondary | ICD-10-CM | POA: Diagnosis present

## 2018-08-12 DIAGNOSIS — J810 Acute pulmonary edema: Secondary | ICD-10-CM | POA: Diagnosis present

## 2018-08-12 DIAGNOSIS — Z66 Do not resuscitate: Secondary | ICD-10-CM | POA: Diagnosis present

## 2018-08-12 DIAGNOSIS — K7031 Alcoholic cirrhosis of liver with ascites: Secondary | ICD-10-CM | POA: Diagnosis present

## 2018-08-12 DIAGNOSIS — D539 Nutritional anemia, unspecified: Secondary | ICD-10-CM | POA: Diagnosis present

## 2018-08-12 DIAGNOSIS — R601 Generalized edema: Secondary | ICD-10-CM | POA: Diagnosis not present

## 2018-08-12 DIAGNOSIS — F1020 Alcohol dependence, uncomplicated: Secondary | ICD-10-CM | POA: Diagnosis present

## 2018-08-12 DIAGNOSIS — G312 Degeneration of nervous system due to alcohol: Secondary | ICD-10-CM | POA: Diagnosis present

## 2018-08-12 DIAGNOSIS — Z811 Family history of alcohol abuse and dependence: Secondary | ICD-10-CM | POA: Diagnosis not present

## 2018-08-12 DIAGNOSIS — L8990 Pressure ulcer of unspecified site, unspecified stage: Secondary | ICD-10-CM | POA: Diagnosis present

## 2018-08-12 DIAGNOSIS — E8770 Fluid overload, unspecified: Secondary | ICD-10-CM | POA: Diagnosis not present

## 2018-08-12 DIAGNOSIS — Z515 Encounter for palliative care: Secondary | ICD-10-CM | POA: Diagnosis present

## 2018-08-12 DIAGNOSIS — L03116 Cellulitis of left lower limb: Secondary | ICD-10-CM | POA: Diagnosis present

## 2018-08-12 DIAGNOSIS — F17220 Nicotine dependence, chewing tobacco, uncomplicated: Secondary | ICD-10-CM | POA: Diagnosis present

## 2018-08-12 DIAGNOSIS — K7290 Hepatic failure, unspecified without coma: Secondary | ICD-10-CM | POA: Diagnosis present

## 2018-08-12 DIAGNOSIS — R627 Adult failure to thrive: Secondary | ICD-10-CM | POA: Diagnosis present

## 2018-08-12 DIAGNOSIS — E871 Hypo-osmolality and hyponatremia: Secondary | ICD-10-CM | POA: Diagnosis present

## 2018-08-12 DIAGNOSIS — Z6827 Body mass index (BMI) 27.0-27.9, adult: Secondary | ICD-10-CM | POA: Diagnosis not present

## 2018-08-12 DIAGNOSIS — L02419 Cutaneous abscess of limb, unspecified: Secondary | ICD-10-CM | POA: Diagnosis not present

## 2018-08-12 DIAGNOSIS — L03119 Cellulitis of unspecified part of limb: Secondary | ICD-10-CM | POA: Diagnosis not present

## 2018-08-12 DIAGNOSIS — L02415 Cutaneous abscess of right lower limb: Secondary | ICD-10-CM | POA: Diagnosis present

## 2018-08-12 DIAGNOSIS — L03115 Cellulitis of right lower limb: Secondary | ICD-10-CM | POA: Diagnosis present

## 2018-08-12 DIAGNOSIS — J811 Chronic pulmonary edema: Secondary | ICD-10-CM | POA: Diagnosis present

## 2018-08-12 DIAGNOSIS — I10 Essential (primary) hypertension: Secondary | ICD-10-CM | POA: Diagnosis present

## 2018-08-12 DIAGNOSIS — K659 Peritonitis, unspecified: Secondary | ICD-10-CM | POA: Diagnosis present

## 2018-08-12 DIAGNOSIS — Z79899 Other long term (current) drug therapy: Secondary | ICD-10-CM | POA: Diagnosis not present

## 2018-08-12 DIAGNOSIS — I959 Hypotension, unspecified: Secondary | ICD-10-CM | POA: Diagnosis present

## 2018-08-12 MED ORDER — ENOXAPARIN SODIUM 40 MG/0.4ML ~~LOC~~ SOLN
40.0000 mg | SUBCUTANEOUS | Status: DC
  Administered 2018-08-14 – 2018-08-19 (×5): 40 mg via SUBCUTANEOUS
  Filled 2018-08-12 (×5): qty 0.4
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
## 2018-08-12 NOTE — Progress Notes (Signed)
PROGRESS NOTE    AYDYN TESTERMAN  ZOX:[id number] DOB: Apr 06, 1951 DOA: 08/11/2018 [facility]    Patient will be admitted to the inpatient service from observation due to requiring IV antibiotics and IV Lasix as well as current condition.  Brief Narrative: Derrick Oneill is a 67 y.o. male with medical history significant for alcoholic cirrhosis with resistant ascites, now presenting to the emergency department for evaluation of abdominal distention, shortness of breath, lower extremity erythema, and chills.  Patient was hospitalized last month after a similar presentation, underwent a therapeutic paracentesis, and there were plans for placement of a peritoneal catheter by IR after discharge, but patient became incarcerated and this was not performed.  He also planned to enroll in hospice care with [facility] but has not yewt been able to do so. He has since returned home, has had recurrent abdominal distention with discomfort and increasing shortness of breath.  He also reports increasing erythema and tenderness to the bilateral lower extremities and reports chills at home.  Denies chest pain, palpitations, or cough.  Patient is admitted for further management of ascites with Pleurx catheter drain placement and to arrange outpatient hospice  Assessment & Plan:   Principal Problem:   Cellulitis Active Problems:   Alcohol abuse   Alcoholic cirrhosis of liver with ascites (HCC)   Anasarca   Hyponatremia   Macrocytic anemia   Pulmonary edema   Pressure injury of skin   1. Alcoholic cirrhosis with resistant ascites  - Presents with abdominal distension, SOB, and bilateral leg redness  - He has required recurrent paracentesis despite diuretics and was reportedly going to have drain placed by IR but was incarcerated  - Just over a liter was removed in ED, fluid studies not suggestive of infection, and remains significantly distended  - Plan to diurese as discussed  below, consult with IR for drain vs large-volume paracentesis  - Continue beta-blocker, diuretics, PPI, lactulose, b-vitamins, monitor lytes -Fluid was cloudy raising concern for possible peritonitis   there is pending  1a.  Mild hypotension: Patient's blood pressure slightly low at about 11 AM.  Lasix was held however spironolactone and now below were given.  We will continue to monitor blood pressures.  2. Cellulitis  - Patient has marked bilateral LE edema with serous weeping and appears to have a secondary cellulitis with intense erythema, heat, and tenderness  - He reports recent chills, is afebrile in ED  - Continue empiric Rocephin, keep legs elevated, diurese    3. Hyponatremia - Serum sodium is 128 on admission; repeat in a.m. - Chronic and secondary to cirrhosis  - Follow daily chem panel during diuresis    4. Pulmonary edema  - Requiring 2 Lpm supplemental O2 in ED, CXR with interstitial edema  - Echo from July 2019 with normal EF and normal diastolic dysfunction, mild MR and no other valvular disease   - Likely secondary to cirrhosis with anasarca  - continue Aldactone, continue supplemental O2 as needed   (Lasix now on hold due to low blood pressure)  5. Macrocytic anemia   - Hgb appears stable  - No active bleeding, likely secondary to chronic disease  - Continue nutritional supplements    6.  End-of-life care: Patient will be referred to [facility] at discharge per his wishes.  Our goals this hospitalization are for comfort care and to treat any problems causing him pain or discomfort.  At discharge would recommend switching to  oral antibiotics.   DVT prophylaxis: Lovenox Code Status: DNR  Family Communication: Discussed with patient  Consults called: None Admission status: Observation    Consultants:   Interventional radiology for Pleurx drainage catheter requested upon admission and planned for Monday   Antimicrobials: IV ceftriaxone today  is day #2   Subjective: Patient very fatigued and ill-appearing.  Requesting Pleurx drainage catheter placement due to multiple recurrent paracenteses for alcoholic cirrhosis with ascites.  Complaining of pain and is concerned that he may have peritonitis.  Objective: Vitals:   08/12/18 0136 08/12/18 0200 08/12/18 0519 08/12/18 1100  BP: 119/70  106/72 100/65  Pulse: 90  82 89  Resp: 18  17 20   Temp: 99.6 F (37.6 C)  98.7 F (37.1 C) 99.1 F (37.3 C)  TempSrc: Oral  Oral Oral  SpO2: 96%  94% 93%  Weight: 87.3 kg 87.2 kg    Height: 5\' 8"  (1.727 m)       Intake/Output Summary (Last 24 hours) at 08/12/2018 1335 Last data filed at 08/12/2018 0233 Gross per 24 hour  Intake 160 ml  Output 225 ml  Net -65 ml   Filed Weights   08/11/18 1732 08/12/18 0136 08/12/18 0200  Weight: 93 kg 87.3 kg 87.2 kg    Examination:  General exam: Appears calm and comfortable  Respiratory system: Clear to auscultation. Respiratory effort normal. Cardiovascular system: S1 & S2 heard, RRR. No JVD, murmurs, rubs, gallops or clicks. No pedal edema. Gastrointestinal system: Abdomen is distended, firm not especially tender. No organomegaly or masses felt. Normal bowel sounds heard. Central nervous system: Alert and oriented. No focal neurological deficits. Extremities: Symmetric 5 x 5 power. Skin: No rashes, lesions or ulcers Psychiatry: Judgement and insight appear normal. Mood & affect appropriate.     Data Reviewed: I have personally reviewed following labs and imaging studies  CBC: Recent Labs  Lab 08/11/18 1855  WBC 8.6  NEUTROABS 6.2  HGB 9.3*  HCT 26.8*  MCV 101.9*  PLT 150   Basic Metabolic Panel: Recent Labs  Lab 08/11/18 1855  NA 128*  K 4.1  CL 96*  CO2 30  GLUCOSE 150*  BUN 10  CREATININE 0.94  CALCIUM 7.4*   GFR: Estimated Creatinine Clearance: 84.1 mL/min (by C-G formula based on SCr of 0.94 mg/dL). Liver Function Tests: Recent Labs  Lab 08/11/18 1855  AST 48*   ALT 27  ALKPHOS 153*  BILITOT 2.6*  PROT 7.4  ALBUMIN 2.2*   No results for input(s): LIPASE, AMYLASE in the last 168 hours. Recent Labs  Lab 08/11/18 1855  AMMONIA 49*   Coagulation Profile: Recent Labs  Lab 08/11/18 1855  INR 1.49    Sepsis Labs: Recent Labs  Lab 08/11/18 1855  LATICACIDVEN 2.3*    Recent Results (from the past 240 hour(s))  Gram stain     Status: None   Collection Time: 08/11/18  8:21 PM  Result Value Ref Range Status   Specimen Description PERITONEAL  Final   Special Requests NONE  Final   Gram Stain   Final    CYTOSPIN SMEAR NO ORGANISMS SEEN WBC PRESENT, PREDOMINANTLY MONONUCLEAR Gram Stain Report Called to,Read Back By and Verified With: TURNER,C. AT 2111 ON 08/11/2018 BY EVA Performed at [facility], [address], [place], [place] [address]    Report Status 08/11/2018 FINAL  Final  Culture, body fluid-bottle     Status: None (Preliminary result)   Collection Time: 08/11/18  8:27 PM  Result Value Ref  Range Status   Specimen Description PERITONEAL  Final   Special Requests   Final    BOTTLES DRAWN AEROBIC AND ANAEROBIC Blood Culture adequate volume   Culture   Final    NO GROWTH < 12 HOURS Performed at [facility], [address] [place], [place] [address]    Report Status PENDING  Incomplete         Radiology Studies: Dg Chest 2 View  Result Date: 08/11/2018 CLINICAL DATA:  Shortness of breath. Abdominal ascites from cirrhosis. EXAM: CHEST - 2 VIEW COMPARISON:  06/20/2018 FINDINGS: Low lung volumes with exaggeration of the mediastinal contours. Apparent soft tissue thickening in the right suprahilar region. Tortuosity and calcific atherosclerotic disease of the aorta. Interstitial pulmonary edema. No definite effusion. Bibasilar atelectasis. Osseous structures are without acute abnormality. Soft tissues are grossly normal. IMPRESSION: Low lung volumes with exaggeration of the mediastinal contours. Apparent soft tissue thickening in  the right suprahilar region. Repeated chest radiograph in full inspiration may be considered. Interstitial pulmonary edema. Bibasilar atelectasis. Electronically Signed   By: Ted Mcalpine M.D.   On: 08/11/2018 19:56        Scheduled Meds: . cholecalciferol  1,000 Units Oral Daily  . docusate sodium  200 mg Oral BID  . [START ON 08/14/2018] enoxaparin (LOVENOX) injection  40 mg Subcutaneous Q24H  . folic acid  1 mg Oral Daily  . furosemide  40 mg Intravenous Q12H  . lactulose  20 g Oral TID  . multivitamin with minerals  1 tablet Oral Daily  . nadolol  20 mg Oral Daily  . pantoprazole  40 mg Oral Daily  . sodium chloride flush  3 mL Intravenous Q12H  . spironolactone  100 mg Oral Daily  . thiamine  100 mg Oral Daily   Continuous Infusions: . sodium chloride    . cefTRIAXone (ROCEPHIN)  IV Stopped (08/11/18 2359)     LOS: 0 days    Time spent: 45 minutes    Lahoma Crocker, MD, FACP [facility] Pager [phone]  If 7PM-7AM, please contact night-coverage [email or web address] Password [email or web address] 08/12/2018, 1:35 PM

## 2018-08-12 NOTE — ED Notes (Signed)
This RN spoke with pt's sister, Ronette Deter @ [phone]. Told sister that the pt's rolling walker will be left with [employer] due to that [facility] cannot take it with them. Pt's wallet did go with pt to [facility].

## 2018-08-12 NOTE — Progress Notes (Signed)
Attempted to instruct pt on IS. Pt refused. Pt states he has 3 at home and doesn't need anymore

## 2018-08-12 NOTE — Care Management Note (Signed)
Case Management Note  Patient Details  Name: Derrick Oneill MRN: [id number] Date of Birth: [birth year]  Subjective/Objective:                 Ascites, large volume paracentesis, was jailed for DUI, prior to planned [facility] as outpatient. Plan was to have [facility] follow when PCP completed referral however incarceration interrupted this and now patient has return for exacerbation of previous symptoms.   Action/Plan:  Spoke w Dwyane Dee of [place]. Patient is not active and they anticipate referral at DC.   Expected Discharge Date:                  Expected Discharge Plan:     In-House Referral:     Discharge planning Services     Post Acute Care Choice:    Choice offered to:     DME Arranged:    DME Agency:     HH Arranged:    HH Agency:  [facility]  Status of Service:  In process, will continue to follow  If discussed at Long Length of Stay Meetings, dates discussed:    Additional Comments:  Lawerance Sabal, RN 08/12/2018, 9:51 AM

## 2018-08-13 ENCOUNTER — Encounter (HOSPITAL_COMMUNITY): Payer: Self-pay | Admitting: Interventional Radiology

## 2018-08-13 ENCOUNTER — Inpatient Hospital Stay (HOSPITAL_COMMUNITY): Payer: Medicare Other

## 2018-08-13 DIAGNOSIS — K7290 Hepatic failure, unspecified without coma: Secondary | ICD-10-CM

## 2018-08-13 HISTORY — PX: IR PERC TUN PERIT CATH WO PORT S&I /IMAG: IMG2327

## 2018-08-13 LAB — CBC
HCT: 25.8 % — ABNORMAL LOW (ref 39.0–52.0)
Hemoglobin: 8.9 g/dL — ABNORMAL LOW (ref 13.0–17.0)
MCH: 35.3 pg — AB (ref 26.0–34.0)
MCHC: 34.5 g/dL (ref 30.0–36.0)
MCV: 102.4 fL — AB (ref 78.0–100.0)
PLATELETS: 121 10*3/uL — AB (ref 150–400)
RBC: 2.52 MIL/uL — ABNORMAL LOW (ref 4.22–5.81)
RDW: 14.8 % (ref 11.5–15.5)
WBC: 14.2 10*3/uL — AB (ref 4.0–10.5)

## 2018-08-13 LAB — BASIC METABOLIC PANEL
Anion gap: 8 (ref 5–15)
BUN: 14 mg/dL (ref 8–23)
CALCIUM: 7.4 mg/dL — AB (ref 8.9–10.3)
CO2: 25 mmol/L (ref 22–32)
Chloride: 92 mmol/L — ABNORMAL LOW (ref 98–111)
Creatinine, Ser: 0.97 mg/dL (ref 0.61–1.24)
GFR calc Af Amer: >60 mL/min (ref >60)
GLUCOSE: 137 mg/dL — AB (ref 70–99)
Potassium: 3.8 mmol/L (ref 3.5–5.1)
Sodium: 125 mmol/L — ABNORMAL LOW (ref 135–145)

## 2018-08-13 LAB — PROTIME-INR
INR: 1.87
PROTHROMBIN TIME: 21.3 s — AB (ref 11.4–15.2)

## 2018-08-13 LAB — PATHOLOGIST SMEAR REVIEW

## 2018-08-13 MED ORDER — MIDAZOLAM HCL 2 MG/2ML IJ SOLN
INTRAMUSCULAR | Status: AC | PRN
  Administered 2018-08-13: 1 mg via INTRAVENOUS

## 2018-08-13 MED ORDER — FENTANYL CITRATE (PF) 100 MCG/2ML IJ SOLN
INTRAMUSCULAR | Status: AC | PRN
  Administered 2018-08-13: 25 ug via INTRAVENOUS

## 2018-08-13 MED ORDER — CEFAZOLIN SODIUM-DEXTROSE 2-4 GM/100ML-% IV SOLN
INTRAVENOUS | Status: AC
  Administered 2018-08-13: 2000 mg
  Filled 2018-08-13: qty 100

## 2018-08-13 MED ORDER — FUROSEMIDE 40 MG PO TABS
40.0000 mg | ORAL_TABLET | Freq: Every day | ORAL | Status: DC
  Administered 2018-08-13: 40 mg via ORAL
  Filled 2018-08-13: qty 1

## 2018-08-13 MED ORDER — MIDAZOLAM HCL 2 MG/2ML IJ SOLN
INTRAMUSCULAR | Status: AC
  Filled 2018-08-13: qty 2

## 2018-08-13 MED ORDER — LIDOCAINE HCL (PF) 1 % IJ SOLN
INTRAMUSCULAR | Status: AC | PRN
  Administered 2018-08-13: 20 mL

## 2018-08-13 MED ORDER — SPIRONOLACTONE 50 MG PO TABS
50.0000 mg | ORAL_TABLET | Freq: Every day | ORAL | Status: DC
  Administered 2018-08-13 – 2018-08-20 (×7): 50 mg via ORAL
  Filled 2018-08-13 (×7): qty 1

## 2018-08-13 MED ORDER — FENTANYL CITRATE (PF) 100 MCG/2ML IJ SOLN
INTRAMUSCULAR | Status: AC
  Filled 2018-08-13: qty 2

## 2018-08-13 NOTE — Care Management Note (Signed)
Case Management Note  Patient Details  Name: Derrick Oneill MRN: [id number] Date of Birth: 1951-06-29  Subjective/Objective:                    Action/Plan:  Discuss discharge plan with patient at bedside , confirmed face sheet information. Patient wants to discharge to home tomorrow 08/14/18 with [facility] and Pleurx catheter.   Have asked bedside nurse to order a box of 10 , 1000cc Pleurx catheter kits. Patient will take these home at discharge. Bedside nurse will also teach patient how to change pleux before discharge. Patient aware and voiced understanding.   [employer] paperwork work completed and fax and mailed to [employer].   Referral given to and accepted by Cassandra at [facility] .   Expected Discharge Date:                  Expected Discharge Plan:  Home w Hospice Care  In-House Referral:     Discharge planning Services  CM Consult  Post Acute Care Choice:    Choice offered to:  Patient  DME Arranged:    DME Agency:     HH Arranged:  RN HH Agency:  [facility]  Status of Service:  In process, will continue to follow  If discussed at Long Length of Stay Meetings, dates discussed:    Additional Comments:  [name], RN 08/13/2018, 12:24 PM

## 2018-08-13 NOTE — Plan of Care (Signed)
  Problem: Education: Goal: Knowledge of General Education information will improve Description Including pain rating scale, medication(s)/side effects and non-pharmacologic comfort measures Outcome: Progressing   

## 2018-08-13 NOTE — Progress Notes (Addendum)
PROGRESS NOTE    Derrick Oneill  BJY:[id number] DOB: [birth year] DOA: 08/11/2018 [facility]    Brief Narrative: Derrick Oneill is a 67 y.o. male with medical history significant for alcoholic cirrhosis with resistant ascites, now presenting to the emergency department for evaluation of abdominal distention, shortness of breath, lower extremity erythema, and chills.  Patient was hospitalized last month after a similar presentation, underwent a therapeutic paracentesis, and there were plans for placement of a peritoneal catheter by IR after discharge, but patient became incarcerated and this was not performed.  He also planned to enroll in hospice care with [facility] but has not yewt been able to do so. He has since returned home, has had recurrent abdominal distention with discomfort and increasing shortness of breath.  He also reports increasing erythema and tenderness to the bilateral lower extremities and reports chills at home.  Denies chest pain, palpitations, or cough.  Patient is admitted for further management of ascites with Pleurx catheter drain placement and to arrange outpatient hospice  Assessment & Plan:   1. Alcoholic cirrhosis with resistant ascites  -symptomatic recurrent ascites  -he has required recurrent paracentesis despite diuretics and was reportedly going to have drain placed by IR, unfortunately he got incarcerated before this could be accomplished  -2 L drained at [facility] on Saturday, had an additional 2 L drained in radiology today before catheter placement, cell count analysis is not suggestive of SBP -Patient has advanced liver disease with a MELD score of 22, has had multiple discussions regarding initiating hospice services at home -R Pleurx tunneled peritoneal drain placed and 2 L drained by IR this morning -Needs education about using the catheter -case management consulted, For [facility] -Continue  beta-blocker, change to PO Diuretics  2. Cellulitis  - Patient has marked bilateral LE edema with serous weeping and appears to have a secondary cellulitis with intense erythema, heat and tenderness  - Continue empiric Rocephin, keep legs elevated, diurese    3. Hyponatremia - her baseline sodium is 128 to 130 - Suspect this is secondary to advanced versus and in part diuretics - monitor  4. Pulmonary edema/VOlume overload - CXR with interstitial edema in ED - Echo from July 2019 with normal EF and normal diastolic dysfunction, mild MR and no other valvular disease   - Likely secondary to cirrhosis with anasarca  - continue Lasix and Aldactone as blood pressure tolerates  5. Macrocytic anemia   - due to alcoholism - Stable    6.  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, [facility] will [facility] per his wishes, continue R Pleurx peritoneal catheter for comfort  DVT prophylaxis: Lovenox Code Status: DNR  Family Communication: Discussed with patient  Consults called: None Admission status: Inpatient   Consultants:   Interventional radiology for Pleurx drainage catheter requested upon admission and planned for Monday  Antimicrobials: IV ceftriaxone #2   Subjective: -just back from interventional radiology after catheter placement, complaints of abdominal discomfort, lower extremity discomfort and pain  Objective: Vitals:   08/13/18 0910 08/13/18 0915 08/13/18 0925 08/13/18 0935  BP: (!) 92/56 (!) 95/58 (!) 92/58 92/60  Pulse: 80 79 78 78  Resp: 17 20 20 18   Temp:      TempSrc:      SpO2: 93% 93% 93% 95%  Weight:      Height:        Intake/Output Summary (  Last 24 hours) at 08/13/2018 1347 Last data filed at 08/13/2018 0800 Gross per 24 hour  Intake 564 ml  Output 978.8 ml  Net -414.8 ml   Filed Weights   08/12/18 0136 08/12/18 0200 08/13/18 0500  Weight: 87.3 kg 87.2 kg 84.9 kg    Examination:  Gen: chronically  ill-appearing male, Awake, Alert, Oriented X 3,   HEENT: PERRLA, Neck supple, no JVD Lungs: Good air movement bilaterally, CTAB CVS: S1-S2/regular rate and rhythm, soft systolic murmur noted Abd: soft, distended, peritoneal catheter noted, fluid thrill present bowel sounds present Extremities: 2+ edema, left greater than right erythema and swelling tenderness, scaling and excoriations Skin: As above Neuro: moves all extremities, no localizing signs, no asterixis Psychiatry: Judgement and insight appear normal. Mood & affect appropriate.   Data Reviewed: I have personally reviewed following labs and imaging studies  CBC: Recent Labs  Lab 08/11/18 1855  WBC 8.6  NEUTROABS 6.2  HGB 9.3*  HCT 26.8*  MCV 101.9*  PLT 150   Basic Metabolic Panel: Recent Labs  Lab 08/11/18 1855  NA 128*  K 4.1  CL 96*  CO2 30  GLUCOSE 150*  BUN 10  CREATININE 0.94  CALCIUM 7.4*   GFR: Estimated Creatinine Clearance: 83.1 mL/min (by C-G formula based on SCr of 0.94 mg/dL). Liver Function Tests: Recent Labs  Lab 08/11/18 1855  AST 48*  ALT 27  ALKPHOS 153*  BILITOT 2.6*  PROT 7.4  ALBUMIN 2.2*   No results for input(s): LIPASE, AMYLASE in the last 168 hours. Recent Labs  Lab 08/11/18 1855  AMMONIA 49*   Coagulation Profile: Recent Labs  Lab 08/11/18 1855 08/13/18 0709  INR 1.49 1.87    Sepsis Labs: Recent Labs  Lab 08/11/18 1855  LATICACIDVEN 2.3*    Recent Results (from the past 240 hour(s))  Gram stain     Status: None   Collection Time: 08/11/18  8:21 PM  Result Value Ref Range Status   Specimen Description PERITONEAL  Final   Special Requests NONE  Final   Gram Stain   Final    CYTOSPIN SMEAR NO ORGANISMS SEEN WBC PRESENT, PREDOMINANTLY MONONUCLEAR Gram Stain Report Called to,Read Back By and Verified With: TURNER,C. AT 2111 ON 08/11/2018 BY EVA Performed at [facility], [address], [place], [place] [address]    Report Status 08/11/2018 FINAL  Final    Culture, body fluid-bottle     Status: None (Preliminary result)   Collection Time: 08/11/18  8:27 PM  Result Value Ref Range Status   Specimen Description PERITONEAL  Final   Special Requests   Final    BOTTLES DRAWN AEROBIC AND ANAEROBIC Blood Culture adequate volume   Culture   Final    NO GROWTH 2 DAYS Performed at [facility], [address], [place], [place] [address]    Report Status PENDING  Incomplete         Radiology Studies: Dg Chest 2 View  Result Date: 08/11/2018 CLINICAL DATA:  Shortness of breath. Abdominal ascites from cirrhosis. EXAM: CHEST - 2 VIEW COMPARISON:  06/20/2018 FINDINGS: Low lung volumes with exaggeration of the mediastinal contours. Apparent soft tissue thickening in the right suprahilar region. Tortuosity and calcific atherosclerotic disease of the aorta. Interstitial pulmonary edema. No definite effusion. Bibasilar atelectasis. Osseous structures are without acute abnormality. Soft tissues are grossly normal. IMPRESSION: Low lung volumes with exaggeration of the mediastinal contours. Apparent soft tissue thickening in the right suprahilar region. Repeated chest radiograph in full inspiration  may be considered. Interstitial pulmonary edema. Bibasilar atelectasis. Electronically Signed   By: Ted Mcalpine M.D.   On: 08/11/2018 19:56   Ir Perc Noralee Stain Perit Cath Wo Port  Result Date: 08/13/2018 CLINICAL DATA:  Recurrent ascites secondary to cirrhosis, requiring repeated large volume paracentesis. Tunneled pleural drain catheter requested. See yesterday's consultation. EXAM: TUNNELED PERITONEAL CUFFED DRAIN CATHETER PLACEMENT UNDER ULTRASOUND AND FLUOROSCOPIC GUIDANCE PARACENTESIS FLUOROSCOPY TIME:  0.1 minutes; 30 uGym2 DAP TECHNIQUE: Survey ultrasound of the abdomen was performed and an appropriate skin entry site was selected. As antibiotic prophylaxis, cefazolin 2 g was ordered pre-procedure and administered intravenously within one hour of incision. Skin  site was marked, prepped with Betadine, and draped in usual sterile fashion, and infiltrated locally with 1% lidocaine. Intravenous Fentanyl and Versed were administered as conscious sedation during continuous monitoring of the patient's level of consciousness and physiological / cardiorespiratory status by the radiology RN, with a total moderate sedation time of 13 minutes. Under real-time ultrasound guidance, an 18 gauge sheath needle was advanced into the peritoneal space. Ultrasound imaging documentation was saved. Ascites could be aspirated. An Amplatz guidewire advanced through the sheath easily. The PleurX cuffed drain catheter was tunneled from an appropriate skin entry site to the dermatotomy. The sheath was exchanged over the wire for a peel-away sheath, through which the PleurX catheter was advanced into the peritoneal space. Fluoroscopy confirmed appropriate position of the catheter. The catheter was secured externally with 0 Prolene suture. The dermatotomy was closed with Dermabond. Total of 3.8 liters clear yellow ascites were removed. The patient tolerated the procedure well, with no immediate complication. IMPRESSION: 1. Technically successful tunneled cuffed peritoneal drain catheter placement. 2. Paracentesis removing 3.8 liters ascites. Electronically Signed   By: Corlis Leak M.D.   On: 08/13/2018 09:53        Scheduled Meds: . cholecalciferol  1,000 Units Oral Daily  . docusate sodium  200 mg Oral BID  . [START ON 08/14/2018] enoxaparin (LOVENOX) injection  40 mg Subcutaneous Q24H  . fentaNYL      . folic acid  1 mg Oral Daily  . lactulose  20 g Oral TID  . midazolam      . multivitamin with minerals  1 tablet Oral Daily  . nadolol  20 mg Oral Daily  . pantoprazole  40 mg Oral Daily  . sodium chloride flush  3 mL Intravenous Q12H  . thiamine  100 mg Oral Daily   Continuous Infusions: . sodium chloride    . cefTRIAXone (ROCEPHIN)  IV 1 g (08/12/18 2201)     LOS: 1 day     Time spent: 35 minutes    Zannie Cove, MD [facility] Page via [email or web address] password [email or web address]  If 7PM-7AM, please contact night-coverage  08/13/2018, 1:47 PM

## 2018-08-13 NOTE — Procedures (Signed)
  Procedure: R pleurX tunneled peritoneal drain placed, R paracentesis   EBL:   minimal Complications:  none immediate  See full dictation in [employer].  Thora Lance MD Main # [phone] Pager  [phone]

## 2018-08-14 LAB — MRSA PCR SCREENING: MRSA by PCR: POSITIVE — AB

## 2018-08-14 MED ORDER — FUROSEMIDE 10 MG/ML IJ SOLN
80.0000 mg | Freq: Two times a day (BID) | INTRAMUSCULAR | Status: DC
  Administered 2018-08-14 – 2018-08-17 (×7): 80 mg via INTRAVENOUS
  Filled 2018-08-14 (×7): qty 8

## 2018-08-14 MED ORDER — VANCOMYCIN HCL IN DEXTROSE 1-5 GM/200ML-% IV SOLN
1000.0000 mg | Freq: Two times a day (BID) | INTRAVENOUS | Status: DC
  Administered 2018-08-14 – 2018-08-15 (×2): 1000 mg via INTRAVENOUS
  Filled 2018-08-14 (×5): qty 200

## 2018-08-14 MED ORDER — CHLORHEXIDINE GLUCONATE CLOTH 2 % EX PADS
6.0000 | MEDICATED_PAD | Freq: Every day | CUTANEOUS | Status: AC
  Administered 2018-08-15 – 2018-08-19 (×5): 6 via TOPICAL

## 2018-08-14 MED ORDER — POTASSIUM CHLORIDE CRYS ER 20 MEQ PO TBCR
40.0000 meq | EXTENDED_RELEASE_TABLET | Freq: Once | ORAL | Status: AC
  Administered 2018-08-14: 40 meq via ORAL
  Filled 2018-08-14: qty 2

## 2018-08-14 MED ORDER — MUPIROCIN 2 % EX OINT
1.0000 "application " | TOPICAL_OINTMENT | Freq: Two times a day (BID) | CUTANEOUS | Status: AC
  Administered 2018-08-14 – 2018-08-19 (×9): 1 via NASAL
  Filled 2018-08-14 (×2): qty 22

## 2018-08-14 NOTE — Progress Notes (Signed)
Referring Physician(s): P Jomarie Longs  Supervising Physician: Gilmer Mor  Patient Status:  [facility] - In-pt  Chief Complaint:  RLQ peritoneal PleurX catheter placed in IR 9/9   Subjective:  Doing well No complaints No pain  Allergies: Patient has no known allergies.  Medications: Prior to Admission medications   Medication Sig Start Date End Date Taking? Authorizing Provider  cholecalciferol (VITAMIN D) 1000 units tablet Take 1,000 Units by mouth daily.   Yes [provider]  clobetasol cream (TEMOVATE) 0.05 % Apply 1 application topically 2 (two) times daily as needed (Apply a thin layer as needed for psoriasis).   Yes [provider]  docusate sodium (COLACE) 100 MG capsule Take 200 mg by mouth 2 (two) times daily.    Yes [provider]  ferrous sulfate 325 (65 FE) MG tablet Take 325 mg by mouth daily with breakfast.   Yes [provider]  folic acid (FOLVITE) 1 MG tablet Take 1 mg by mouth daily.   Yes [provider]  lactulose (CHRONULAC) 10 GM/15ML solution Take 20 g by mouth 3 (three) times daily.    Yes [provider]  Multiple Vitamin (MULTIVITAMIN WITH MINERALS) TABS tablet Take 1 tablet by mouth daily. 06/02/18  Yes Johnson, Clanford L, MD  nadolol (CORGARD) 20 MG tablet Take 1 tablet (20 mg total) by mouth daily. 06/28/18  Yes Vassie Loll, MD  potassium chloride SA (K-DUR,KLOR-CON) 20 MEQ tablet Take 20 mEq by mouth 2 (two) times daily.   Yes [provider]  spironolactone (ALDACTONE) 100 MG tablet Take 1 tablet (100 mg total) by mouth daily. 06/28/18  Yes Vassie Loll, MD  thiamine 100 MG tablet Take 100 mg by mouth daily.   Yes [provider]  torsemide (DEMADEX) 20 MG tablet Take 1 tablet (20 mg total) by mouth daily. 06/27/18  Yes Vassie Loll, MD  Vitamin D, Ergocalciferol, (DRISDOL) 50000 units CAPS capsule Take 50,000 Units by mouth every 7 (seven) days.   Yes [provider]       Vital Signs: BP 96/66 (BP Location: Right Arm)   Pulse 84   Temp 98.1 F (36.7 C) (Oral)   Resp 18   Ht 5\' 8"  (1.727 m)   Wt 187 lb 2.7 oz (84.9 kg)   SpO2 95%   BMI 28.46 kg/m   Physical Exam  Abdominal: He exhibits distension. There is no tenderness.  Skin: Skin is warm and dry.  Drain site is clean and dry NT  No bleeding   Vitals reviewed.   Imaging: Dg Chest 2 View  Result Date: 08/11/2018 CLINICAL DATA:  Shortness of breath. Abdominal ascites from cirrhosis. EXAM: CHEST - 2 VIEW COMPARISON:  06/20/2018 FINDINGS: Low lung volumes with exaggeration of the mediastinal contours. Apparent soft tissue thickening in the right suprahilar region. Tortuosity and calcific atherosclerotic disease of the aorta. Interstitial pulmonary edema. No definite effusion. Bibasilar atelectasis. Osseous structures are without acute abnormality. Soft tissues are grossly normal. IMPRESSION: Low lung volumes with exaggeration of the mediastinal contours. Apparent soft tissue thickening in the right suprahilar region. Repeated chest radiograph in full inspiration may be considered. Interstitial pulmonary edema. Bibasilar atelectasis. Electronically Signed   By: Ted Mcalpine M.D.   On: 08/11/2018 19:56   Ir Perc Noralee Stain Perit Cath Wo Port  Result Date: 08/13/2018 CLINICAL DATA:  Recurrent ascites secondary to cirrhosis, requiring repeated large volume paracentesis. Tunneled pleural drain catheter requested. See yesterday's consultation. EXAM: TUNNELED PERITONEAL CUFFED DRAIN CATHETER PLACEMENT  UNDER ULTRASOUND AND FLUOROSCOPIC GUIDANCE PARACENTESIS FLUOROSCOPY TIME:  0.1 minutes; 30 uGym2 DAP TECHNIQUE: Survey ultrasound of the abdomen was performed and an appropriate skin entry site was selected. As antibiotic prophylaxis, cefazolin 2 g was ordered pre-procedure and administered intravenously within one hour of incision. Skin site was marked, prepped with Betadine, and draped in usual sterile  fashion, and infiltrated locally with 1% lidocaine. Intravenous Fentanyl and Versed were administered as conscious sedation during continuous monitoring of the patient's level of consciousness and physiological / cardiorespiratory status by the radiology RN, with a total moderate sedation time of 13 minutes. Under real-time ultrasound guidance, an 18 gauge sheath needle was advanced into the peritoneal space. Ultrasound imaging documentation was saved. Ascites could be aspirated. An Amplatz guidewire advanced through the sheath easily. The PleurX cuffed drain catheter was tunneled from an appropriate skin entry site to the dermatotomy. The sheath was exchanged over the wire for a peel-away sheath, through which the PleurX catheter was advanced into the peritoneal space. Fluoroscopy confirmed appropriate position of the catheter. The catheter was secured externally with 0 Prolene suture. The dermatotomy was closed with Dermabond. Total of 3.8 liters clear yellow ascites were removed. The patient tolerated the procedure well, with no immediate complication. IMPRESSION: 1. Technically successful tunneled cuffed peritoneal drain catheter placement. 2. Paracentesis removing 3.8 liters ascites. Electronically Signed   By: Corlis Leak M.D.   On: 08/13/2018 09:53    Labs:  CBC: Recent Labs    07/15/18 1252 07/31/18 0730 08/11/18 1855 08/13/18 1458  WBC 7.1 5.3 8.6 14.2*  HGB 8.5* 9.5* 9.3* 8.9*  HCT 24.5* 28.5* 26.8* 25.8*  PLT 121* 120* 150 121*    COAGS: Recent Labs    07/20/18 0349 07/31/18 0730 08/11/18 1855 08/13/18 0709  INR 1.51 1.46 1.49 1.87    BMP: Recent Labs    07/19/18 0418 07/31/18 0730 08/11/18 1855 08/13/18 1458  NA 129* 135 128* 125*  K 3.5 4.1 4.1 3.8  CL 96* 100 96* 92*  CO2 26 28 30 25   GLUCOSE 118* 86 150* 137*  BUN 13 15 10 14   CALCIUM 7.7* 8.6* 7.4* 7.4*  CREATININE 1.03 1.04 0.94 0.97  GFRNONAA >60 >60 >60 >60  GFRAA >60 >60 >60 >60    LIVER FUNCTION  TESTS: Recent Labs    06/24/18 0830 06/25/18 0404 07/15/18 1252 08/11/18 1855  BILITOT 3.0* 2.2* 4.7* 2.6*  AST 36 45* 58* 48*  ALT 15 18 34 27  ALKPHOS 68 70 115 153*  PROT 6.2* 6.1* 7.5 7.4  ALBUMIN 2.1* 2.1* 2.4* 2.2*    Assessment and Plan:  Peritoneal pleurx drain intact Hospice care at home- hopefully   Electronically Signed: Orvis Stann A, PA-C 08/14/2018, 1:39 PM   I spent a total of 15 Minutes at the the patient's bedside AND on the patient's hospital floor or unit, greater than 50% of which was counseling/coordinating care for Pleurx catheter

## 2018-08-14 NOTE — Progress Notes (Addendum)
PROGRESS NOTE    Derrick Oneill  ZOX:[id number] DOB: 1951/07/31 DOA: 08/11/2018 PCP: [facility]    Brief Narrative: Derrick Oneill is a 67 y.o. male with medical history significant for alcoholic cirrhosis with resistant ascites, now presenting to the emergency department for evaluation of abdominal distention, shortness of breath, lower extremity erythema, and chills.  Patient was hospitalized last month after a similar presentation, underwent a therapeutic paracentesis, and there were plans for placement of a peritoneal catheter by IR after discharge, but patient became incarcerated and this was not performed.  He also planned to enroll in hospice care with [facility] but has not yewt been able to do so. He has since returned home, has had recurrent abdominal distention with discomfort and increasing shortness of breath.  He also reports increasing erythema and tenderness to the bilateral lower extremities and reports chills at home.  Denies chest pain, palpitations, or cough.  Patient is admitted for further management of ascites with Pleurx catheter drain placement and to arrange outpatient hospice  Assessment & Plan:   1. Alcoholic cirrhosis with resistant ascites  -symptomatic recurrent ascites  -he has required recurrent paracentesis despite diuretics and was reportedly going to have drain placed by IR, unfortunately he got incarcerated before this could be accomplished  -2 L drained at [facility] on Saturday, had an additional 2 L drained in radiology 9/9 before peritoneal Pleurx catheter placement, cell count analysis is not suggestive of SBP -Patient has advanced liver disease with a MELD score of 22, has had multiple discussions regarding initiating hospice services at home -Pleurx tunneled peritoneal drain placed 9/9 by IR and 2 L drained the, had another liter drained yesterday, continue daily drainage as tolerated -case management consulted, home  hospice services set up via [facility] -Continue nadolol, lactulose, continue IV Lasix due to severe symptomatic lower extremity edema causing secondary infection -Home in 1-2 days when symptoms more manageable, prognosis very poor regardless  2. Cellulitis  - Patient has marked bilateral LE edema with serous weeping and appears to have a secondary infection w/ cellulitis with intense erythema, heat and tenderness esp of RLE - stop IV Rocephin, change to IV vancomycin - Elevate lower extremities, diurese as tolerated - Home with hospice when less symptomatic from his infection  3. Hyponatremia - baseline sodium is 128, worse now but asymptomatic - secondary to advanced decompensated cirrhosis and in part diuretics - monitor, poor prognostic factor, mentation stable -Home with hospice  4. Pulmonary edema/VOlume overload - CXR with interstitial edema in ED - Echo from July 2019 with normal EF and normal diastolic dysfunction, mild MR and no other valvular disease   - Likely secondary to cirrhosis with anasarca  - continue IV Lasix and Aldactone as blood pressure tolerates  5. Macrocytic anemia   - due to alcoholism - Stable    6.  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, Patient was [facility] per his wishes, continue R Pleurx peritoneal catheter for comfort  DVT prophylaxis: Lovenox Code Status: DNR  Family Communication: Discussed with patient  Dispo: home with Hospice services in 1-2days  Consults called: None Admission status: Inpatient   Consultants:   Interventional radiology for Pleurx drainage catheter requested upon admission and planned for Monday  Antimicrobials: IV ceftriaxone #2   Subjective: -was able to drain 1 L from the peritoneal Pleurx catheter yesterday, continues to report severe pain redness and swelling  of his right leg extending to his right thigh  Objective: Vitals:   08/13/18 0935  08/13/18 2229 08/14/18 0524 08/14/18 1414  BP: 92/60 (!) 95/52 96/66 93/63   Pulse: 78 80 84 74  Resp: 18 19 18    Temp:  98.2 F (36.8 C) 98.1 F (36.7 C) 98.3 F (36.8 C)  TempSrc:  Axillary Oral Oral  SpO2: 95% 96% 95% 93%  Weight:      Height:        Intake/Output Summary (Last 24 hours) at 08/14/2018 1445 Last data filed at 08/14/2018 1345 Gross per 24 hour  Intake 1020 ml  Output 2000 ml  Net -980 ml   Filed Weights   08/12/18 0136 08/12/18 0200 08/13/18 0500  Weight: 87.3 kg 87.2 kg 84.9 kg    Examination:  Gen: cachectic chronically ill-appearing male sitting in bed, uncomfortable appearing HEENT: scleral icterus noted Lungs: decreased breath sounds at both bases CVS: S1-S2/regular rate and rhythm Abd: Is soft, mildly distended,peritoneal catheter noted, fluid to present Extremities: 2-3+ edema, right greater than left with severe erythema and swelling worse in the right lower leg, tracking up to the right eye, diffuse scaling and excoriations Skin: As above Neuro: Moves all extremities, no localizing signs, no asterixis  Data Reviewed: I have personally reviewed following labs and imaging studies  CBC: Recent Labs  Lab 08/11/18 1855 08/13/18 1458  WBC 8.6 14.2*  NEUTROABS 6.2  --   HGB 9.3* 8.9*  HCT 26.8* 25.8*  MCV 101.9* 102.4*  PLT 150 121*   Basic Metabolic Panel: Recent Labs  Lab 08/11/18 1855 08/13/18 1458  NA 128* 125*  K 4.1 3.8  CL 96* 92*  CO2 30 25  GLUCOSE 150* 137*  BUN 10 14  CREATININE 0.94 0.97  CALCIUM 7.4* 7.4*   GFR: Estimated Creatinine Clearance: 80.5 mL/min (by C-G formula based on SCr of 0.97 mg/dL). Liver Function Tests: Recent Labs  Lab 08/11/18 1855  AST 48*  ALT 27  ALKPHOS 153*  BILITOT 2.6*  PROT 7.4  ALBUMIN 2.2*   No results for input(s): LIPASE, AMYLASE in the last 168 hours. Recent Labs  Lab 08/11/18 1855  AMMONIA 49*   Coagulation Profile: Recent Labs  Lab 08/11/18 1855 08/13/18 0709  INR  1.49 1.87    Sepsis Labs: Recent Labs  Lab 08/11/18 1855  LATICACIDVEN 2.3*    Recent Results (from the past 240 hour(s))  Gram stain     Status: None   Collection Time: 08/11/18  8:21 PM  Result Value Ref Range Status   Specimen Description PERITONEAL  Final   Special Requests NONE  Final   Gram Stain   Final    CYTOSPIN SMEAR NO ORGANISMS SEEN WBC PRESENT, PREDOMINANTLY MONONUCLEAR Gram Stain Report Called to,Read Back By and Verified With: TURNER,C. AT 2111 ON 08/11/2018 BY EVA Performed at [facility], [address], [place], [place] [address]    Report Status 08/11/2018 FINAL  Final  Culture, body fluid-bottle     Status: None (Preliminary result)   Collection Time: 08/11/18  8:27 PM  Result Value Ref Range Status   Specimen Description PERITONEAL  Final   Special Requests   Final    BOTTLES DRAWN AEROBIC AND ANAEROBIC Blood Culture adequate volume   Culture   Final    NO GROWTH 3 DAYS Performed at [facility], [address] [place], [place] [address]    Report Status PENDING  Incomplete  Radiology Studies: Ir Perc Noralee Stain Perit [facility]  Result Date: 08/13/2018 CLINICAL DATA:  Recurrent ascites secondary to cirrhosis, requiring repeated large volume paracentesis. Tunneled pleural drain catheter requested. See yesterday's consultation. EXAM: TUNNELED PERITONEAL CUFFED DRAIN CATHETER PLACEMENT UNDER ULTRASOUND AND FLUOROSCOPIC GUIDANCE PARACENTESIS FLUOROSCOPY TIME:  0.1 minutes; 30 uGym2 DAP TECHNIQUE: Survey ultrasound of the abdomen was performed and an appropriate skin entry site was selected. As antibiotic prophylaxis, cefazolin 2 g was ordered pre-procedure and administered intravenously within one hour of incision. Skin site was marked, prepped with Betadine, and draped in usual sterile fashion, and infiltrated locally with 1% lidocaine. Intravenous Fentanyl and Versed were administered as conscious sedation during continuous monitoring of the patient's  level of consciousness and physiological / cardiorespiratory status by the radiology RN, with a total moderate sedation time of 13 minutes. Under real-time ultrasound guidance, an 18 gauge sheath needle was advanced into the peritoneal space. Ultrasound imaging documentation was saved. Ascites could be aspirated. An Amplatz guidewire advanced through the sheath easily. The PleurX cuffed drain catheter was tunneled from an appropriate skin entry site to the dermatotomy. The sheath was exchanged over the wire for a peel-away sheath, through which the PleurX catheter was advanced into the peritoneal space. Fluoroscopy confirmed appropriate position of the catheter. The catheter was secured externally with 0 Prolene suture. The dermatotomy was closed with Dermabond. Total of 3.8 liters clear yellow ascites were removed. The patient tolerated the procedure well, with no immediate complication. IMPRESSION: 1. Technically successful tunneled cuffed peritoneal drain catheter placement. 2. Paracentesis removing 3.8 liters ascites. Electronically Signed   By: Corlis Leak M.D.   On: 08/13/2018 09:53        Scheduled Meds: . cholecalciferol  1,000 Units Oral Daily  . docusate sodium  200 mg Oral BID  . enoxaparin (LOVENOX) injection  40 mg Subcutaneous Q24H  . folic acid  1 mg Oral Daily  . furosemide  80 mg Intravenous Q12H  . lactulose  20 g Oral TID  . multivitamin with minerals  1 tablet Oral Daily  . nadolol  20 mg Oral Daily  . pantoprazole  40 mg Oral Daily  . sodium chloride flush  3 mL Intravenous Q12H  . spironolactone  50 mg Oral Daily  . thiamine  100 mg Oral Daily   Continuous Infusions: . sodium chloride    . vancomycin 1,000 mg (08/14/18 1329)     LOS: 2 days    Time spent: 25 minutes    Zannie Cove, MD [facility]  Page via [email or web address] password [email or web address]  If 7PM-7AM, please contact night-coverage  08/14/2018, 2:45 PM

## 2018-08-14 NOTE — Progress Notes (Signed)
Pharmacy Antibiotic Note  Derrick Oneill is a 67 y.o. male admitted on 08/11/2018 with cellulitis.  Pharmacy has been consulted for Vancomycin dosing. Pt was on Rocephin (Day #4) but worsening so changing to Vancomycin. WBC up to 14.2. Afeb.   SCr stable, UOP ok  Plan: Vancomycin 1gm IV q12h Will f/u micro data, renal function, and pt's clinical condition Vanc trough prn   Height: 5\' 8"  (172.7 cm) Weight: 187 lb 2.7 oz (84.9 kg) IBW/kg (Calculated) : 68.4  Temp (24hrs), Avg:98.2 F (36.8 C), Min:98.1 F (36.7 C), Max:98.2 F (36.8 C)  Recent Labs  Lab 08/11/18 1855 08/13/18 1458  WBC 8.6 14.2*  CREATININE 0.94 0.97  LATICACIDVEN 2.3*  --     Estimated Creatinine Clearance: 80.5 mL/min (by C-G formula based on SCr of 0.97 mg/dL).    No Known Allergies  Antimicrobials this admission: 9/7 Ceftriaxone >>9/10 9/10 Vanc >>   Dose adjustments this admission: N/A  Microbiology results: 9/7 Peritoneal washings >>ngtd  Thank you for allowing pharmacy to be a part of this patient's care.  Christoper Fabian, PharmD, BCPS Clinical pharmacist  **Pharmacist phone directory can now be found on [email or web address] (PW [email or web address]).  Listed under [facility]. 08/14/2018 8:47 AM

## 2018-08-15 LAB — BASIC METABOLIC PANEL
ANION GAP: 8 (ref 5–15)
BUN: 18 mg/dL (ref 8–23)
CALCIUM: 7.4 mg/dL — AB (ref 8.9–10.3)
CO2: 24 mmol/L (ref 22–32)
Chloride: 91 mmol/L — ABNORMAL LOW (ref 98–111)
Creatinine, Ser: 1.31 mg/dL — ABNORMAL HIGH (ref 0.61–1.24)
GFR calc non Af Amer: 56 mL/min — ABNORMAL LOW (ref >60)
GLUCOSE: 135 mg/dL — AB (ref 70–99)
POTASSIUM: 4.4 mmol/L (ref 3.5–5.1)
Sodium: 123 mmol/L — ABNORMAL LOW (ref 135–145)

## 2018-08-15 MED ORDER — VANCOMYCIN HCL 10 G IV SOLR
1250.0000 mg | INTRAVENOUS | Status: DC
  Administered 2018-08-15 – 2018-08-19 (×5): 1250 mg via INTRAVENOUS
  Filled 2018-08-15 (×5): qty 1250

## 2018-08-15 NOTE — Progress Notes (Signed)
Pharmacy Antibiotic Note  Derrick Oneill is a 67 y.o. male admitted on 08/11/2018 with cellulitis.  Pharmacy has been consulted for Vancomycin dosing. Today is Day #2 of Vancomycin and total abx Day #5. WBC up to 14.2. Afeb.   Noted bump in SCr up to 1.31, est CrCl 59 ml/min  Antimicrobials this admission: 9/7 Ceftriaxone >>9/10 9/10 Vanc >>   Dose adjustments this admission: N/A  Microbiology results: 9/7 Peritoneal washings >>ngtd  Plan: Change Vancomycin to 1250mg  IV q24h Will f/u micro data, renal function, and pt's clinical condition BMET in a.m. Vanc trough  at [facility]    Height: 5\' 8"  (172.7 cm) Weight: 187 lb 2.7 oz (84.9 kg) IBW/kg (Calculated) : 68.4  Temp (24hrs), Avg:98.3 F (36.8 C), Min:98.2 F (36.8 C), Max:98.4 F (36.9 C)  Recent Labs  Lab 08/11/18 1855 08/13/18 1458 08/15/18 0543  WBC 8.6 14.2*  --   CREATININE 0.94 0.97 1.31*  LATICACIDVEN 2.3*  --   --     Estimated Creatinine Clearance: 59.6 mL/min (A) (by C-G formula based on SCr of 1.31 mg/dL (H)).    No Known Allergies    Thank you for allowing pharmacy to be a part of this patient's care.  Christoper Fabian, PharmD, BCPS Clinical pharmacist  **Pharmacist phone directory can now be found on [email or web address] ([email or web address]).  Listed under [facility]. 08/15/2018 9:41 AM

## 2018-08-15 NOTE — Care Management Note (Signed)
Case Management Note  Patient Details  Name: Derrick Oneill MRN: [id number] Date of Birth: Apr 17, 1951  Subjective/Objective:                    Action/Plan:  Anticipated discharge date 08/16/18 ,Left message for  Cassandra at [facility]  Expected Discharge Date:                  Expected Discharge Plan:  Home w Hospice Care  In-House Referral:     Discharge planning Services  CM Consult  Post Acute Care Choice:    Choice offered to:  Patient  DME Arranged:    DME Agency:     HH Arranged:  RN HH Agency:  [facility]  Status of Service:  In process, will continue to follow  If discussed at Long Length of Stay Meetings, dates discussed:    Additional Comments:  [name], RN 08/15/2018, 10:36 AM

## 2018-08-15 NOTE — Progress Notes (Addendum)
PROGRESS NOTE                                                                                                                                                                                                             Patient Demographics:    Derrick Oneill, is a 67 y.o. male, DOB - 1951-02-13, XBJ:[id number]  Admit date - 08/11/2018   Admitting Physician Briscoe Deutscher, MD  Outpatient Primary MD for the patient is [facility]  LOS - 3  Outpatient Specialists: None  Chief Complaint  Patient presents with  . Ascites       Brief Narrative   67 year old male with decompensated alcoholic cirrhosis with recurrent ascites who was hospitalized at [facility] frequently requiring paracentesis with attempt to placing percutaneous peritoneal drain for home hospice follow-up and ascites fluid drain at home.  He could not get IR guided percutaneous drain placed on last 3 occasions (the first 2 times they were not enough fluid reaccumulation post large volume paracentesis and recently he was incarcerated). Patient finally got admitted on 9/7 with increasing abdominal distention with shortness of breath and increasing erythema and tenderness of bilateral lower legs. Patient had right lower quadrant peritoneal Pleurx catheter placed by IR on 9/9.  Hospital course prolonged due to development of lower extremity cellulitis.   Subjective:   Patient has persistent swelling and erythema of right inner thigh and left lower leg.   Assessment  & Plan :   Alcoholic liver cirrhosis with refractory ascites After repeated scheduling finally got percutaneous drain placed by IR on 9/9.  Patient had 2 L acetic fluid drained at [facility] on 9/7 and then additional 2 L by radiology on 9/9 before Pleurx catheter was placed.  Fluid analysis negative for SBP. Getting intermittent acetic fluid drained while in the hospital.  He  will be assigned with hosp[facility] [place] him once discharged. Continue IV Lasix (80 mg every 12 hours) given increased lower extremity swelling, continue nadolol and lactulose.  Cellulitis of bilateral lower extremity (R >L) Was receiving Rocephin but symptoms worsening.  Antibiotic escalated to IV vancomycin yesterday.  Patient says his leg swelling and erythema has not improved from yesterday.  Continue leg elevation.  Patient did have similar leg cellulitis when he was hospitalized at any pain about 6 weeks back.  Chronic hyponatremia Asymptomatic.  Associated with decompensated liver cirrhosis.  Acute pulmonary edema Secondary to cirrhosis with anasarca.  Continue IV Lasix and Aldactone.  Having good diuresis but still has lower extremity pitting edema and will need to continue on IV Lasix.  Microcytic anemia  Associated with alcoholism.    Code Status : DNR.  Family Communication  : None at bedside.  Patient lives alone  Disposition Plan  : Home with hospice possibly in the next 1 to 2 days if cellulitis improved  Barriers For Discharge : Active cellulitis  Consults  : IR  Procedures  : Pleurx catheter placement  DVT Prophylaxis  :  Lovenox -   Lab Results  Component Value Date   PLT 121 (L) 08/13/2018    Antibiotics  :   Anti-infectives (From admission, onward)   Start     Dose/Rate Route Frequency Ordered Stop   08/15/18 2200  vancomycin (VANCOCIN) 1,250 mg in sodium chloride 0.9 % 250 mL IVPB     1,250 mg 166.7 mL/hr over 90 Minutes Intravenous Every 24 hours 08/15/18 0940     08/14/18 0900  vancomycin (VANCOCIN) IVPB 1000 mg/200 mL premix  Status:  Discontinued     1,000 mg 200 mL/hr over 60 Minutes Intravenous Every 12 hours 08/14/18 0859 08/15/18 0940   08/13/18 0836  ceFAZolin (ANCEF) 2-4 GM/100ML-% IVPB    Note to Pharmacy:  Lissa Merlin   : cabinet override      08/13/18 0836 08/13/18 0848   08/11/18 2300  cefTRIAXone (ROCEPHIN) 1 g in sodium  chloride 0.9 % 100 mL IVPB  Status:  Discontinued     1 g 200 mL/hr over 30 Minutes Intravenous Every 24 hours 08/11/18 2254 08/14/18 0842        Objective:   Vitals:   08/14/18 0524 08/14/18 1414 08/14/18 2055 08/15/18 0436  BP: 96/66 93/63 (!) 109/55 (!) 91/59  Pulse: 84 74 74 85  Resp: 18  18 20   Temp: 98.1 F (36.7 C) 98.3 F (36.8 C) 98.2 F (36.8 C) 98.4 F (36.9 C)  TempSrc: Oral Oral Oral Oral  SpO2: 95% 93% 97% 93%  Weight:      Height:        Wt Readings from Last 3 Encounters:  08/13/18 84.9 kg  07/31/18 93 kg  07/15/18 93 kg     Intake/Output Summary (Last 24 hours) at 08/15/2018 1438 Last data filed at 08/15/2018 0900 Gross per 24 hour  Intake 900 ml  Output 2425 ml  Net -1525 ml     Physical Exam  Gen: not in distress, fatigued HEENT: Pallor present, moist mucosa, supple neck Chest: clear b/l, no added sounds CVS: N S1&S2, no murmurs, GI: soft, distended with ascites, right Pleurx catheter in place, nontender Musculoskeletal: Warm with bilateral leg swelling (R >L) with pitting edema up to the thigh and erythema over bilateral leg more pronounced over the right inner thigh.  Tender to pressure CNS: Alert and oriented, no tremors    Data Review:    CBC Recent Labs  Lab 08/11/18 1855 08/13/18 1458  WBC 8.6 14.2*  HGB 9.3* 8.9*  HCT 26.8* 25.8*  PLT 150 121*  MCV 101.9* 102.4*  MCH 35.4* 35.3*  MCHC 34.7 34.5  RDW 15.4 14.8  LYMPHSABS 0.4*  --   MONOABS 1.9*  --   EOSABS 0.0  --   BASOSABS 0.0  --     Chemistries  Recent Labs  Lab 08/11/18 1855 08/13/18 1458  08/15/18 0543  NA 128* 125* 123*  K 4.1 3.8 4.4  CL 96* 92* 91*  CO2 30 25 24   GLUCOSE 150* 137* 135*  BUN 10 14 18   CREATININE 0.94 0.97 1.31*  CALCIUM 7.4* 7.4* 7.4*  AST 48*  --   --   ALT 27  --   --   ALKPHOS 153*  --   --   BILITOT 2.6*  --   --     ------------------------------------------------------------------------------------------------------------------ No results for input(s): CHOL, HDL, LDLCALC, TRIG, CHOLHDL, LDLDIRECT in the last 72 hours.  Lab Results  Component Value Date   HGBA1C 5.4 02/29/2016   ------------------------------------------------------------------------------------------------------------------ No results for input(s): TSH, T4TOTAL, T3FREE, THYROIDAB in the last 72 hours.  Invalid input(s): FREET3 ------------------------------------------------------------------------------------------------------------------ No results for input(s): VITAMINB12, FOLATE, FERRITIN, TIBC, IRON, RETICCTPCT in the last 72 hours.  Coagulation profile Recent Labs  Lab 08/11/18 1855 08/13/18 0709  INR 1.49 1.87    No results for input(s): DDIMER in the last 72 hours.  Cardiac Enzymes No results for input(s): CKMB, TROPONINI, MYOGLOBIN in the last 168 hours.  Invalid input(s): CK ------------------------------------------------------------------------------------------------------------------ No results found for: BNP  Inpatient Medications  Scheduled Meds: . Chlorhexidine Gluconate Cloth  6 each Topical Q0600  . cholecalciferol  1,000 Units Oral Daily  . docusate sodium  200 mg Oral BID  . enoxaparin (LOVENOX) injection  40 mg Subcutaneous Q24H  . folic acid  1 mg Oral Daily  . furosemide  80 mg Intravenous Q12H  . lactulose  20 g Oral TID  . multivitamin with minerals  1 tablet Oral Daily  . mupirocin ointment  1 application Nasal BID  . nadolol  20 mg Oral Daily  . pantoprazole  40 mg Oral Daily  . sodium chloride flush  3 mL Intravenous Q12H  . spironolactone  50 mg Oral Daily  . thiamine  100 mg Oral Daily   Continuous Infusions: . sodium chloride    . vancomycin     PRN Meds:.sodium chloride, acetaminophen **OR** acetaminophen, HYDROcodone-acetaminophen, ondansetron **OR** ondansetron (ZOFRAN)  IV, sodium chloride flush  Micro Results Recent Results (from the past 240 hour(s))  Gram stain     Status: None   Collection Time: 08/11/18  8:21 PM  Result Value Ref Range Status   Specimen Description PERITONEAL  Final   Special Requests NONE  Final   Gram Stain   Final    CYTOSPIN SMEAR NO ORGANISMS SEEN WBC PRESENT, PREDOMINANTLY MONONUCLEAR Gram Stain Report Called to,Read Back By and Verified With: TURNER,C. AT 2111 ON 08/11/2018 BY EVA Performed at [facility], [address], [place], [place] [address]    Report Status 08/11/2018 FINAL  Final  Culture, body fluid-bottle     Status: None (Preliminary result)   Collection Time: 08/11/18  8:27 PM  Result Value Ref Range Status   Specimen Description PERITONEAL  Final   Special Requests   Final    BOTTLES DRAWN AEROBIC AND ANAEROBIC Blood Culture adequate volume   Culture   Final    NO GROWTH 4 DAYS Performed at [facility], [address] [place], [place] [address]    Report Status PENDING  Incomplete  MRSA PCR Screening     Status: Abnormal   Collection Time: 08/14/18  4:20 PM  Result Value Ref Range Status   MRSA by PCR POSITIVE (A) NEGATIVE Final    Comment:        The GeneXpert MRSA Assay (FDA approved for NASAL specimens only), is  one component of a comprehensive MRSA colonization surveillance program. It is not intended to diagnose MRSA infection nor to guide or monitor treatment for MRSA infections. RESULT CALLED TO, READ BACK BY AND VERIFIED WITH: B.GRACE,RN AT 1933 BY L.PITT 08/14/18     Radiology Reports Dg Chest 2 View  Result Date: 08/11/2018 CLINICAL DATA:  Shortness of breath. Abdominal ascites from cirrhosis. EXAM: CHEST - 2 VIEW COMPARISON:  06/20/2018 FINDINGS: Low lung volumes with exaggeration of the mediastinal contours. Apparent soft tissue thickening in the right suprahilar region. Tortuosity and calcific atherosclerotic disease of the aorta. Interstitial pulmonary edema. No definite effusion.  Bibasilar atelectasis. Osseous structures are without acute abnormality. Soft tissues are grossly normal. IMPRESSION: Low lung volumes with exaggeration of the mediastinal contours. Apparent soft tissue thickening in the right suprahilar region. Repeated chest radiograph in full inspiration may be considered. Interstitial pulmonary edema. Bibasilar atelectasis. Electronically Signed   By: Ted Mcalpine M.D.   On: 08/11/2018 19:56   Ir Perc Noralee Stain Perit Cath Wo Port  Result Date: 08/13/2018 CLINICAL DATA:  Recurrent ascites secondary to cirrhosis, requiring repeated large volume paracentesis. Tunneled pleural drain catheter requested. See yesterday's consultation. EXAM: TUNNELED PERITONEAL CUFFED DRAIN CATHETER PLACEMENT UNDER ULTRASOUND AND FLUOROSCOPIC GUIDANCE PARACENTESIS FLUOROSCOPY TIME:  0.1 minutes; 30 uGym2 DAP TECHNIQUE: Survey ultrasound of the abdomen was performed and an appropriate skin entry site was selected. As antibiotic prophylaxis, cefazolin 2 g was ordered pre-procedure and administered intravenously within one hour of incision. Skin site was marked, prepped with Betadine, and draped in usual sterile fashion, and infiltrated locally with 1% lidocaine. Intravenous Fentanyl and Versed were administered as conscious sedation during continuous monitoring of the patient's level of consciousness and physiological / cardiorespiratory status by the radiology RN, with a total moderate sedation time of 13 minutes. Under real-time ultrasound guidance, an 18 gauge sheath needle was advanced into the peritoneal space. Ultrasound imaging documentation was saved. Ascites could be aspirated. An Amplatz guidewire advanced through the sheath easily. The PleurX cuffed drain catheter was tunneled from an appropriate skin entry site to the dermatotomy. The sheath was exchanged over the wire for a peel-away sheath, through which the PleurX catheter was advanced into the peritoneal space. Fluoroscopy confirmed  appropriate position of the catheter. The catheter was secured externally with 0 Prolene suture. The dermatotomy was closed with Dermabond. Total of 3.8 liters clear yellow ascites were removed. The patient tolerated the procedure well, with no immediate complication. IMPRESSION: 1. Technically successful tunneled cuffed peritoneal drain catheter placement. 2. Paracentesis removing 3.8 liters ascites. Electronically Signed   By: Corlis Leak M.D.   On: 08/13/2018 09:53   [place] Ascites (abdomen Limited)  Result Date: 07/19/2018 CLINICAL DATA:  Pre PleurX catheter placement evaluation of ascites. EXAM: LIMITED ABDOMEN ULTRASOUND FOR ASCITES TECHNIQUE: Limited ultrasound survey for ascites was performed in all four abdominal quadrants. COMPARISON:  07/18/2018. FINDINGS: Small to moderate amount of ascites in both lower quadrants of the abdomen. IMPRESSION: Small to moderate amount of ascites in the lower quadrants of the abdomen bilaterally. Electronically Signed   By: Beckie Salts M.D.   On: 07/19/2018 11:35   [place] Ascites (abdomen Limited)  Result Date: 07/18/2018 CLINICAL DATA:  Ascites. EXAM: LIMITED ABDOMEN ULTRASOUND FOR ASCITES TECHNIQUE: Limited ultrasound survey for ascites was performed in all four abdominal quadrants. COMPARISON:  None. FINDINGS: Minimal amount of ascites is noted in all 4 quadrants of the abdomen. IMPRESSION: Minimal ascites is noted. Electronically Signed   By: Roque Lias  Montez Hageman, M.D.   On: 07/18/2018 11:39   Ir Paracentesis  Result Date: 07/31/2018 INDICATION: Recurrent ascites EXAM: ULTRASOUND-GUIDED PARACENTESIS COMPARISON:  Previous paracentesis. MEDICATIONS: 10 cc 1% lidocaine. COMPLICATIONS: None immediate. TECHNIQUE: Informed written consent was obtained from the patient after a discussion of the risks, benefits and alternatives to treatment. A timeout was performed prior to the initiation of the procedure. Initial ultrasound scanning demonstrates a large amount of ascites within  the right lower abdominal quadrant. The right lower abdomen was prepped and draped in the usual sterile fashion. 1% lidocaine with epinephrine was used for local anesthesia. Under direct ultrasound guidance, a 19 gauge, 7-cm, Yueh catheter was introduced. An ultrasound image was saved for documentation purposed. The paracentesis was performed. The catheter was removed and a dressing was applied. The patient tolerated the procedure well without immediate post procedural complication. FINDINGS: A total of approximately 2.8 liters of yellow fluid was removed. IMPRESSION: Successful ultrasound-guided paracentesis yielding 2.8 liters of peritoneal fluid. Read by Robet Leu [facility] Electronically Signed   By: Judie Petit.  Shick M.D.   On: 07/31/2018 10:08    Time Spent in minutes  35   Amyrie Illingworth M.D on 08/15/2018 at 2:38 PM  Between 7am to 7pm - Pager - [phone]  After 7pm go to [email or web address] - password [facility]  [facility] -  Office  [phone]

## 2018-08-16 ENCOUNTER — Inpatient Hospital Stay (HOSPITAL_COMMUNITY): Payer: Medicare Other

## 2018-08-16 DIAGNOSIS — L02419 Cutaneous abscess of limb, unspecified: Secondary | ICD-10-CM

## 2018-08-16 LAB — CBC
HCT: 24.8 % — ABNORMAL LOW (ref 39.0–52.0)
HEMOGLOBIN: 8.7 g/dL — AB (ref 13.0–17.0)
MCH: 34.8 pg — ABNORMAL HIGH (ref 26.0–34.0)
MCHC: 35.1 g/dL (ref 30.0–36.0)
MCV: 99.2 fL (ref 78.0–100.0)
PLATELETS: 149 10*3/uL — AB (ref 150–400)
RBC: 2.5 MIL/uL — AB (ref 4.22–5.81)
RDW: 14.7 % (ref 11.5–15.5)
WBC: 15.8 10*3/uL — ABNORMAL HIGH (ref 4.0–10.5)

## 2018-08-16 LAB — PREALBUMIN

## 2018-08-16 LAB — PROTIME-INR
INR: 1.46
PROTHROMBIN TIME: 17.6 s — AB (ref 11.4–15.2)

## 2018-08-16 LAB — BASIC METABOLIC PANEL
ANION GAP: 7 (ref 5–15)
BUN: 19 mg/dL (ref 8–23)
CALCIUM: 7.3 mg/dL — AB (ref 8.9–10.3)
CO2: 24 mmol/L (ref 22–32)
Chloride: 90 mmol/L — ABNORMAL LOW (ref 98–111)
Creatinine, Ser: 1.24 mg/dL (ref 0.61–1.24)
GFR, EST NON AFRICAN AMERICAN: 59 mL/min — AB (ref >60)
Glucose, Bld: 142 mg/dL — ABNORMAL HIGH (ref 70–99)
POTASSIUM: 4.3 mmol/L (ref 3.5–5.1)
Sodium: 121 mmol/L — ABNORMAL LOW (ref 135–145)

## 2018-08-16 LAB — AMMONIA: AMMONIA: 56 umol/L — AB (ref 9–35)

## 2018-08-16 LAB — HEPATIC FUNCTION PANEL
ALBUMIN: 1.6 g/dL — AB (ref 3.5–5.0)
ALT: 17 U/L (ref 0–44)
AST: 30 U/L (ref 15–41)
Alkaline Phosphatase: 148 U/L — ABNORMAL HIGH (ref 38–126)
Bilirubin, Direct: 1.3 mg/dL — ABNORMAL HIGH (ref 0.0–0.2)
Indirect Bilirubin: 1.2 mg/dL — ABNORMAL HIGH (ref 0.3–0.9)
Total Bilirubin: 2.5 mg/dL — ABNORMAL HIGH (ref 0.3–1.2)
Total Protein: 6.8 g/dL (ref 6.5–8.1)

## 2018-08-16 LAB — CULTURE, BODY FLUID W GRAM STAIN -BOTTLE

## 2018-08-16 LAB — CULTURE, BODY FLUID-BOTTLE
CULTURE: NO GROWTH
SPECIAL REQUESTS: ADEQUATE

## 2018-08-16 MED ORDER — SODIUM CHLORIDE 0.9 % IV SOLN
INTRAVENOUS | Status: DC
  Administered 2018-08-16 – 2018-08-18 (×4): via INTRAVENOUS

## 2018-08-16 MED ORDER — VANCOMYCIN HCL IN DEXTROSE 1-5 GM/200ML-% IV SOLN: 1000.0000 mg | Freq: Once | INTRAVENOUS | Status: DC

## 2018-08-16 MED ORDER — ALBUMIN HUMAN 25 % IV SOLN
50.0000 g | Freq: Once | INTRAVENOUS | Status: AC
  Administered 2018-08-16: 50 g via INTRAVENOUS
  Filled 2018-08-16: qty 200

## 2018-08-16 MED ORDER — SODIUM CHLORIDE 0.9 % IV SOLN
1.0000 g | Freq: Two times a day (BID) | INTRAVENOUS | Status: DC
  Administered 2018-08-16 – 2018-08-19 (×7): 1 g via INTRAVENOUS
  Filled 2018-08-16 (×9): qty 1

## 2018-08-16 NOTE — Progress Notes (Signed)
PROGRESS NOTE                                                                                                                                                                                                             Patient Demographics:    Derrick Oneill, is a 67 y.o. male, DOB - Nov 06, 1951, ZOX:[id number]  Admit date - 08/11/2018   Admitting Physician Briscoe Deutscher, MD  Outpatient Primary MD for the patient is [facility]  LOS - 4  Outpatient Specialists: None  Chief Complaint  Patient presents with  . Ascites       Brief Narrative   67 year old male with decompensated alcoholic cirrhosis with recurrent ascites who was hospitalized at [facility] frequently requiring paracentesis with attempt to placing percutaneous peritoneal drain for home hospice follow-up and ascites fluid drain at home.  He could not get IR guided percutaneous drain placed on last 3 occasions (the first 2 times they were not enough fluid reaccumulation post large volume paracentesis and recently he was incarcerated). Patient finally got admitted on 9/7 with increasing abdominal distention with shortness of breath and increasing erythema and tenderness of bilateral lower legs. Patient had right lower quadrant peritoneal Pleurx catheter placed by IR on 9/9.  Hospital course prolonged due to development of lower extremity cellulitis.   Subjective:   C/o increasing pain over right thigh. No fever   Assessment  & Plan :   Alcoholic liver cirrhosis with refractory ascites After repeated scheduling finally got percutaneous drain placed by IR on 9/9.  Patient had 2 L acetic fluid drained at any pain hospital on 9/7 and then additional 2 L by radiology on 9/9 before Pleurx catheter was placed.  Fluid analysis negative for SBP. Getting intermittent acetic fluid drained while in the hospital.  He will be assigned with [facility] of  [place] him once discharged. Continue IV Lasix (80 mg every 12 hours) given increased lower extremity swelling, continue nadolol and lactulose.  Cellulitis of bilateral lower extremity (R >L) Right thigh abscess Worsening on IV Rocephin, escalated to IV vancomycin 2 days back without improvement.  Right inner thigh is more warm and red with induration and small amount of pus expressed on pressure.  Ultrasound of the thigh shows phlegmon with possible developing abscess in the medial right thigh measuring 3.7 x  3.7 x 7.4 cm with extensive soft tissue edema.  Surgery consulted.  Worsened leukocytosis this morning.  Continue empiric vancomycin and added cefepime for broader coverage.  Chronic hyponatremia Asymptomatic.  Associated with decompensated liver cirrhosis.  Acute pulmonary edema Secondary to cirrhosis with anasarca.  Continue IV Lasix and Aldactone.  Having good diuresis but still has lower extremity pitting edema and will need to continue on IV Lasix.  Macrocytic anemia  Associated with alcoholism.    Code Status : DNR.  Family Communication  : None at bedside.  Patient lives alone  Disposition Plan  : Home with hospice once cellulitis and possible abscess improves.  Barriers For Discharge : Active cellulitis  Consults  : IR, general surgery  Procedures  : Pleurx catheter placement  DVT Prophylaxis  :  Lovenox -   Lab Results  Component Value Date   PLT 149 (L) 08/16/2018    Antibiotics  :   Anti-infectives (From admission, onward)   Start     Dose/Rate Route Frequency Ordered Stop   08/16/18 1000  ceFEPIme (MAXIPIME) 1 g in sodium chloride 0.9 % 100 mL IVPB     1 g 200 mL/hr over 30 Minutes Intravenous Every 12 hours 08/16/18 0901     08/15/18 2200  vancomycin (VANCOCIN) 1,250 mg in sodium chloride 0.9 % 250 mL IVPB     1,250 mg 166.7 mL/hr over 90 Minutes Intravenous Every 24 hours 08/15/18 0940     08/14/18 0900  vancomycin (VANCOCIN) IVPB 1000 mg/200 mL  premix  Status:  Discontinued     1,000 mg 200 mL/hr over 60 Minutes Intravenous Every 12 hours 08/14/18 0859 08/15/18 0940   08/13/18 0836  ceFAZolin (ANCEF) 2-4 GM/100ML-% IVPB    Note to Pharmacy:  Lissa Merlin   : cabinet override      08/13/18 0836 08/13/18 0848   08/11/18 2300  cefTRIAXone (ROCEPHIN) 1 g in sodium chloride 0.9 % 100 mL IVPB  Status:  Discontinued     1 g 200 mL/hr over 30 Minutes Intravenous Every 24 hours 08/11/18 2254 08/14/18 0842        Objective:   Vitals:   08/15/18 2145 08/16/18 0500 08/16/18 0546 08/16/18 0548  BP: (!) 85/55  (!) 87/65 91/66  Pulse: 74  63 65  Resp: 18  16   Temp: 98.3 F (36.8 C)  98.6 F (37 C)   TempSrc: Oral  Oral   SpO2: 96%  93% 95%  Weight:  84.6 kg    Height:        Wt Readings from Last 3 Encounters:  08/16/18 84.6 kg  07/31/18 93 kg  07/15/18 93 kg     Intake/Output Summary (Last 24 hours) at 08/16/2018 1103 Last data filed at 08/16/2018 0750 Gross per 24 hour  Intake 593 ml  Output 4075 ml  Net -3482 ml   Physical exam General: Fatigued  HEENT: Pallor present, moist mucosa, supple neck Chest: Clear bilaterally CVs: Normal S1-S2, no murmurs GI: Soft, abdominal distention with ascites, right Pleurx catheter in place, nontender Musculoskeletal: No increased erythema with induration over the right inner thigh, tender with a small amount of pus on pressure, significant erythema of the right lower leg with some pitting edema bilaterally      Data Review:    CBC Recent Labs  Lab 08/11/18 1855 08/13/18 1458 08/16/18 0454  WBC 8.6 14.2* 15.8*  HGB 9.3* 8.9* 8.7*  HCT 26.8* 25.8* 24.8*  PLT 150 121* 149*  MCV 101.9* 102.4* 99.2  MCH 35.4* 35.3* 34.8*  MCHC 34.7 34.5 35.1  RDW 15.4 14.8 14.7  LYMPHSABS 0.4*  --   --   MONOABS 1.9*  --   --   EOSABS 0.0  --   --   BASOSABS 0.0  --   --     Chemistries  Recent Labs  Lab 08/11/18 1855 08/13/18 1458 08/15/18 0543 08/16/18 0454  NA 128* 125*  123* 121*  K 4.1 3.8 4.4 4.3  CL 96* 92* 91* 90*  CO2 30 25 24 24   GLUCOSE 150* 137* 135* 142*  BUN 10 14 18 19   CREATININE 0.94 0.97 1.31* 1.24  CALCIUM 7.4* 7.4* 7.4* 7.3*  AST 48*  --   --   --   ALT 27  --   --   --   ALKPHOS 153*  --   --   --   BILITOT 2.6*  --   --   --    ------------------------------------------------------------------------------------------------------------------ No results for input(s): CHOL, HDL, LDLCALC, TRIG, CHOLHDL, LDLDIRECT in the last 72 hours.  Lab Results  Component Value Date   HGBA1C 5.4 02/29/2016   ------------------------------------------------------------------------------------------------------------------ No results for input(s): TSH, T4TOTAL, T3FREE, THYROIDAB in the last 72 hours.  Invalid input(s): FREET3 ------------------------------------------------------------------------------------------------------------------ No results for input(s): VITAMINB12, FOLATE, FERRITIN, TIBC, IRON, RETICCTPCT in the last 72 hours.  Coagulation profile Recent Labs  Lab 08/11/18 1855 08/13/18 0709  INR 1.49 1.87    No results for input(s): DDIMER in the last 72 hours.  Cardiac Enzymes No results for input(s): CKMB, TROPONINI, MYOGLOBIN in the last 168 hours.  Invalid input(s): CK ------------------------------------------------------------------------------------------------------------------ No results found for: BNP  Inpatient Medications  Scheduled Meds: . Chlorhexidine Gluconate Cloth  6 each Topical Q0600  . cholecalciferol  1,000 Units Oral Daily  . docusate sodium  200 mg Oral BID  . enoxaparin (LOVENOX) injection  40 mg Subcutaneous Q24H  . folic acid  1 mg Oral Daily  . furosemide  80 mg Intravenous Q12H  . lactulose  20 g Oral TID  . multivitamin with minerals  1 tablet Oral Daily  . mupirocin ointment  1 application Nasal BID  . nadolol  20 mg Oral Daily  . pantoprazole  40 mg Oral Daily  . sodium chloride flush   3 mL Intravenous Q12H  . spironolactone  50 mg Oral Daily  . thiamine  100 mg Oral Daily   Continuous Infusions: . sodium chloride 10 mL/hr at 08/15/18 2021  . ceFEPime (MAXIPIME) IV    . vancomycin 1,250 mg (08/15/18 2201)   PRN Meds:.sodium chloride, acetaminophen **OR** acetaminophen, HYDROcodone-acetaminophen, ondansetron **OR** ondansetron (ZOFRAN) IV, sodium chloride flush  Micro Results Recent Results (from the past 240 hour(s))  Gram stain     Status: None   Collection Time: 08/11/18  8:21 PM  Result Value Ref Range Status   Specimen Description PERITONEAL  Final   Special Requests NONE  Final   Gram Stain   Final    CYTOSPIN SMEAR NO ORGANISMS SEEN WBC PRESENT, PREDOMINANTLY MONONUCLEAR Gram Stain Report Called to,Read Back By and Verified With: TURNER,C. AT 2111 ON 08/11/2018 BY EVA Performed at [facility], [address], [place], [place] [address]    Report Status 08/11/2018 FINAL  Final  Culture, body fluid-bottle     Status: None (Preliminary result)   Collection Time: 08/11/18  8:27 PM  Result Value Ref Range Status   Specimen Description PERITONEAL  Final   Special Requests  Final    BOTTLES DRAWN AEROBIC AND ANAEROBIC Blood Culture adequate volume   Culture   Final    NO GROWTH 4 DAYS Performed at [facility], [address] [place], [place] [address]    Report Status PENDING  Incomplete  MRSA PCR Screening     Status: Abnormal   Collection Time: 08/14/18  4:20 PM  Result Value Ref Range Status   MRSA by PCR POSITIVE (A) NEGATIVE Final    Comment:        The GeneXpert MRSA Assay (FDA approved for NASAL specimens only), is one component of a comprehensive MRSA colonization surveillance program. It is not intended to diagnose MRSA infection nor to guide or monitor treatment for MRSA infections. RESULT CALLED TO, READ BACK BY AND VERIFIED WITH: B.GRACE,RN AT 1933 BY L.PITT 08/14/18     Radiology Reports Dg Chest 2 View  Result Date:  08/11/2018 CLINICAL DATA:  Shortness of breath. Abdominal ascites from cirrhosis. EXAM: CHEST - 2 VIEW COMPARISON:  06/20/2018 FINDINGS: Low lung volumes with exaggeration of the mediastinal contours. Apparent soft tissue thickening in the right suprahilar region. Tortuosity and calcific atherosclerotic disease of the aorta. Interstitial pulmonary edema. No definite effusion. Bibasilar atelectasis. Osseous structures are without acute abnormality. Soft tissues are grossly normal. IMPRESSION: Low lung volumes with exaggeration of the mediastinal contours. Apparent soft tissue thickening in the right suprahilar region. Repeated chest radiograph in full inspiration may be considered. Interstitial pulmonary edema. Bibasilar atelectasis. Electronically Signed   By: Ted Mcalpineobrinka  Dimitrova M.D.   On: 08/11/2018 19:56   Ir Perc Noralee Stainun Perit Cath Wo Port  Result Date: 08/13/2018 CLINICAL DATA:  Recurrent ascites secondary to cirrhosis, requiring repeated large volume paracentesis. Tunneled pleural drain catheter requested. See yesterday's consultation. EXAM: TUNNELED PERITONEAL CUFFED DRAIN CATHETER PLACEMENT UNDER ULTRASOUND AND FLUOROSCOPIC GUIDANCE PARACENTESIS FLUOROSCOPY TIME:  0.1 minutes; 30 uGym2 DAP TECHNIQUE: Survey ultrasound of the abdomen was performed and an appropriate skin entry site was selected. As antibiotic prophylaxis, cefazolin 2 g was ordered pre-procedure and administered intravenously within one hour of incision. Skin site was marked, prepped with Betadine, and draped in usual sterile fashion, and infiltrated locally with 1% lidocaine. Intravenous Fentanyl and Versed were administered as conscious sedation during continuous monitoring of the patient's level of consciousness and physiological / cardiorespiratory status by the radiology RN, with a total moderate sedation time of 13 minutes. Under real-time ultrasound guidance, an 18 gauge sheath needle was advanced into the peritoneal space. Ultrasound  imaging documentation was saved. Ascites could be aspirated. An Amplatz guidewire advanced through the sheath easily. The PleurX cuffed drain catheter was tunneled from an appropriate skin entry site to the dermatotomy. The sheath was exchanged over the wire for a peel-away sheath, through which the PleurX catheter was advanced into the peritoneal space. Fluoroscopy confirmed appropriate position of the catheter. The catheter was secured externally with 0 Prolene suture. The dermatotomy was closed with Dermabond. Total of 3.8 liters clear yellow ascites were removed. The patient tolerated the procedure well, with no immediate complication. IMPRESSION: 1. Technically successful tunneled cuffed peritoneal drain catheter placement. 2. Paracentesis removing 3.8 liters ascites. Electronically Signed   By: Corlis Leak  Hassell M.D.   On: 08/13/2018 09:53   [place] Rt Lower Extrem Ltd Soft Tissue Non Vascular  Result Date: 08/16/2018 CLINICAL DATA:  Cellulitis right medial thigh region with pain EXAM: ULTRASOUND RIGHT THIGH TECHNIQUE: Longitudinal and transverse images of the right thigh region were obtained. COMPARISON:  None. FINDINGS: There is diffuse  soft tissue edema throughout the right medial thigh region. There is a complex irregular fluid collection measuring 3.7 x 3.7 x 7.4 cm. No other masslike area in this region. No adenopathy. No tendon or ligamentous assessment made. IMPRESSION: Phlegmon with probable developing abscess in the medial right thigh region measuring 3.7 x 3.7 x 7.4 cm. Extensive surrounding soft tissue edema. No other lesion evident in this area. Electronically Signed   By: Bretta Bang III M.D.   On: 08/16/2018 10:35   [place] Ascites (abdomen Limited)  Result Date: 07/19/2018 CLINICAL DATA:  Pre PleurX catheter placement evaluation of ascites. EXAM: LIMITED ABDOMEN ULTRASOUND FOR ASCITES TECHNIQUE: Limited ultrasound survey for ascites was performed in all four abdominal quadrants. COMPARISON:   07/18/2018. FINDINGS: Small to moderate amount of ascites in both lower quadrants of the abdomen. IMPRESSION: Small to moderate amount of ascites in the lower quadrants of the abdomen bilaterally. Electronically Signed   By: Beckie Salts M.D.   On: 07/19/2018 11:35   [place] Ascites (abdomen Limited)  Result Date: 07/18/2018 CLINICAL DATA:  Ascites. EXAM: LIMITED ABDOMEN ULTRASOUND FOR ASCITES TECHNIQUE: Limited ultrasound survey for ascites was performed in all four abdominal quadrants. COMPARISON:  None. FINDINGS: Minimal amount of ascites is noted in all 4 quadrants of the abdomen. IMPRESSION: Minimal ascites is noted. Electronically Signed   By: Lupita Raider, M.D.   On: 07/18/2018 11:39   Ir Paracentesis  Result Date: 07/31/2018 INDICATION: Recurrent ascites EXAM: ULTRASOUND-GUIDED PARACENTESIS COMPARISON:  Previous paracentesis. MEDICATIONS: 10 cc 1% lidocaine. COMPLICATIONS: None immediate. TECHNIQUE: Informed written consent was obtained from the patient after a discussion of the risks, benefits and alternatives to treatment. A timeout was performed prior to the initiation of the procedure. Initial ultrasound scanning demonstrates a large amount of ascites within the right lower abdominal quadrant. The right lower abdomen was prepped and draped in the usual sterile fashion. 1% lidocaine with epinephrine was used for local anesthesia. Under direct ultrasound guidance, a 19 gauge, 7-cm, Yueh catheter was introduced. An ultrasound image was saved for documentation purposed. The paracentesis was performed. The catheter was removed and a dressing was applied. The patient tolerated the procedure well without immediate post procedural complication. FINDINGS: A total of approximately 2.8 liters of yellow fluid was removed. IMPRESSION: Successful ultrasound-guided paracentesis yielding 2.8 liters of peritoneal fluid. Read by Robet Leu [facility] Electronically Signed   By: Judie Petit.  Shick M.D.   On: 07/31/2018 10:08     Time Spent in minutes  35   Robertson Colclough M.D on 08/16/2018 at 11:03 AM  Between 7am to 7pm - Pager - [phone]  After 7pm go to [email or web address] - password [facility]  [facility] -  Office  [phone]

## 2018-08-16 NOTE — Care Management Note (Signed)
Case Management Note  Patient Details  Name: Derrick RossBarry K Oneill MRN: [id number] Date of Birth: 07-12-1951  Subjective/Objective:                    Action/Plan:  Patient not discharging today . [facility] [place] [phone] aware. Expected Discharge Date:                  Expected Discharge Plan:  Home w Hospice Care  In-House Referral:     Discharge planning Services  CM Consult  Post Acute Care Choice:    Choice offered to:  Patient  DME Arranged:    DME Agency:     HH Arranged:  RN [facility]  Status of Service:  In process, will continue to follow  If discussed at Long Length of Stay Meetings, dates discussed:    Additional Comments:  [name], Areanna Gengler Marie, RN 08/16/2018, 10:45 AM

## 2018-08-16 NOTE — Consult Note (Signed)
Reason for Consult:thigh cellulitis Referring Physician: N Dhungel Chief complaint: Increasing ascites with shortness of breath  Derrick Oneill is an 67 y.o. male.  HPI: Patient is a 67 year old male who was seen at any [facility] on 08/11/2018 with increased abdominal swelling.  He has a history of alcoholic cirrhosis he had been scheduled for a Pleurx catheter placement to drain his ascites but this was delayed because he was in jail.  He complained of some chills and increased redness and swelling in his legs at that time also.  He is incontinent, voiding on himself while he was in the ED.  He refused to allow anyone to clean him up.  There was some concerns that there was an element of encephalopathy present.  Some redness on his legs and reported chills after some form of accident with a lawnmower.  He had bilateral lower extremity edema with serous weeping and a secondary cellulitis.  He underwent a paracentesis and 1.5L  of fluid was removed.  Saturations remained at 90% on 2 L of nasal cannula.,  His chest x-ray was notable for interstitial edema with chronic hyponatremia and hyperbilirubinemia.  He also had an INR of 1.49.  He was admitted.  He was transferred to [facility] and subsequently underwent percutaneous placement of a peritoneal catheter by interventional radiology and Dr. Arne Cleveland, on 08/13/2018.  Since that time he is continued to have progression of his cellulitis the right lower extremity is worse than the left.  He was initially treated with Rocephin, and Ancef.  On 08/14/2018 he was converted to vancomycin.  Which was from the peritoneal fluid so far been negative.  Nasal swab was positive for MRSA.  There are no cultures of the thighs.  He is undergoing ultrasound of his soft tissues lower extremities today.  We are asked to see in consultation.  Patient is confused and rambles on.  He is not really sure of anything.  Getting additional history from him was not  possible.   Right thigh cellulitis   Past Medical History:  Diagnosis Date  .  Alcoholic cirrhosis   . Hypertension   . Psoriasis   . Renal insufficiency 05/29/2018    Past Surgical History:  Procedure Laterality Date  . HERNIA REPAIR    . IR PARACENTESIS  07/31/2018  . IR PERC TUN PERIT CATH WO PORT S&I Dartha Lodge  08/13/2018  . PARACENTESIS    . UMBILICAL HERNIA REPAIR      Family History  Problem Relation Age of Onset  . Cirrhosis Father        deceased age 21, etoh related    Social History:  reports that he has been smoking cigars. He has quit using smokeless tobacco.  His smokeless tobacco use included snuff and chew. He reports that he drinks alcohol. He reports that he does not use drugs.  Allergies: No Known Allergies  Medications:  Prior to Admission:  Medications Prior to Admission  Medication Sig Dispense Refill Last Dose  . cholecalciferol (VITAMIN D) 1000 units tablet Take 1,000 Units by mouth daily.   08/10/2018 at Unknown time  . clobetasol cream (TEMOVATE) 0.96 % Apply 1 application topically 2 (two) times daily as needed (Apply a thin layer as needed for psoriasis).   Past Month at Unknown time  . docusate sodium (COLACE) 100 MG capsule Take 200 mg by mouth 2 (two) times daily.    08/10/2018 at Unknown time  . ferrous sulfate 325 (65 FE) MG  tablet Take 325 mg by mouth daily with breakfast.   08/10/2018 at Unknown time  . folic acid (FOLVITE) 1 MG tablet Take 1 mg by mouth daily.   08/10/2018 at Unknown time  . lactulose (CHRONULAC) 10 GM/15ML solution Take 20 g by mouth 3 (three) times daily.    08/10/2018 at Unknown time  . Multiple Vitamin (MULTIVITAMIN WITH MINERALS) TABS tablet Take 1 tablet by mouth daily.   08/10/2018 at Unknown time  . nadolol (CORGARD) 20 MG tablet Take 1 tablet (20 mg total) by mouth daily. 30 tablet 1 08/10/2018 at 0830  . potassium chloride SA (K-DUR,KLOR-CON) 20 MEQ tablet Take 20 mEq by mouth 2 (two) times daily.   08/10/2018 at Unknown time  .  spironolactone (ALDACTONE) 100 MG tablet Take 1 tablet (100 mg total) by mouth daily. 30 tablet 3 08/10/2018 at Unknown time  . thiamine 100 MG tablet Take 100 mg by mouth daily.   08/10/2018 at Unknown time  . torsemide (DEMADEX) 20 MG tablet Take 1 tablet (20 mg total) by mouth daily. 30 tablet 1 08/10/2018 at Unknown time  . Vitamin D, Ergocalciferol, (DRISDOL) 50000 units CAPS capsule Take 50,000 Units by mouth every 7 (seven) days.   08/04/2018   Scheduled: . Chlorhexidine Gluconate Cloth  6 each Topical Q0600  . cholecalciferol  1,000 Units Oral Daily  . docusate sodium  200 mg Oral BID  . enoxaparin (LOVENOX) injection  40 mg Subcutaneous Q24H  . folic acid  1 mg Oral Daily  . furosemide  80 mg Intravenous Q12H  . lactulose  20 g Oral TID  . multivitamin with minerals  1 tablet Oral Daily  . mupirocin ointment  1 application Nasal BID  . nadolol  20 mg Oral Daily  . pantoprazole  40 mg Oral Daily  . sodium chloride flush  3 mL Intravenous Q12H  . spironolactone  50 mg Oral Daily  . thiamine  100 mg Oral Daily   Continuous: . sodium chloride 10 mL/hr at 08/15/18 2021  . ceFEPime (MAXIPIME) IV    . vancomycin 1,250 mg (08/15/18 2201)   Anti-infectives (From admission, onward)   Start     Dose/Rate Route Frequency Ordered Stop   08/16/18 1000  ceFEPIme (MAXIPIME) 1 g in sodium chloride 0.9 % 100 mL IVPB     1 g 200 mL/hr over 30 Minutes Intravenous Every 12 hours 08/16/18 0901     08/15/18 2200  vancomycin (VANCOCIN) 1,250 mg in sodium chloride 0.9 % 250 mL IVPB     1,250 mg 166.7 mL/hr over 90 Minutes Intravenous Every 24 hours 08/15/18 0940     08/14/18 0900  vancomycin (VANCOCIN) IVPB 1000 mg/200 mL premix  Status:  Discontinued     1,000 mg 200 mL/hr over 60 Minutes Intravenous Every 12 hours 08/14/18 0859 08/15/18 0940   08/13/18 0836  ceFAZolin (ANCEF) 2-4 GM/100ML-% IVPB    Note to Pharmacy:  Manuela Neptune   : cabinet override      08/13/18 0836 08/13/18 0848   08/11/18  2300  cefTRIAXone (ROCEPHIN) 1 g in sodium chloride 0.9 % 100 mL IVPB  Status:  Discontinued     1 g 200 mL/hr over 30 Minutes Intravenous Every 24 hours 08/11/18 2254 08/14/18 0842      Results for orders placed or performed during the hospital encounter of 08/11/18 (from the past 48 hour(s))  MRSA PCR Screening     Status: Abnormal   Collection Time: 08/14/18  4:20  PM  Result Value Ref Range   MRSA by PCR POSITIVE (A) NEGATIVE    Comment:        The GeneXpert MRSA Assay (FDA approved for NASAL specimens only), is one component of a comprehensive MRSA colonization surveillance program. It is not intended to diagnose MRSA infection nor to guide or monitor treatment for MRSA infections. RESULT CALLED TO, READ BACK BY AND VERIFIED WITH: B.GRACE,RN AT 1933 BY L.PITT 6/96/29   Basic metabolic panel     Status: Abnormal   Collection Time: 08/15/18  5:43 AM  Result Value Ref Range   Sodium 123 (L) 135 - 145 mmol/L   Potassium 4.4 3.5 - 5.1 mmol/L   Chloride 91 (L) 98 - 111 mmol/L   CO2 24 22 - 32 mmol/L   Glucose, Bld 135 (H) 70 - 99 mg/dL   BUN 18 8 - 23 mg/dL   Creatinine, Ser 1.31 (H) 0.61 - 1.24 mg/dL   Calcium 7.4 (L) 8.9 - 10.3 mg/dL   GFR calc non Af Amer 56 (L) >60 mL/min   GFR calc Af Amer >60 >60 mL/min    Comment: (NOTE) The eGFR has been calculated using the CKD EPI equation. This calculation has not been validated in all clinical situations. eGFR's persistently <60 mL/min signify possible Chronic Kidney Disease.    Anion gap 8 5 - 15    Comment: Performed at [facility] [address] [place], [place] [address]  Basic metabolic panel     Status: Abnormal   Collection Time: 08/16/18  4:54 AM  Result Value Ref Range   Sodium 121 (L) 135 - 145 mmol/L   Potassium 4.3 3.5 - 5.1 mmol/L   Chloride 90 (L) 98 - 111 mmol/L   CO2 24 22 - 32 mmol/L   Glucose, Bld 142 (H) 70 - 99 mg/dL   BUN 19 8 - 23 mg/dL   Creatinine, Ser 1.24 0.61 - 1.24 mg/dL   Calcium  7.3 (L) 8.9 - 10.3 mg/dL   GFR calc non Af Amer 59 (L) >60 mL/min   GFR calc Af Amer >60 >60 mL/min    Comment: (NOTE) The eGFR has been calculated using the CKD EPI equation. This calculation has not been validated in all clinical situations. eGFR's persistently <60 mL/min signify possible Chronic Kidney Disease.    Anion gap 7 5 - 15    Comment: Performed at [facility] [address] [place], [place] [address]  CBC     Status: Abnormal   Collection Time: 08/16/18  4:54 AM  Result Value Ref Range   WBC 15.8 (H) 4.0 - 10.5 K/uL   RBC 2.50 (L) 4.22 - 5.81 MIL/uL   Hemoglobin 8.7 (L) 13.0 - 17.0 g/dL   HCT 24.8 (L) 39.0 - 52.0 %   MCV 99.2 78.0 - 100.0 fL   MCH 34.8 (H) 26.0 - 34.0 pg   MCHC 35.1 30.0 - 36.0 g/dL   RDW 14.7 11.5 - 15.5 %   Platelets 149 (L) 150 - 400 K/uL    Comment: Performed at [facility], [place] [address], [place], [place] [address]    No results found.  Review of Systems  Unable to perform ROS: Dementia  Constitutional:       Pt thinks it's 1999, somewhat confused, and rambles on.  Skin:       Bilateral lower leg edema +2 on the left +3 on the right.  Marked erythema right upper  thigh   Blood pressure 91/66, pulse 65, temperature 98.6 F (37 C), temperature source Oral, resp. rate 16, height '5\' 8"'$  (1.727 m), weight 84.6 kg, SpO2 95 %. Physical Exam  Constitutional: He appears well-developed.  Patient's awake and alert and talking in bed.  He is confused and his discussion is rambling.  He has marked ascites.  He is also incontinent of urine.  HENT:  Head: Normocephalic and atraumatic.  Mouth/Throat: No oropharyngeal exudate.  Eyes: Right eye exhibits no discharge. Left eye exhibits no discharge. No scleral icterus.  Pupils are equal  Neck: Normal range of motion. Neck supple. JVD present. No tracheal deviation present. No thyromegaly present.  Cardiovascular: Normal rate, regular rhythm, normal heart sounds and intact distal pulses.    Respiratory: Effort normal. No respiratory distress. He has no wheezes. He has rales. He exhibits no tenderness.  GI: He exhibits distension. He exhibits no mass. There is no tenderness. There is no rebound and no guarding.  Abdomen is markedly distended with ascites.  He has a new Pleurx catheter in the right lower quadrant.  Musculoskeletal: He exhibits edema and tenderness.  Lymphadenopathy:    He has no cervical adenopathy.  Neurological: He is alert. No cranial nerve deficit.  Patient is confused  Skin:  He has marked cellulitis and swelling of the right thigh.  As pictured above he has an abscess that is starting to have purulent pustules appearing.  He has marked cellulitis going down his thigh and less severe cellulitis in his lower extremity.  He has +2 edema in the left lower extremity and +3 edema in the right lower extremity.  Skin and lower extremity is also dry and scaling.  Psychiatric:  Patient with probable alcoholic encephalopathy is somewhat confused and discussion is rambling.   . sodium chloride 10 mL/hr at 08/15/18 2021  . ceFEPime (MAXIPIME) IV 1 g (08/16/18 1113)  . vancomycin 1,250 mg (08/15/18 2201)    Assessment/Plan: Alcoholic cirrhosis with refractory ascites  Placements of a right lower quadrant Pleurx catheter 08/13/2018 Dr. 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   FEN:  Regular diet;  I just made him NPO ID: Rocephin 9/7 through 9/10.  Vancomycin 9/11 =>> day 2; Zosyn  9/12; till we have cultures  Plan: I think this needs to be drained.  His last INR was 1.87.  He has had breakfast and has recently eaten. He still has crumbs and food in the bed with him.  The ultrasound shows what we can see on the surface which includes a phlegmon with probable developing abscess right medial thigh measuring  3.7 x 3.7 x 7.4 cm with extensive surrounding soft tissue edema.  I have ordered LFTs, INR, HIV, blood cultures, and Zosyn.  I made have him n.p.o., will await studies and discuss timing with Dr. Ninfa Linden.    Jany Buckwalter 08/16/2018, 10:19 AM

## 2018-08-16 NOTE — Progress Notes (Signed)
Pharmacy Antibiotic Note  Derrick Oneill is a 67 y.o. male admitted on 08/11/2018 with cellulitis.  Pharmacy has been consulted for Vancomycin and Cefepime dosing. Today is Day #3 of Vancomycin and total abx Day #6. Adding Cefepime as cellulitis not resolving  WBC up to 15.8. Afeb.   Scr trending back down to 1.25 (was 1.31 yesterday), baseline ~1. UOP good.  Antimicrobials this admission: 9/7 Ceftriaxone >>9/10 9/10 Vanc >>  9/12 Cefepime>>  Dose adjustments this admission: N/A  Microbiology results: 9/7 Peritoneal washings >>ngtd 9/10 MRSA PCR: positive  Plan: Continue Vancomycin at 1250mg  IV q24h Cefepime 1gm IV q12h Will f/u micro data, renal function, and pt's clinical condition BMET at least every 72 hours while on Vanc -plan for next one 9/14 Vanc trough at [facility]    Height: 5\' 8"  (172.7 cm) Weight: 186 lb 8.2 oz (84.6 kg) IBW/kg (Calculated) : 68.4  Temp (24hrs), Avg:98.5 F (36.9 C), Min:98.3 F (36.8 C), Max:98.6 F (37 C)  Recent Labs  Lab 08/11/18 1855 08/13/18 1458 08/15/18 0543 08/16/18 0454  WBC 8.6 14.2*  --  15.8*  CREATININE 0.94 0.97 1.31* 1.24  LATICACIDVEN 2.3*  --   --   --     Estimated Creatinine Clearance: 62.9 mL/min (by C-G formula based on SCr of 1.24 mg/dL).    No Known Allergies    Thank you for allowing pharmacy to be a part of this patient's care.  Christoper Fabianaron Keyira Mondesir, PharmD, BCPS Clinical pharmacist  **Pharmacist phone directory can now be found on [email or web address] ([email or web address]).  Listed under [facility]. 08/16/2018 8:57 AM

## 2018-08-17 ENCOUNTER — Encounter (HOSPITAL_COMMUNITY): Payer: Self-pay | Admitting: Certified Registered Nurse Anesthetist

## 2018-08-17 ENCOUNTER — Inpatient Hospital Stay (HOSPITAL_COMMUNITY): Payer: Medicare Other | Admitting: Certified Registered Nurse Anesthetist

## 2018-08-17 ENCOUNTER — Encounter (HOSPITAL_COMMUNITY)
Admission: EM | Disposition: A | Discharge status: Home / Self Care | Payer: Self-pay | Source: Home / Self Care | Attending: Internal Medicine

## 2018-08-17 DIAGNOSIS — L02415 Cutaneous abscess of right lower limb: Secondary | ICD-10-CM

## 2018-08-17 HISTORY — PX: INCISION AND DRAINAGE ABSCESS: SHX5864

## 2018-08-17 LAB — CBC
HEMATOCRIT: 24.2 % — AB (ref 39.0–52.0)
Hemoglobin: 8.4 g/dL — ABNORMAL LOW (ref 13.0–17.0)
MCH: 34.7 pg — ABNORMAL HIGH (ref 26.0–34.0)
MCHC: 34.7 g/dL (ref 30.0–36.0)
MCV: 100 fL (ref 78.0–100.0)
Platelets: 144 10*3/uL — ABNORMAL LOW (ref 150–400)
RBC: 2.42 MIL/uL — ABNORMAL LOW (ref 4.22–5.81)
RDW: 15.1 % (ref 11.5–15.5)
WBC: 9.7 10*3/uL (ref 4.0–10.5)

## 2018-08-17 LAB — BASIC METABOLIC PANEL
ANION GAP: 5 (ref 5–15)
BUN: 17 mg/dL (ref 8–23)
CALCIUM: 7.4 mg/dL — AB (ref 8.9–10.3)
CO2: 26 mmol/L (ref 22–32)
Chloride: 96 mmol/L — ABNORMAL LOW (ref 98–111)
Creatinine, Ser: 1.02 mg/dL (ref 0.61–1.24)
Glucose, Bld: 113 mg/dL — ABNORMAL HIGH (ref 70–99)
Potassium: 3.6 mmol/L (ref 3.5–5.1)
Sodium: 127 mmol/L — ABNORMAL LOW (ref 135–145)

## 2018-08-17 LAB — HIV ANTIBODY (ROUTINE TESTING W REFLEX): HIV SCREEN 4TH GENERATION: NONREACTIVE

## 2018-08-17 SURGERY — INCISION AND DRAINAGE, ABSCESS
Anesthesia: General | Laterality: Right

## 2018-08-17 MED ORDER — FENTANYL CITRATE (PF) 250 MCG/5ML IJ SOLN
INTRAMUSCULAR | Status: AC
  Filled 2018-08-17: qty 5

## 2018-08-17 MED ORDER — FENTANYL CITRATE (PF) 250 MCG/5ML IJ SOLN
INTRAMUSCULAR | Status: DC | PRN
  Administered 2018-08-17: 100 ug via INTRAVENOUS

## 2018-08-17 MED ORDER — HYDROMORPHONE HCL 1 MG/ML IJ SOLN: 0.2500 mg | INTRAMUSCULAR | Status: DC | PRN

## 2018-08-17 MED ORDER — ONDANSETRON HCL 4 MG/2ML IJ SOLN
INTRAMUSCULAR | Status: AC
  Filled 2018-08-17: qty 2

## 2018-08-17 MED ORDER — OXYCODONE HCL 5 MG PO TABS: 5.0000 mg | ORAL_TABLET | Freq: Once | ORAL | Status: DC | PRN

## 2018-08-17 MED ORDER — OXYCODONE HCL 5 MG/5ML PO SOLN: 5.0000 mg | Freq: Once | ORAL | Status: DC | PRN

## 2018-08-17 MED ORDER — EPHEDRINE 5 MG/ML INJ
INTRAVENOUS | Status: AC
  Filled 2018-08-17: qty 10

## 2018-08-17 MED ORDER — MEPERIDINE HCL 50 MG/ML IJ SOLN: 6.2500 mg | INTRAMUSCULAR | Status: DC | PRN

## 2018-08-17 MED ORDER — PROPOFOL 10 MG/ML IV BOLUS
INTRAVENOUS | Status: AC
  Filled 2018-08-17: qty 20

## 2018-08-17 MED ORDER — ONDANSETRON HCL 4 MG/2ML IJ SOLN
INTRAMUSCULAR | Status: DC | PRN
  Administered 2018-08-17: 4 mg via INTRAVENOUS

## 2018-08-17 MED ORDER — BUPIVACAINE-EPINEPHRINE 0.25% -1:200000 IJ SOLN
INTRAMUSCULAR | Status: DC | PRN
  Administered 2018-08-17: 20 mL

## 2018-08-17 MED ORDER — PROPOFOL 10 MG/ML IV BOLUS
INTRAVENOUS | Status: DC | PRN
  Administered 2018-08-17: 130 mg via INTRAVENOUS

## 2018-08-17 MED ORDER — BUPIVACAINE-EPINEPHRINE (PF) 0.25% -1:200000 IJ SOLN
INTRAMUSCULAR | Status: AC
  Filled 2018-08-17: qty 30

## 2018-08-17 MED ORDER — LIDOCAINE 2% (20 MG/ML) 5 ML SYRINGE
INTRAMUSCULAR | Status: DC | PRN
  Administered 2018-08-17: 80 mg via INTRAVENOUS

## 2018-08-17 MED ORDER — PHENYLEPHRINE 40 MCG/ML (10ML) SYRINGE FOR IV PUSH (FOR BLOOD PRESSURE SUPPORT)
PREFILLED_SYRINGE | INTRAVENOUS | Status: DC | PRN
  Administered 2018-08-17: 80 ug via INTRAVENOUS

## 2018-08-17 MED ORDER — MIDAZOLAM HCL 2 MG/2ML IJ SOLN
INTRAMUSCULAR | Status: AC
  Filled 2018-08-17: qty 2

## 2018-08-17 MED ORDER — PROMETHAZINE HCL 25 MG/ML IJ SOLN: 6.2500 mg | INTRAMUSCULAR | Status: DC | PRN

## 2018-08-17 MED ORDER — MIDAZOLAM HCL 5 MG/5ML IJ SOLN
INTRAMUSCULAR | Status: DC | PRN
  Administered 2018-08-17: 2 mg via INTRAVENOUS

## 2018-08-17 MED ORDER — SUCCINYLCHOLINE CHLORIDE 200 MG/10ML IV SOSY
PREFILLED_SYRINGE | INTRAVENOUS | Status: DC | PRN
  Administered 2018-08-17: 100 mg via INTRAVENOUS

## 2018-08-17 SURGICAL SUPPLY — 28 items
BNDG GAUZE ELAST 4 BULKY (GAUZE/BANDAGES/DRESSINGS) ×2 IMPLANT
CANISTER SUCT 3000ML PPV (MISCELLANEOUS) ×3 IMPLANT
COVER SURGICAL LIGHT HANDLE (MISCELLANEOUS) ×3 IMPLANT
DRAPE LAPAROSCOPIC ABDOMINAL (DRAPES) ×3 IMPLANT
DRAPE UTILITY XL STRL (DRAPES) ×6 IMPLANT
DRSG PAD ABDOMINAL 8X10 ST (GAUZE/BANDAGES/DRESSINGS) IMPLANT
ELECT CAUTERY BLADE 6.4 (BLADE) ×3 IMPLANT
ELECT REM PT RETURN 9FT ADLT (ELECTROSURGICAL) ×3
ELECTRODE REM PT RTRN 9FT ADLT (ELECTROSURGICAL) ×1 IMPLANT
GAUZE SPONGE 4X4 12PLY STRL (GAUZE/BANDAGES/DRESSINGS) ×2 IMPLANT
GLOVE SURG SIGNA 7.5 PF LTX (GLOVE) ×3 IMPLANT
GOWN STRL REUS W/ TWL LRG LVL3 (GOWN DISPOSABLE) ×1 IMPLANT
GOWN STRL REUS W/ TWL XL LVL3 (GOWN DISPOSABLE) ×1 IMPLANT
GOWN STRL REUS W/TWL LRG LVL3 (GOWN DISPOSABLE) ×6
GOWN STRL REUS W/TWL XL LVL3 (GOWN DISPOSABLE) ×3
KIT BASIN OR (CUSTOM PROCEDURE TRAY) ×3 IMPLANT
KIT TURNOVER KIT B (KITS) ×3 IMPLANT
NS IRRIG 1000ML POUR BTL (IV SOLUTION) ×3 IMPLANT
PACK GENERAL/GYN (CUSTOM PROCEDURE TRAY) ×3 IMPLANT
PAD ABD 8X10 STRL (GAUZE/BANDAGES/DRESSINGS) ×2 IMPLANT
PAD ARMBOARD 7.5X6 YLW CONV (MISCELLANEOUS) ×6 IMPLANT
PENCIL SMOKE EVACUATOR (MISCELLANEOUS) ×2 IMPLANT
SPECIMEN JAR SMALL (MISCELLANEOUS) IMPLANT
SWAB COLLECTION DEVICE MRSA (MISCELLANEOUS) IMPLANT
SWAB CULTURE ESWAB REG 1ML (MISCELLANEOUS) ×2 IMPLANT
TAPE CLOTH SURG 4X10 WHT LF (GAUZE/BANDAGES/DRESSINGS) ×2 IMPLANT
TOWEL OR 17X24 6PK STRL BLUE (TOWEL DISPOSABLE) ×3 IMPLANT
TOWEL OR 17X26 10 PK STRL BLUE (TOWEL DISPOSABLE) ×1 IMPLANT

## 2018-08-17 NOTE — Progress Notes (Signed)
Patient ID: Derrick Oneill, male   DOB: 1951-11-05, 67 y.o.   MRN: [id number] Pre Procedure note for inpatients:   Derrick Oneill has been scheduled for Procedure(s): INCISION AND DRAINAGE RIGHT LEG ABSCESS (Right) today. The various methods of treatment have been discussed with the patient. After consideration of the risks, benefits and treatment options the patient has consented to the planned procedure.   The patient has been seen and labs reviewed. There are no changes in the patient's condition to prevent proceeding with the planned procedure today.  Recent labs:  Lab Results  Component Value Date   WBC 15.8 (H) 08/16/2018   HGB 8.7 (L) 08/16/2018   HCT 24.8 (L) 08/16/2018   PLT 149 (L) 08/16/2018   GLUCOSE 142 (H) 08/16/2018   ALT 17 08/16/2018   AST 30 08/16/2018   NA 121 (L) 08/16/2018   K 4.3 08/16/2018   CL 90 (L) 08/16/2018   CREATININE 1.24 08/16/2018   BUN 19 08/16/2018   CO2 24 08/16/2018   INR 1.46 08/16/2018   HGBA1C 5.4 02/29/2016    Rhonin Trott A, MD 08/17/2018 8:11 AM

## 2018-08-17 NOTE — Progress Notes (Addendum)
PROGRESS NOTE                                                                                                                                                                                                             Patient Demographics:    Derrick Oneill, is a 67 y.o. male, DOB - 1951/02/03, WJX:[id number]  Admit date - 08/11/2018   Admitting Physician Briscoe Deutscher, MD  Outpatient Primary MD for the patient is [facility]  LOS - 5  Outpatient Specialists: None  Chief Complaint  Patient presents with  . Ascites       Brief Narrative   67 year old male with decompensated alcoholic cirrhosis with recurrent ascites who was hospitalized at [facility] frequently requiring paracentesis with attempt to placing percutaneous peritoneal drain for home hospice follow-up and ascites fluid drain at home.  He could not get IR guided percutaneous drain placed on last 3 occasions (the first 2 times they were not enough fluid reaccumulation post large volume paracentesis and recently he was incarcerated). Patient finally got admitted on 9/7 with increasing abdominal distention with shortness of breath and increasing erythema and tenderness of bilateral lower legs. Patient had right lower quadrant peritoneal Pleurx catheter placed by IR on 9/9.  Hospital course prolonged due to development of lower extremity cellulitis and abscess.   Subjective:   After returning from the OR.  Reports pain in his right thigh to be much better.   Assessment  & Plan :   Alcoholic liver cirrhosis with refractory ascites After repeated scheduling finally got percutaneous drain placed by IR on 9/9.  Patient had 2 L acetic fluid drained at any pain hospital on 9/7 and then additional 2 L by radiology on 9/9 before Pleurx catheter was placed.  Fluid analysis negative for SBP.   Getting  fluid drained intermittently while in the hospital.   He will be assigned with hosp[facility] [place] [facility] once discharged. Continue current dose of IV Lasix 80 mg every 12 hours given increased lower extremity swelling, continue nadolol and lactulose.  Diuresing quite well. No signs of encephalopathy.  Cellulitis of bilateral lower extremity (R >L) Right thigh abscess Symptoms worsened on IV Rocephin, antibiotic escalated to IV vancomycin  without improvement.    Ultrasound of the thigh shows phlegmon with possible developing abscess in the  medial right thigh measuring 3.7 x 3.7 x 7.4 cm with extensive soft tissue edema.  Surgery consulted and patient taken to the OR today for I&D of the right thigh abscess.  Or necrotic tissue.  Cultures sent. Continue clean dressing.  Hypotension Systolic blood pressure in mid 80s today.  Monitor while on IV Lasix and Aldactone.  Chronic hyponatremia Worsened yesterday, improved to 127 in a.m. lab.  Likely hypervolemic hyponatremia with decompensated cirrhosis.  Continue current dose Lasix.  Acute pulmonary edema Secondary to cirrhosis with anasarca.  Continue IV Lasix and Aldactone.  Still has lower extremity edema although slowly improving and diuresing well.  Macrocytic anemia  Associated with alcoholism.    Code Status : DNR.  Family Communication  : None at bedside.  Patient lives alone  Disposition Plan  : Home with hospice possibly early next week if symptoms improved  Barriers For Discharge : Right thigh abscess  Consults  : IR, general surgery  Procedures  : Pleurx catheter placement for I&D of right thigh abscess  DVT Prophylaxis  :  Lovenox -   Lab Results  Component Value Date   PLT 144 (L) 08/17/2018    Antibiotics  :   Anti-infectives (From admission, onward)   Start     Dose/Rate Route Frequency Ordered Stop   08/16/18 1115  vancomycin (VANCOCIN) IVPB 1000 mg/200 mL premix  Status:  Discontinued     1,000 mg 200 mL/hr over 60 Minutes Intravenous  Once 08/16/18 1104  08/16/18 1104   08/16/18 1000  ceFEPIme (MAXIPIME) 1 g in sodium chloride 0.9 % 100 mL IVPB     1 g 200 mL/hr over 30 Minutes Intravenous Every 12 hours 08/16/18 0901     08/15/18 2200  vancomycin (VANCOCIN) 1,250 mg in sodium chloride 0.9 % 250 mL IVPB     1,250 mg 166.7 mL/hr over 90 Minutes Intravenous Every 24 hours 08/15/18 0940     08/14/18 0900  vancomycin (VANCOCIN) IVPB 1000 mg/200 mL premix  Status:  Discontinued     1,000 mg 200 mL/hr over 60 Minutes Intravenous Every 12 hours 08/14/18 0859 08/15/18 0940   08/13/18 0836  ceFAZolin (ANCEF) 2-4 GM/100ML-% IVPB    Note to Pharmacy:  Lissa Merlin   : cabinet override      08/13/18 0836 08/13/18 0848   08/11/18 2300  cefTRIAXone (ROCEPHIN) 1 g in sodium chloride 0.9 % 100 mL IVPB  Status:  Discontinued     1 g 200 mL/hr over 30 Minutes Intravenous Every 24 hours 08/11/18 2254 08/14/18 0842        Objective:   Vitals:   08/17/18 1041 08/17/18 1045 08/17/18 1100 08/17/18 1133  BP: (!) 86/51 (!) 87/59  (!) 83/58  Pulse:  63 64 67  Resp:  15 15 18   Temp:   97.8 F (36.6 C) (!) 97.4 F (36.3 C)  TempSrc:    Oral  SpO2: (!) 89% 96% 97% 94%  Weight:      Height:        Wt Readings from Last 3 Encounters:  08/17/18 81.3 kg  07/31/18 93 kg  07/15/18 93 kg     Intake/Output Summary (Last 24 hours) at 08/17/2018 1458 Last data filed at 08/17/2018 1100 Gross per 24 hour  Intake 2175.56 ml  Output 3580 ml  Net -1404.44 ml   Physical exam Elderly male appears fatigued not in distress HEENT: Moist mucosa, supple neck Chest: Clear bilaterally CVs: Normal S1 and S2, no murmurs  GI: Soft, abdominal distention with ascites, right Pleurx catheter in place nontender Musculoskeletal: Dressing over right inner thigh following I&D, minimal tenderness, erythema and swelling over the legs have improved from yesterday.      Data Review:    CBC Recent Labs  Lab 08/11/18 1855 08/13/18 1458 08/16/18 0454 08/17/18 1206    WBC 8.6 14.2* 15.8* 9.7  HGB 9.3* 8.9* 8.7* 8.4*  HCT 26.8* 25.8* 24.8* 24.2*  PLT 150 121* 149* 144*  MCV 101.9* 102.4* 99.2 100.0  MCH 35.4* 35.3* 34.8* 34.7*  MCHC 34.7 34.5 35.1 34.7  RDW 15.4 14.8 14.7 15.1  LYMPHSABS 0.4*  --   --   --   MONOABS 1.9*  --   --   --   EOSABS 0.0  --   --   --   BASOSABS 0.0  --   --   --     Chemistries  Recent Labs  Lab 08/11/18 1855 08/13/18 1458 08/15/18 0543 08/16/18 0454 08/16/18 1117 08/17/18 1206  NA 128* 125* 123* 121*  --  127*  K 4.1 3.8 4.4 4.3  --  3.6  CL 96* 92* 91* 90*  --  96*  CO2 30 25 24 24   --  26  GLUCOSE 150* 137* 135* 142*  --  113*  BUN 10 14 18 19   --  17  CREATININE 0.94 0.97 1.31* 1.24  --  1.02  CALCIUM 7.4* 7.4* 7.4* 7.3*  --  7.4*  AST 48*  --   --   --  30  --   ALT 27  --   --   --  17  --   ALKPHOS 153*  --   --   --  148*  --   BILITOT 2.6*  --   --   --  2.5*  --    ------------------------------------------------------------------------------------------------------------------ No results for input(s): CHOL, HDL, LDLCALC, TRIG, CHOLHDL, LDLDIRECT in the last 72 hours.  Lab Results  Component Value Date   HGBA1C 5.4 02/29/2016   ------------------------------------------------------------------------------------------------------------------ No results for input(s): TSH, T4TOTAL, T3FREE, THYROIDAB in the last 72 hours.  Invalid input(s): FREET3 ------------------------------------------------------------------------------------------------------------------ No results for input(s): VITAMINB12, FOLATE, FERRITIN, TIBC, IRON, RETICCTPCT in the last 72 hours.  Coagulation profile Recent Labs  Lab 08/11/18 1855 08/13/18 0709 08/16/18 1117  INR 1.49 1.87 1.46    No results for input(s): DDIMER in the last 72 hours.  Cardiac Enzymes No results for input(s): CKMB, TROPONINI, MYOGLOBIN in the last 168 hours.  Invalid input(s):  CK ------------------------------------------------------------------------------------------------------------------ No results found for: BNP  Inpatient Medications  Scheduled Meds: . Chlorhexidine Gluconate Cloth  6 each Topical Q0600  . cholecalciferol  1,000 Units Oral Daily  . docusate sodium  200 mg Oral BID  . enoxaparin (LOVENOX) injection  40 mg Subcutaneous Q24H  . folic acid  1 mg Oral Daily  . furosemide  80 mg Intravenous Q12H  . lactulose  20 g Oral TID  . multivitamin with minerals  1 tablet Oral Daily  . mupirocin ointment  1 application Nasal BID  . nadolol  20 mg Oral Daily  . pantoprazole  40 mg Oral Daily  . sodium chloride flush  3 mL Intravenous Q12H  . spironolactone  50 mg Oral Daily  . thiamine  100 mg Oral Daily   Continuous Infusions: . sodium chloride 10 mL/hr at 08/17/18 0907  . sodium chloride 75 mL/hr at 08/17/18 1350  . ceFEPime (MAXIPIME) IV Stopped (08/16/18 2257)  .  vancomycin Stopped (08/17/18 0041)   PRN Meds:.sodium chloride, acetaminophen **OR** acetaminophen, HYDROcodone-acetaminophen, ondansetron **OR** ondansetron (ZOFRAN) IV, sodium chloride flush  Micro Results Recent Results (from the past 240 hour(s))  Gram stain     Status: None   Collection Time: 08/11/18  8:21 PM  Result Value Ref Range Status   Specimen Description PERITONEAL  Final   Special Requests NONE  Final   Gram Stain   Final    CYTOSPIN SMEAR NO ORGANISMS SEEN WBC PRESENT, PREDOMINANTLY MONONUCLEAR Gram Stain Report Called to,Read Back By and Verified With: TURNER,C. AT 2111 ON 08/11/2018 BY EVA Performed at [facility], [address], [place], [place] [address]    Report Status 08/11/2018 FINAL  Final  Culture, body fluid-bottle     Status: None   Collection Time: 08/11/18  8:27 PM  Result Value Ref Range Status   Specimen Description PERITONEAL  Final   Special Requests   Final    BOTTLES DRAWN AEROBIC AND ANAEROBIC Blood Culture adequate volume   Culture    Final    NO GROWTH 5 DAYS Performed at [facility], [address], [place], [place] [address]    Report Status 08/16/2018 FINAL  Final  MRSA PCR Screening     Status: Abnormal   Collection Time: 08/14/18  4:20 PM  Result Value Ref Range Status   MRSA by PCR POSITIVE (A) NEGATIVE Final    Comment:        The GeneXpert MRSA Assay (FDA approved for NASAL specimens only), is one component of a comprehensive MRSA colonization surveillance program. It is not intended to diagnose MRSA infection nor to guide or monitor treatment for MRSA infections. RESULT CALLED TO, READ BACK BY AND VERIFIED WITH: B.GRACE,RN AT 1933 BY L.PITT 08/14/18   Culture, blood (routine x 2)     Status: None (Preliminary result)   Collection Time: 08/16/18 11:17 AM  Result Value Ref Range Status   Specimen Description BLOOD LEFT HAND  Final   Special Requests   Final    BOTTLES DRAWN AEROBIC AND ANAEROBIC Blood Culture adequate volume   Culture   Final    NO GROWTH 1 DAY Performed at [facility], [address] [place], [place] [address]    Report Status PENDING  Incomplete  Culture, blood (routine x 2)     Status: None (Preliminary result)   Collection Time: 08/16/18 12:07 PM  Result Value Ref Range Status   Specimen Description BLOOD LEFT ANTECUBITAL  Final   Special Requests   Final    BOTTLES DRAWN AEROBIC AND ANAEROBIC Blood Culture results may not be optimal due to an excessive volume of blood received in culture bottles   Culture   Final    NO GROWTH 1 DAY Performed at [facility], [address] [place], [place] [address]    Report Status PENDING  Incomplete  Aerobic/Anaerobic Culture (surgical/deep wound)     Status: None (Preliminary result)   Collection Time: 08/17/18  9:30 AM  Result Value Ref Range Status   Specimen Description ABSCESS THIGH RIGHT  Final   Special Requests NONE  Final   Gram Stain   Final    RARE WBC PRESENT, PREDOMINANTLY PMN RARE GRAM POSITIVE  COCCI Performed at [facility], [address], [place], [place] [address]    Culture PENDING  Incomplete   Report Status PENDING  Incomplete    Radiology Reports Dg Chest 2 View  Result Date: 08/11/2018  CLINICAL DATA:  Shortness of breath. Abdominal ascites from cirrhosis. EXAM: CHEST - 2 VIEW COMPARISON:  06/20/2018 FINDINGS: Low lung volumes with exaggeration of the mediastinal contours. Apparent soft tissue thickening in the right suprahilar region. Tortuosity and calcific atherosclerotic disease of the aorta. Interstitial pulmonary edema. No definite effusion. Bibasilar atelectasis. Osseous structures are without acute abnormality. Soft tissues are grossly normal. IMPRESSION: Low lung volumes with exaggeration of the mediastinal contours. Apparent soft tissue thickening in the right suprahilar region. Repeated chest radiograph in full inspiration may be considered. Interstitial pulmonary edema. Bibasilar atelectasis. Electronically Signed   By: Ted Mcalpine M.D.   On: 08/11/2018 19:56   Ir Perc Noralee Stain Perit Cath Wo Port  Result Date: 08/13/2018 CLINICAL DATA:  Recurrent ascites secondary to cirrhosis, requiring repeated large volume paracentesis. Tunneled pleural drain catheter requested. See yesterday's consultation. EXAM: TUNNELED PERITONEAL CUFFED DRAIN CATHETER PLACEMENT UNDER ULTRASOUND AND FLUOROSCOPIC GUIDANCE PARACENTESIS FLUOROSCOPY TIME:  0.1 minutes; 30 uGym2 DAP TECHNIQUE: Survey ultrasound of the abdomen was performed and an appropriate skin entry site was selected. As antibiotic prophylaxis, cefazolin 2 g was ordered pre-procedure and administered intravenously within one hour of incision. Skin site was marked, prepped with Betadine, and draped in usual sterile fashion, and infiltrated locally with 1% lidocaine. Intravenous Fentanyl and Versed were administered as conscious sedation during continuous monitoring of the patient's level of consciousness and physiological /  cardiorespiratory status by the radiology RN, with a total moderate sedation time of 13 minutes. Under real-time ultrasound guidance, an 18 gauge sheath needle was advanced into the peritoneal space. Ultrasound imaging documentation was saved. Ascites could be aspirated. An Amplatz guidewire advanced through the sheath easily. The PleurX cuffed drain catheter was tunneled from an appropriate skin entry site to the dermatotomy. The sheath was exchanged over the wire for a peel-away sheath, through which the PleurX catheter was advanced into the peritoneal space. Fluoroscopy confirmed appropriate position of the catheter. The catheter was secured externally with 0 Prolene suture. The dermatotomy was closed with Dermabond. Total of 3.8 liters clear yellow ascites were removed. The patient tolerated the procedure well, with no immediate complication. IMPRESSION: 1. Technically successful tunneled cuffed peritoneal drain catheter placement. 2. Paracentesis removing 3.8 liters ascites. Electronically Signed   By: Corlis Leak M.D.   On: 08/13/2018 09:53   [place] Rt Lower Extrem Ltd Soft Tissue Non Vascular  Result Date: 08/16/2018 CLINICAL DATA:  Cellulitis right medial thigh region with pain EXAM: ULTRASOUND RIGHT THIGH TECHNIQUE: Longitudinal and transverse images of the right thigh region were obtained. COMPARISON:  None. FINDINGS: There is diffuse soft tissue edema throughout the right medial thigh region. There is a complex irregular fluid collection measuring 3.7 x 3.7 x 7.4 cm. No other masslike area in this region. No adenopathy. No tendon or ligamentous assessment made. IMPRESSION: Phlegmon with probable developing abscess in the medial right thigh region measuring 3.7 x 3.7 x 7.4 cm. Extensive surrounding soft tissue edema. No other lesion evident in this area. Electronically Signed   By: Bretta Bang III M.D.   On: 08/16/2018 10:35   [place] Ascites (abdomen Limited)  Result Date: 07/19/2018 CLINICAL DATA:   Pre PleurX catheter placement evaluation of ascites. EXAM: LIMITED ABDOMEN ULTRASOUND FOR ASCITES TECHNIQUE: Limited ultrasound survey for ascites was performed in all four abdominal quadrants. COMPARISON:  07/18/2018. FINDINGS: Small to moderate amount of ascites in both lower quadrants of the abdomen. IMPRESSION: Small to moderate amount of ascites in the lower quadrants of the abdomen bilaterally.  Electronically Signed   By: Beckie Salts M.D.   On: 07/19/2018 11:35   Ir Paracentesis  Result Date: 07/31/2018 INDICATION: Recurrent ascites EXAM: ULTRASOUND-GUIDED PARACENTESIS COMPARISON:  Previous paracentesis. MEDICATIONS: 10 cc 1% lidocaine. COMPLICATIONS: None immediate. TECHNIQUE: Informed written consent was obtained from the patient after a discussion of the risks, benefits and alternatives to treatment. A timeout was performed prior to the initiation of the procedure. Initial ultrasound scanning demonstrates a large amount of ascites within the right lower abdominal quadrant. The right lower abdomen was prepped and draped in the usual sterile fashion. 1% lidocaine with epinephrine was used for local anesthesia. Under direct ultrasound guidance, a 19 gauge, 7-cm, Yueh catheter was introduced. An ultrasound image was saved for documentation purposed. The paracentesis was performed. The catheter was removed and a dressing was applied. The patient tolerated the procedure well without immediate post procedural complication. FINDINGS: A total of approximately 2.8 liters of yellow fluid was removed. IMPRESSION: Successful ultrasound-guided paracentesis yielding 2.8 liters of peritoneal fluid. Read by Robet Leu [facility] Electronically Signed   By: Judie Petit.  Shick M.D.   On: 07/31/2018 10:08    Time Spent in minutes  35   Bettyjane Shenoy M.D on 08/17/2018 at 2:58 PM  Between 7am to 7pm - Pager - [phone]  After 7pm go to [email or web address] - password [facility]  [facility] -  Office  [phone]

## 2018-08-17 NOTE — Progress Notes (Signed)
Patient back to unit at this time.  Right groin site with moderate shadowing.  Will continue to monitor.

## 2018-08-17 NOTE — Anesthesia Postprocedure Evaluation (Signed)
Anesthesia Post Note  Patient: Derrick ParadiseBarry K Ebron  Procedure(s) Performed: INCISION AND DRAINAGE RIGHT LEG ABSCESS (Right )     Patient location during evaluation: PACU Anesthesia Type: General Level of consciousness: awake and alert Pain management: pain level controlled Vital Signs Assessment: post-procedure vital signs reviewed and stable Respiratory status: spontaneous breathing, nonlabored ventilation and respiratory function stable Cardiovascular status: blood pressure returned to baseline and stable Postop Assessment: no apparent nausea or vomiting Anesthetic complications: no    Last Vitals:  Vitals:   08/17/18 1045 08/17/18 1100  BP: (!) 87/59   Pulse: 63 64  Resp: 15 15  Temp:  36.6 C  SpO2: 96% 97%    Last Pain:  Vitals:   08/17/18 1100  TempSrc:   PainSc: 0-No pain                 Lowella CurbWarren Ray Miller

## 2018-08-17 NOTE — Progress Notes (Signed)
Notified Dr. [name] of patient's HR and blood pressure.  Received verbal order to hold beta blocker prior to OR.

## 2018-08-17 NOTE — Anesthesia Preprocedure Evaluation (Addendum)
Anesthesia Evaluation  Patient identified by MRN, date of birth, ID band Patient awake    Reviewed: Allergy & Precautions, NPO status , Patient's Chart, lab work & pertinent test results  Airway Mallampati: II  TM Distance: >3 FB Neck ROM: Full    Dental no notable dental hx.    Pulmonary neg pulmonary ROS, Current Smoker,    Pulmonary exam normal breath sounds clear to auscultation       Cardiovascular hypertension, Pt. on medications negative cardio ROS Normal cardiovascular exam Rhythm:Regular Rate:Normal     Neuro/Psych negative neurological ROS  negative psych ROS   GI/Hepatic negative GI ROS, Neg liver ROS,   Endo/Other  negative endocrine ROS  Renal/GU negative Renal ROS  negative genitourinary   Musculoskeletal negative musculoskeletal ROS (+)   Abdominal   Peds negative pediatric ROS (+)  Hematology negative hematology ROS (+) anemia ,   Anesthesia Other Findings   Reproductive/Obstetrics negative OB ROS                             Anesthesia Physical Anesthesia Plan  ASA: IV  Anesthesia Plan: General   Post-op Pain Management:    Induction: Intravenous, Rapid sequence and Cricoid pressure planned  PONV Risk Score and Plan: 1 and Ondansetron  Airway Management Planned: Oral ETT  Additional Equipment:   Intra-op Plan:   Post-operative Plan: Extubation in OR  Informed Consent: I have reviewed the patients History and Physical, chart, labs and discussed the procedure including the risks, benefits and alternatives for the proposed anesthesia with the patient or authorized representative who has indicated his/her understanding and acceptance.   Dental advisory given  Plan Discussed with: CRNA  Anesthesia Plan Comments:      Anesthesia Quick Evaluation

## 2018-08-17 NOTE — Transfer of Care (Signed)
Immediate Anesthesia Transfer of Care Note  Patient: Derrick RossBarry K Oneill  Procedure(s) Performed: INCISION AND DRAINAGE RIGHT LEG ABSCESS (Right )  Patient Location: PACU  Anesthesia Type:General  Level of Consciousness: drowsy and patient cooperative  Airway & Oxygen Therapy: Patient Spontanous Breathing and non-rebreather face mask  Post-op Assessment: Report given to RN, Post -op Vital signs reviewed and stable and Patient moving all extremities X 4  Post vital signs: Reviewed and stable  Last Vitals:  Vitals Value Taken Time  BP 81/60 08/17/2018 10:06 AM  Temp    Pulse 66 08/17/2018 10:13 AM  Resp 19 08/17/2018 10:13 AM  SpO2 90 % 08/17/2018 10:13 AM  Vitals shown include unvalidated device data.  Last Pain:  Vitals:   08/17/18 0536  TempSrc: Oral  PainSc:       Patients Stated Pain Goal: 3 (08/12/18 [id number])  Complications: No apparent anesthesia complications

## 2018-08-17 NOTE — Anesthesia Procedure Notes (Signed)
Procedure Name: Intubation Date/Time: 08/17/2018 9:19 AM Performed by: Waynard EdwardsSmith, Holden Draughon A, CRNA Pre-anesthesia Checklist: Emergency Drugs available, Patient identified, Suction available and Patient being monitored Patient Re-evaluated:Patient Re-evaluated prior to induction Oxygen Delivery Method: Circle system utilized Preoxygenation: Pre-oxygenation with 100% oxygen Induction Type: IV induction, Rapid sequence and Cricoid Pressure applied Laryngoscope Size: Miller and 2 Grade View: Grade I Tube type: Oral Tube size: 7.5 mm Number of attempts: 1 Airway Equipment and Method: Stylet Placement Confirmation: ETT inserted through vocal cords under direct vision,  positive ETCO2 and breath sounds checked- equal and bilateral Secured at: 22 cm Tube secured with: Tape Dental Injury: Teeth and Oropharynx as per pre-operative assessment

## 2018-08-17 NOTE — Progress Notes (Signed)
Patient taken down to OR at this time by transport for procedure.

## 2018-08-17 NOTE — Op Note (Signed)
INCISION AND DRAINAGE RIGHT LEG ABSCESS  Procedure Note  Thresa RossBarry K Jarboe 08/17/2018   Pre-op Diagnosis: right let abscess     Post-op Diagnosis: same  Procedure(s): INCISION AND DRAINAGE RIGHT LEG ABSCESS  Surgeon(s): Abigail MiyamotoBlackman, Jailyne Chieffo, MD  Anesthesia: General  Staff:  Circulator: Edyth GunnelsMoore, Nicholas B, RN Scrub Person: Orbie HurstMcGranahan, Jamie N, RN  Estimated Blood Loss: Minimal               Cultures sent  Procedure: The patient was brought to the operating room and identified as correct patient.  He is placed upon the operating room table general anesthesia was induced.  His right thigh was then prepped and draped in usual sterile fashion.  I made Oneill longitudinal incision over the obvious abscess and and then took an ellipse of skin with her multiple open draining areas.  I entered Oneill large abscess cavity and an extensive amount of purulence was released.  Cultures were obtained.  There was no evidence of necrotizing fasciitis or necrotic tissue.  I thoroughly irrigated the abscess cavity with saline.  I achieved hemostasis with the cautery.  I anesthetized it circumferentially with Marcaine.  I then packed it with Oneill wet-to-dry saline soaked Kerlix.  Dry gauze and tape were placed over this.  The patient tolerated the procedure well.  All the counts were correct at the end of the procedure.  The patient was then extubated in the operating room and taken in Oneill stable condition to the recovery room.          Derrick Oneill   Date: 08/17/2018  Time: 9:37 AM

## 2018-08-18 ENCOUNTER — Encounter (HOSPITAL_COMMUNITY): Payer: Self-pay | Admitting: Surgery

## 2018-08-18 LAB — BASIC METABOLIC PANEL
Anion gap: 7 (ref 5–15)
BUN: 15 mg/dL (ref 8–23)
CO2: 26 mmol/L (ref 22–32)
Calcium: 7.7 mg/dL — ABNORMAL LOW (ref 8.9–10.3)
Chloride: 94 mmol/L — ABNORMAL LOW (ref 98–111)
Creatinine, Ser: 0.92 mg/dL (ref 0.61–1.24)
GFR calc Af Amer: >60 mL/min (ref >60)
GFR calc non Af Amer: >60 mL/min (ref >60)
GLUCOSE: 107 mg/dL — AB (ref 70–99)
POTASSIUM: 3.7 mmol/L (ref 3.5–5.1)
SODIUM: 127 mmol/L — AB (ref 135–145)

## 2018-08-18 LAB — VANCOMYCIN, TROUGH: Vancomycin Tr: 17 ug/mL (ref 15–20)

## 2018-08-18 MED ORDER — FUROSEMIDE 40 MG PO TABS
40.0000 mg | ORAL_TABLET | Freq: Two times a day (BID) | ORAL | Status: DC
  Administered 2018-08-18 – 2018-08-20 (×5): 40 mg via ORAL
  Filled 2018-08-18 (×5): qty 1

## 2018-08-18 NOTE — Plan of Care (Signed)

## 2018-08-18 NOTE — Progress Notes (Signed)
PROGRESS NOTE                                                                                                                                                                                                             Patient Demographics:    Derrick Oneill, is a 67 y.o. male, DOB - 1950-12-09, ZOX:[id number]  Admit date - 08/11/2018   Admitting Physician Briscoe Deutscher, MD  Outpatient Primary MD for the patient is [facility]  LOS - 6  Outpatient Specialists: None  Chief Complaint  Patient presents with  . Ascites       Brief Narrative   67 year old male with decompensated alcoholic cirrhosis with recurrent ascites who was hospitalized at [facility] frequently requiring paracentesis with attempt to placing percutaneous peritoneal drain for home hospice follow-up and ascites fluid drain at home.  He could not get IR guided percutaneous drain placed on last 3 occasions (the first 2 times they were not enough fluid reaccumulation post large volume paracentesis and recently he was incarcerated). Patient finally got admitted on 9/7 with increasing abdominal distention with shortness of breath and increasing erythema and tenderness of bilateral lower legs. Patient had right lower quadrant peritoneal Pleurx catheter placed by IR on 9/9.  Hospital course prolonged due to development of lower extremity cellulitis and abscess.   Subjective:   Patient reports feeling much better with pain over the right thigh has significantly improved.  Had significant soaking from the wound area for which padding was applied.   Assessment  & Plan :   Alcoholic liver cirrhosis with refractory ascites After repeated scheduling finally got percutaneous drain placed by IR on 9/9.  Patient had 2 L acetic fluid drained at any pain hospital on 9/7 and then additional 2 L by radiology on 9/9 before Pleurx catheter was placed.  Fluid  analysis negative for SBP.   Getting  fluid drained intermittently while in the hospital.  He will be assigned with [facility] him once discharged. Reduce Lasix to 40 mg p.o. twice daily.  Continue nadolol and Aldactone. No signs of encephalopathy.  Cellulitis of bilateral lower extremity (R >L) Right thigh abscess Symptoms worsened on IV Rocephin, antibiotic escalated to IV vancomycin  without improvement.    Ultrasound of the thigh shows phlegmon with possible developing abscess in the  medial right thigh measuring 3.7 x 3.7 x 7.4 cm with extensive soft tissue edema.  Surgery consulted and taken to the OR for I&D of the right thigh abscess.  No necrotizing fasciitis noted. Wound culture growing GPC.  Continue empiric vancomycin and cefepime. Continue clean dressing.  Further recommendations per surgery.  Hypotension IV fluids given due to systolic blood pressure in the 80s.  Will reduce Lasix dose to 40 mg twice daily p.o. (was on IV 80 mg twice daily).  Continue current dose of Aldactone and nadolol.  DC IV fluids if blood pressure remains stable.  Chronic hyponatremia Hypervolemic hyponatremia with decompensated cirrhosis.  Sodium of 127.  Acute pulmonary edema Secondary to cirrhosis with anasarca.  Good diuresis with IV Lasix and Aldactone.  Transition Lasix to p.o.  Symptoms resolved.  Continue I/O.  Macrocytic anemia  Associated with alcoholism.    Code Status : DNR.  Family Communication  : None at bedside.  Patient lives alone  Disposition Plan  : Patient may need home health RN for wound dressing for few days.  Will discuss with care management.  He may benefit from being discharged home with home health initially and then be followed by [facility]. Possible discharge on 9/16.  Barriers For Discharge : Right thigh abscess, postoperative.  Consults  : IR, general surgery  Procedures  : Pleurx catheter placement ; I&D of right thigh abscess  DVT  Prophylaxis  :  Lovenox -   Lab Results  Component Value Date   PLT 144 (L) 08/17/2018    Antibiotics  :   Anti-infectives (From admission, onward)   Start     Dose/Rate Route Frequency Ordered Stop   08/16/18 1115  vancomycin (VANCOCIN) IVPB 1000 mg/200 mL premix  Status:  Discontinued     1,000 mg 200 mL/hr over 60 Minutes Intravenous  Once 08/16/18 1104 08/16/18 1104   08/16/18 1000  ceFEPIme (MAXIPIME) 1 g in sodium chloride 0.9 % 100 mL IVPB     1 g 200 mL/hr over 30 Minutes Intravenous Every 12 hours 08/16/18 0901     08/15/18 2200  vancomycin (VANCOCIN) 1,250 mg in sodium chloride 0.9 % 250 mL IVPB     1,250 mg 166.7 mL/hr over 90 Minutes Intravenous Every 24 hours 08/15/18 0940     08/14/18 0900  vancomycin (VANCOCIN) IVPB 1000 mg/200 mL premix  Status:  Discontinued     1,000 mg 200 mL/hr over 60 Minutes Intravenous Every 12 hours 08/14/18 0859 08/15/18 0940   08/13/18 0836  ceFAZolin (ANCEF) 2-4 GM/100ML-% IVPB    Note to Pharmacy:  Lissa MerlinMorgan, Michael   : cabinet override      08/13/18 0836 08/13/18 0848   08/11/18 2300  cefTRIAXone (ROCEPHIN) 1 g in sodium chloride 0.9 % 100 mL IVPB  Status:  Discontinued     1 g 200 mL/hr over 30 Minutes Intravenous Every 24 hours 08/11/18 2254 08/14/18 0842        Objective:   Vitals:   08/17/18 1133 08/17/18 1615 08/17/18 1806 08/17/18 2138  BP: (!) 83/58 102/66 93/69 (!) 96/53  Pulse: 67 71 63 70  Resp: 18 17 17 16   Temp: (!) 97.4 F (36.3 C) 97.6 F (36.4 C) 98 F (36.7 C) 98.2 F (36.8 C)  TempSrc: Oral Oral Oral Oral  SpO2: 94% 96% 96% 96%  Weight:      Height:        Wt Readings from Last 3 Encounters:  08/17/18 81.3  kg  07/31/18 93 kg  07/15/18 93 kg     Intake/Output Summary (Last 24 hours) at 08/18/2018 0937 Last data filed at 08/18/2018 0600 Gross per 24 hour  Intake 2192.23 ml  Output 4330 ml  Net -2137.77 ml   Physical exam Not in distress HEENT: Moist mucosa, supple neck Chest: Clear  bilaterally CVs: Normal S1 and S2, no murmurs GI: Soft, minimal abdominal distention with ascites, midline hernia, Pleurx catheter in place, bowel sounds present Musculoskeletal: Packing over right inner thigh following I&D, no bleeding but significant serous drainage,  improvement in erythema and swelling of the legs       Data Review:    CBC Recent Labs  Lab 08/11/18 1855 08/13/18 1458 08/16/18 0454 08/17/18 1206  WBC 8.6 14.2* 15.8* 9.7  HGB 9.3* 8.9* 8.7* 8.4*  HCT 26.8* 25.8* 24.8* 24.2*  PLT 150 121* 149* 144*  MCV 101.9* 102.4* 99.2 100.0  MCH 35.4* 35.3* 34.8* 34.7*  MCHC 34.7 34.5 35.1 34.7  RDW 15.4 14.8 14.7 15.1  LYMPHSABS 0.4*  --   --   --   MONOABS 1.9*  --   --   --   EOSABS 0.0  --   --   --   BASOSABS 0.0  --   --   --     Chemistries  Recent Labs  Lab 08/11/18 1855 08/13/18 1458 08/15/18 0543 08/16/18 0454 08/16/18 1117 08/17/18 1206 08/18/18 0552  NA 128* 125* 123* 121*  --  127* 127*  K 4.1 3.8 4.4 4.3  --  3.6 3.7  CL 96* 92* 91* 90*  --  96* 94*  CO2 30 25 24 24   --  26 26  GLUCOSE 150* 137* 135* 142*  --  113* 107*  BUN 10 14 18 19   --  17 15  CREATININE 0.94 0.97 1.31* 1.24  --  1.02 0.92  CALCIUM 7.4* 7.4* 7.4* 7.3*  --  7.4* 7.7*  AST 48*  --   --   --  30  --   --   ALT 27  --   --   --  17  --   --   ALKPHOS 153*  --   --   --  148*  --   --   BILITOT 2.6*  --   --   --  2.5*  --   --    ------------------------------------------------------------------------------------------------------------------ No results for input(s): CHOL, HDL, LDLCALC, TRIG, CHOLHDL, LDLDIRECT in the last 72 hours.  Lab Results  Component Value Date   HGBA1C 5.4 02/29/2016   ------------------------------------------------------------------------------------------------------------------ No results for input(s): TSH, T4TOTAL, T3FREE, THYROIDAB in the last 72 hours.  Invalid input(s):  FREET3 ------------------------------------------------------------------------------------------------------------------ No results for input(s): VITAMINB12, FOLATE, FERRITIN, TIBC, IRON, RETICCTPCT in the last 72 hours.  Coagulation profile Recent Labs  Lab 08/11/18 1855 08/13/18 0709 08/16/18 1117  INR 1.49 1.87 1.46    No results for input(s): DDIMER in the last 72 hours.  Cardiac Enzymes No results for input(s): CKMB, TROPONINI, MYOGLOBIN in the last 168 hours.  Invalid input(s): CK ------------------------------------------------------------------------------------------------------------------ No results found for: BNP  Inpatient Medications  Scheduled Meds: . Chlorhexidine Gluconate Cloth  6 each Topical Q0600  . cholecalciferol  1,000 Units Oral Daily  . docusate sodium  200 mg Oral BID  . enoxaparin (LOVENOX) injection  40 mg Subcutaneous Q24H  . folic acid  1 mg Oral Daily  . furosemide  40 mg Oral BID  . lactulose  20 g  Oral TID  . multivitamin with minerals  1 tablet Oral Daily  . mupirocin ointment  1 application Nasal BID  . nadolol  20 mg Oral Daily  . pantoprazole  40 mg Oral Daily  . sodium chloride flush  3 mL Intravenous Q12H  . spironolactone  50 mg Oral Daily  . thiamine  100 mg Oral Daily   Continuous Infusions: . sodium chloride 10 mL/hr at 08/17/18 0907  . sodium chloride 75 mL/hr at 08/18/18 0600  . ceFEPime (MAXIPIME) IV 1 g (08/17/18 2041)  . vancomycin 1,250 mg (08/17/18 2228)   PRN Meds:.sodium chloride, acetaminophen **OR** acetaminophen, HYDROcodone-acetaminophen, ondansetron **OR** ondansetron (ZOFRAN) IV, sodium chloride flush  Micro Results Recent Results (from the past 240 hour(s))  Gram stain     Status: None   Collection Time: 08/11/18  8:21 PM  Result Value Ref Range Status   Specimen Description PERITONEAL  Final   Special Requests NONE  Final   Gram Stain   Final    CYTOSPIN SMEAR NO ORGANISMS SEEN WBC PRESENT,  PREDOMINANTLY MONONUCLEAR Gram Stain Report Called to,Read Back By and Verified With: TURNER,C. AT 2111 ON 08/11/2018 BY EVA Performed at [facility], [address], [place], [place] [address]    Report Status 08/11/2018 FINAL  Final  Culture, body fluid-bottle     Status: None   Collection Time: 08/11/18  8:27 PM  Result Value Ref Range Status   Specimen Description PERITONEAL  Final   Special Requests   Final    BOTTLES DRAWN AEROBIC AND ANAEROBIC Blood Culture adequate volume   Culture   Final    NO GROWTH 5 DAYS Performed at [facility], [address], [place], [place] [address]    Report Status 08/16/2018 FINAL  Final  MRSA PCR Screening     Status: Abnormal   Collection Time: 08/14/18  4:20 PM  Result Value Ref Range Status   MRSA by PCR POSITIVE (A) NEGATIVE Final    Comment:        The GeneXpert MRSA Assay (FDA approved for NASAL specimens only), is one component of a comprehensive MRSA colonization surveillance program. It is not intended to diagnose MRSA infection nor to guide or monitor treatment for MRSA infections. RESULT CALLED TO, READ BACK BY AND VERIFIED WITH: B.GRACE,RN AT 1933 BY L.PITT 08/14/18   Culture, blood (routine x 2)     Status: None (Preliminary result)   Collection Time: 08/16/18 11:17 AM  Result Value Ref Range Status   Specimen Description BLOOD LEFT HAND  Final   Special Requests   Final    BOTTLES DRAWN AEROBIC AND ANAEROBIC Blood Culture adequate volume   Culture   Final    NO GROWTH 2 DAYS Performed at [facility], [address], [place], [place] [address]    Report Status PENDING  Incomplete  Culture, blood (routine x 2)     Status: None (Preliminary result)   Collection Time: 08/16/18 12:07 PM  Result Value Ref Range Status   Specimen Description BLOOD LEFT ANTECUBITAL  Final   Special Requests   Final    BOTTLES DRAWN AEROBIC AND ANAEROBIC Blood Culture results may not be optimal due to an excessive volume of blood  received in culture bottles   Culture   Final    NO GROWTH 2 DAYS Performed at [facility], [address] [place], [place] [address]    Report Status PENDING  Incomplete  Aerobic/Anaerobic Culture (surgical/deep wound)  Status: None (Preliminary result)   Collection Time: 08/17/18  9:30 AM  Result Value Ref Range Status   Specimen Description ABSCESS THIGH RIGHT  Final   Special Requests NONE  Final   Gram Stain   Final    RARE WBC PRESENT, PREDOMINANTLY PMN RARE GRAM POSITIVE COCCI Performed at [facility], [address], [place], [place] [address]    Culture PENDING  Incomplete   Report Status PENDING  Incomplete    Radiology Reports Dg Chest 2 View  Result Date: 08/11/2018 CLINICAL DATA:  Shortness of breath. Abdominal ascites from cirrhosis. EXAM: CHEST - 2 VIEW COMPARISON:  06/20/2018 FINDINGS: Low lung volumes with exaggeration of the mediastinal contours. Apparent soft tissue thickening in the right suprahilar region. Tortuosity and calcific atherosclerotic disease of the aorta. Interstitial pulmonary edema. No definite effusion. Bibasilar atelectasis. Osseous structures are without acute abnormality. Soft tissues are grossly normal. IMPRESSION: Low lung volumes with exaggeration of the mediastinal contours. Apparent soft tissue thickening in the right suprahilar region. Repeated chest radiograph in full inspiration may be considered. Interstitial pulmonary edema. Bibasilar atelectasis. Electronically Signed   By: Ted Mcalpine M.D.   On: 08/11/2018 19:56   Ir Perc Noralee Stain Perit Cath Wo Port  Result Date: 08/13/2018 CLINICAL DATA:  Recurrent ascites secondary to cirrhosis, requiring repeated large volume paracentesis. Tunneled pleural drain catheter requested. See yesterday's consultation. EXAM: TUNNELED PERITONEAL CUFFED DRAIN CATHETER PLACEMENT UNDER ULTRASOUND AND FLUOROSCOPIC GUIDANCE PARACENTESIS FLUOROSCOPY TIME:  0.1 minutes; 30 uGym2 DAP TECHNIQUE: Survey  ultrasound of the abdomen was performed and an appropriate skin entry site was selected. As antibiotic prophylaxis, cefazolin 2 g was ordered pre-procedure and administered intravenously within one hour of incision. Skin site was marked, prepped with Betadine, and draped in usual sterile fashion, and infiltrated locally with 1% lidocaine. Intravenous Fentanyl and Versed were administered as conscious sedation during continuous monitoring of the patient's level of consciousness and physiological / cardiorespiratory status by the radiology RN, with a total moderate sedation time of 13 minutes. Under real-time ultrasound guidance, an 18 gauge sheath needle was advanced into the peritoneal space. Ultrasound imaging documentation was saved. Ascites could be aspirated. An Amplatz guidewire advanced through the sheath easily. The PleurX cuffed drain catheter was tunneled from an appropriate skin entry site to the dermatotomy. The sheath was exchanged over the wire for a peel-away sheath, through which the PleurX catheter was advanced into the peritoneal space. Fluoroscopy confirmed appropriate position of the catheter. The catheter was secured externally with 0 Prolene suture. The dermatotomy was closed with Dermabond. Total of 3.8 liters clear yellow ascites were removed. The patient tolerated the procedure well, with no immediate complication. IMPRESSION: 1. Technically successful tunneled cuffed peritoneal drain catheter placement. 2. Paracentesis removing 3.8 liters ascites. Electronically Signed   By: Corlis Leak M.D.   On: 08/13/2018 09:53   [place] Rt Lower Extrem Ltd Soft Tissue Non Vascular  Result Date: 08/16/2018 CLINICAL DATA:  Cellulitis right medial thigh region with pain EXAM: ULTRASOUND RIGHT THIGH TECHNIQUE: Longitudinal and transverse images of the right thigh region were obtained. COMPARISON:  None. FINDINGS: There is diffuse soft tissue edema throughout the right medial thigh region. There is a complex  irregular fluid collection measuring 3.7 x 3.7 x 7.4 cm. No other masslike area in this region. No adenopathy. No tendon or ligamentous assessment made. IMPRESSION: Phlegmon with probable developing abscess in the medial right thigh region measuring 3.7 x 3.7 x 7.4 cm. Extensive surrounding soft tissue edema.  No other lesion evident in this area. Electronically Signed   By: Bretta Bang III M.D.   On: 08/16/2018 10:35   [place] Ascites (abdomen Limited)  Result Date: 07/19/2018 CLINICAL DATA:  Pre PleurX catheter placement evaluation of ascites. EXAM: LIMITED ABDOMEN ULTRASOUND FOR ASCITES TECHNIQUE: Limited ultrasound survey for ascites was performed in all four abdominal quadrants. COMPARISON:  07/18/2018. FINDINGS: Small to moderate amount of ascites in both lower quadrants of the abdomen. IMPRESSION: Small to moderate amount of ascites in the lower quadrants of the abdomen bilaterally. Electronically Signed   By: Beckie Salts M.D.   On: 07/19/2018 11:35   Ir Paracentesis  Result Date: 07/31/2018 INDICATION: Recurrent ascites EXAM: ULTRASOUND-GUIDED PARACENTESIS COMPARISON:  Previous paracentesis. MEDICATIONS: 10 cc 1% lidocaine. COMPLICATIONS: None immediate. TECHNIQUE: Informed written consent was obtained from the patient after a discussion of the risks, benefits and alternatives to treatment. A timeout was performed prior to the initiation of the procedure. Initial ultrasound scanning demonstrates a large amount of ascites within the right lower abdominal quadrant. The right lower abdomen was prepped and draped in the usual sterile fashion. 1% lidocaine with epinephrine was used for local anesthesia. Under direct ultrasound guidance, a 19 gauge, 7-cm, Yueh catheter was introduced. An ultrasound image was saved for documentation purposed. The paracentesis was performed. The catheter was removed and a dressing was applied. The patient tolerated the procedure well without immediate post procedural  complication. FINDINGS: A total of approximately 2.8 liters of yellow fluid was removed. IMPRESSION: Successful ultrasound-guided paracentesis yielding 2.8 liters of peritoneal fluid. Read by Robet Leu [facility] Electronically Signed   By: Judie Petit.  Shick M.D.   On: 07/31/2018 10:08    Time Spent in minutes  25   Adlene Adduci M.D on 08/18/2018 at 9:37 AM  Between 7am to 7pm - Pager - [phone]  After 7pm go to [email or web address] - password [facility]  [facility] -  Office  [phone]

## 2018-08-18 NOTE — Progress Notes (Signed)
Pharmacy Antibiotic Note  Derrick RossBarry K Oneill is a 67 y.o. male admitted on 08/11/2018 with cellulitis.  Pharmacy has been consulted for Vancomycin and Cefepime dosing.  Today is Day #5 of Vancomycin and total abx Day #8. Adding Cefepime as cellulitis not resolving. WBC down to 9.7 yesterday. Scr stable at 0.92 (CrCl 77 mL/min).   Vancomycin trough (approximately 22.5 hours after last dose) came back therapeutic at 17.   Antimicrobials this admission: 9/7 Ceftriaxone >>9/10 9/10 Vanc >>  9/12 Cefepime>>  Dose adjustments this admission: N/A  Microbiology results: 9/7 Peritoneal washings >>ngtd 9/10 MRSA PCR: positive 9/12 BCx: ngtd x2d 9/13 R-thigh abscess: mod staph aureus  Plan: Continue Vancomycin at 1250mg  IV q24h Cefepime 1gm IV q12h Will f/u micro data, renal function, and pt's clinical condition BMET at least every 72 hours while on Vanc   Height: 5\' 8"  (172.7 cm) Weight: 179 lb 3.7 oz (81.3 kg) IBW/kg (Calculated) : 68.4  Temp (24hrs), Avg:98.1 F (36.7 C), Min:97.9 F (36.6 C), Max:98.2 F (36.8 C)  Recent Labs  Lab 08/13/18 1458 08/15/18 0543 08/16/18 0454 08/17/18 1206 08/18/18 0552 08/18/18 2053  WBC 14.2*  --  15.8* 9.7  --   --   CREATININE 0.97 1.31* 1.24 1.02 0.92  --   VANCOTROUGH  --   --   --   --   --  17    Estimated Creatinine Clearance: 77.4 mL/min (by C-G formula based on SCr of 0.92 mg/dL).    No Known Allergies    Thank you for allowing pharmacy to be a part of this patient's care.  Girard CooterKimberly Perkins, PharmD Clinical Pharmacist  Pager: [phone] Phone: [phone] **Pharmacist phone directory can now be found on [email or web address] ([email or web address]).  Listed under [facility]. 08/18/2018 9:34 PM

## 2018-08-18 NOTE — Progress Notes (Signed)
1 Day Post-Op    CC: Right thigh abscess and lower extremity cellulitis.  Subjective: Dressing was taken down.  Still pretty tender as you might expect.  Some there is sanguinous drainage.  The cellulitis looks much better.  Start wet-to-dry dressings.  Objective: Vital signs in last 24 hours: Temp:  [97.2 F (36.2 C)-98.2 F (36.8 C)] 98.2 F (36.8 C) (09/13 2138) Pulse Rate:  [63-71] 70 (09/13 2138) Resp:  [15-22] 16 (09/13 2138) BP: (75-102)/(51-69) 96/53 (09/13 2138) SpO2:  [89 %-97 %] 96 % (09/13 2138) Last BM Date: 08/15/18  522 p.o. 1700 IV 2125 urine 2200 Pleurx drain. Stool x1 this a.m. Afebrile blood pressures on the low side. Sodium is 127, glucose 107, CBC yesterday was 9.7.  No CBC listed today. Intake/Output from previous day: 09/13 0701 - 09/14 0700 In: 2235.8 [P.O.:522; I.V.:1463.8; IV Piggyback:250] Out: 4330 [Urine:2125; Drains:2200; Blood:5] Intake/Output this shift: No intake/output data recorded.  General appearance: alert, cooperative and no distress Skin: Skin color, texture, turgor normal. No rashes or lesions or Open wound was seen cellulitis around it and the lower extremities much better.  We took the packing out and I/D site looks fine.  Lab Results:  Recent Labs    08/16/18 0454 08/17/18 1206  WBC 15.8* 9.7  HGB 8.7* 8.4*  HCT 24.8* 24.2*  PLT 149* 144*    BMET Recent Labs    08/17/18 1206 08/18/18 0552  NA 127* 127*  K 3.6 3.7  CL 96* 94*  CO2 26 26  GLUCOSE 113* 107*  BUN 17 15  CREATININE 1.02 0.92  CALCIUM 7.4* 7.7*   PT/INR Recent Labs    08/16/18 1117  LABPROT 17.6*  INR 1.46    Recent Labs  Lab 08/11/18 1855 08/16/18 1117  AST 48* 30  ALT 27 17  ALKPHOS 153* 148*  BILITOT 2.6* 2.5*  PROT 7.4 6.8  ALBUMIN 2.2* 1.6*     Lipase  No results found for: LIPASE   Medications: . Chlorhexidine Gluconate Cloth  6 each Topical Q0600  . cholecalciferol  1,000 Units Oral Daily  . docusate sodium  200 mg  Oral BID  . enoxaparin (LOVENOX) injection  40 mg Subcutaneous Q24H  . folic acid  1 mg Oral Daily  . furosemide  40 mg Oral BID  . lactulose  20 g Oral TID  . multivitamin with minerals  1 tablet Oral Daily  . mupirocin ointment  1 application Nasal BID  . nadolol  20 mg Oral Daily  . pantoprazole  40 mg Oral Daily  . sodium chloride flush  3 mL Intravenous Q12H  . spironolactone  50 mg Oral Daily  . thiamine  100 mg Oral Daily    ABSCESS THIGH RIGHT   Special Requests NONE   Gram Stain RARE WBC PRESENT, PREDOMINANTLY PMN  RARE GRAM POSITIVE COCCI     Assessment/Plan Alcoholic cirrhosis with refractory ascites  Placements of a right lower quadrant Pleurx catheter 08/13/2018 Dr. 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 Incision and drainage of right leg abscess, 08/17/2018 DR. Abigail Miyamotoouglas Blackman  FEN:  Regular diet;  I just made him NPO ID: [email or web address] 9/7 through 9/10.  Vancomycin 9/11 =>> day 2; Zosyn  9/12; till we have cultures Follow-up: [facility] clinic 3 weeks DVT: Lovenox  Plan: Wet-to-dry dressings twice daily.  Packed with Kerlix.  Antibiotics per Medicine.   LOS: 6 days  Sherrie George 08/18/2018 [id number]

## 2018-08-19 LAB — BASIC METABOLIC PANEL
Anion gap: 5 (ref 5–15)
BUN: 14 mg/dL (ref 8–23)
CHLORIDE: 99 mmol/L (ref 98–111)
CO2: 26 mmol/L (ref 22–32)
CREATININE: 1.05 mg/dL (ref 0.61–1.24)
Calcium: 7.8 mg/dL — ABNORMAL LOW (ref 8.9–10.3)
GFR calc Af Amer: >60 mL/min (ref >60)
GFR calc non Af Amer: >60 mL/min (ref >60)
GLUCOSE: 130 mg/dL — AB (ref 70–99)
Potassium: 3.8 mmol/L (ref 3.5–5.1)
Sodium: 130 mmol/L — ABNORMAL LOW (ref 135–145)

## 2018-08-19 NOTE — Plan of Care (Signed)

## 2018-08-19 NOTE — Progress Notes (Signed)
PROGRESS NOTE                                                                                                                                                                                                             Patient Demographics:    Derrick Oneill, is a 67 y.o. male, DOB - Jul 20, 1951, ZOX:[id number]  Admit date - 08/11/2018   Admitting Physician Briscoe Deutscher, MD  Outpatient [facility] the [facility]  LOS - 7  Outpatient Specialists: None  Chief Complaint  Patient presents with  . Ascites       Brief Narrative   67 year old male with decompensated alcoholic cirrhosis with recurrent ascites who was hospitalized at [facility] frequently requiring paracentesis with attempt to placing percutaneous peritoneal drain for home hospice follow-up and ascites fluid drain at home.  He could not get IR guided percutaneous drain placed on last 3 occasions (the first 2 times they were not enough fluid reaccumulation post large volume paracentesis and recently he was incarcerated). Patient finally got admitted on 9/7 with increasing abdominal distention with shortness of breath and increasing erythema and tenderness of bilateral lower legs. Patient had right lower quadrant peritoneal Pleurx catheter placed by IR on 9/9.  Hospital course prolonged due to development of lower extremity cellulitis and abscess.   Subjective:   Reports continuing to feel better.  Assessment  & Plan :   Alcoholic liver cirrhosis with refractory ascites After repeated scheduling finally got percutaneous drain placed by IR on 9/9.  Patient had 2 L ascites fluid drained at any pain hospital on 9/7 and then additional 2 L by radiology on 9/9 before Pleurx catheter was placed.  Fluid analysis negative for SBP.   Getting  fluid drained intermittently while in the hospital.  He will be assigned with hospice of  [place] him once discharged. Continue p.o. Lasix twice daily, nadolol and Aldactone.  No signs of encephalopathy.  Cellulitis of bilateral lower extremity (R >L) Right thigh abscess Symptoms worsened on IV Rocephin, antibiotic escalated to IV vancomycin  without improvement.    Ultrasound of the thigh shows phlegmon with possible developing abscess in the medial right thigh measuring 3.7 x 3.7 x 7.4 cm with extensive soft tissue edema.  Surgery consulted and taken to the OR for I&D of the  right thigh abscess.  No necrotizing fasciitis noted. Wound culture growing moderate staph aureus.  Continue vancomycin.  Will DC cefepime.  Transition to p.o. regimen once sensitivity available.  Hypotension Currently stable.  On low-dose Lasix.  Continue nadolol and Aldactone.  Of fluids.  Chronic hyponatremia Hypervolemic hyponatremia with decompensated cirrhosis.  Sodium of 127.  Acute pulmonary edema Secondary to cirrhosis with anasarca.  Good diuresis with IV Lasix and Aldactone.  Symptoms resolved.  Monitor I's/O  Macrocytic anemia  Associated with alcoholism.    Code Status : DNR.  Family Communication  : Mother and sister at bedside.  Disposition Plan  : Possibly home tomorrow if continues to improve and pending final sensitivity.  Likely will need home health RN for his wound care.  He will then need to follow with [facility].  Barriers For Discharge : Right thigh abscess, postoperative.  Consults  : IR, general surgery  Procedures  : Pleurx catheter placement ; I&D of right thigh abscess  DVT Prophylaxis  :  Lovenox -   Lab Results  Component Value Date   PLT 144 (L) 08/17/2018    Antibiotics  :   Anti-infectives (From admission, onward)   Start     Dose/Rate Route Frequency Ordered Stop   08/16/18 1115  vancomycin (VANCOCIN) IVPB 1000 mg/200 mL premix  Status:  Discontinued     1,000 mg 200 mL/hr over 60 Minutes Intravenous  Once 08/16/18 1104 08/16/18 1104    08/16/18 1000  ceFEPIme (MAXIPIME) 1 g in sodium chloride 0.9 % 100 mL IVPB     1 g 200 mL/hr over 30 Minutes Intravenous Every 12 hours 08/16/18 0901     08/15/18 2200  vancomycin (VANCOCIN) 1,250 mg in sodium chloride 0.9 % 250 mL IVPB     1,250 mg 166.7 mL/hr over 90 Minutes Intravenous Every 24 hours 08/15/18 0940     08/14/18 0900  vancomycin (VANCOCIN) IVPB 1000 mg/200 mL premix  Status:  Discontinued     1,000 mg 200 mL/hr over 60 Minutes Intravenous Every 12 hours 08/14/18 0859 08/15/18 0940   08/13/18 0836  ceFAZolin (ANCEF) 2-4 GM/100ML-% IVPB    Note to Pharmacy:  Lissa Merlin   : cabinet override      08/13/18 0836 08/13/18 0848   08/11/18 2300  cefTRIAXone (ROCEPHIN) 1 g in sodium chloride 0.9 % 100 mL IVPB  Status:  Discontinued     1 g 200 mL/hr over 30 Minutes Intravenous Every 24 hours 08/11/18 2254 08/14/18 0842        Objective:   Vitals:   08/17/18 2138 08/18/18 1341 08/18/18 2140 08/19/18 0432  BP: (!) 96/53 107/78 111/74 108/71  Pulse: 70 67 72 76  Resp: 16 17 16    Temp: 98.2 F (36.8 C) 97.9 F (36.6 C) 98.2 F (36.8 C) 98.1 F (36.7 C)  TempSrc: Oral Oral Oral Oral  SpO2: 96% 97% 97% 96%  Weight:      Height:        Wt Readings from Last 3 Encounters:  08/17/18 81.3 kg  07/31/18 93 kg  07/15/18 93 kg     Intake/Output Summary (Last 24 hours) at 08/19/2018 1138 Last data filed at 08/19/2018 0850 Gross per 24 hour  Intake 1061.56 ml  Output 3950 ml  Net -2888.44 ml   Physical exam Not in distress HEENT: Moist mucosa, supple neck Chest: Clear bilaterally CVS: Normal S1-S2, no murmurs GI: Soft, abdominal distention with ascites, midline hernia, Pleurx catheter in place  Musculoskeletal: Packing over right inner thigh, area of cellulitis over the right leg along with swelling has improved.       Data Review:    CBC Recent Labs  Lab 08/13/18 1458 08/16/18 0454 08/17/18 1206  WBC 14.2* 15.8* 9.7  HGB 8.9* 8.7* 8.4*  HCT 25.8*  24.8* 24.2*  PLT 121* 149* 144*  MCV 102.4* 99.2 100.0  MCH 35.3* 34.8* 34.7*  MCHC 34.5 35.1 34.7  RDW 14.8 14.7 15.1    Chemistries  Recent Labs  Lab 08/15/18 0543 08/16/18 0454 08/16/18 1117 08/17/18 1206 08/18/18 0552 08/19/18 0858  NA 123* 121*  --  127* 127* 130*  K 4.4 4.3  --  3.6 3.7 3.8  CL 91* 90*  --  96* 94* 99  CO2 24 24  --  26 26 26   GLUCOSE 135* 142*  --  113* 107* 130*  BUN 18 19  --  17 15 14   CREATININE 1.31* 1.24  --  1.02 0.92 1.05  CALCIUM 7.4* 7.3*  --  7.4* 7.7* 7.8*  AST  --   --  30  --   --   --   ALT  --   --  17  --   --   --   ALKPHOS  --   --  148*  --   --   --   BILITOT  --   --  2.5*  --   --   --    ------------------------------------------------------------------------------------------------------------------ No results for input(s): CHOL, HDL, LDLCALC, TRIG, CHOLHDL, LDLDIRECT in the last 72 hours.  Lab Results  Component Value Date   HGBA1C 5.4 02/29/2016   ------------------------------------------------------------------------------------------------------------------ No results for input(s): TSH, T4TOTAL, T3FREE, THYROIDAB in the last 72 hours.  Invalid input(s): FREET3 ------------------------------------------------------------------------------------------------------------------ No results for input(s): VITAMINB12, FOLATE, FERRITIN, TIBC, IRON, RETICCTPCT in the last 72 hours.  Coagulation profile Recent Labs  Lab 08/13/18 0709 08/16/18 1117  INR 1.87 1.46    No results for input(s): DDIMER in the last 72 hours.  Cardiac Enzymes No results for input(s): CKMB, TROPONINI, MYOGLOBIN in the last 168 hours.  Invalid input(s): CK ------------------------------------------------------------------------------------------------------------------ No results found for: BNP  Inpatient Medications  Scheduled Meds: . cholecalciferol  1,000 Units Oral Daily  . docusate sodium  200 mg Oral BID  . enoxaparin (LOVENOX)  injection  40 mg Subcutaneous Q24H  . folic acid  1 mg Oral Daily  . furosemide  40 mg Oral BID  . lactulose  20 g Oral TID  . multivitamin with minerals  1 tablet Oral Daily  . mupirocin ointment  1 application Nasal BID  . nadolol  20 mg Oral Daily  . pantoprazole  40 mg Oral Daily  . sodium chloride flush  3 mL Intravenous Q12H  . spironolactone  50 mg Oral Daily  . thiamine  100 mg Oral Daily   Continuous Infusions: . sodium chloride 10 mL/hr at 08/17/18 0907  . sodium chloride 75 mL/hr at 08/18/18 2242  . ceFEPime (MAXIPIME) IV 1 g (08/19/18 0901)  . vancomycin 1,250 mg (08/18/18 2300)   PRN Meds:.sodium chloride, acetaminophen **OR** acetaminophen, HYDROcodone-acetaminophen, ondansetron **OR** ondansetron (ZOFRAN) IV, sodium chloride flush  Micro Results Recent Results (from the past 240 hour(s))  Gram stain     Status: None   Collection Time: 08/11/18  8:21 PM  Result Value Ref Range Status   Specimen Description PERITONEAL  Final   Special Requests NONE  Final   Gram Stain   Final  CYTOSPIN SMEAR NO ORGANISMS SEEN WBC PRESENT, PREDOMINANTLY MONONUCLEAR Gram Stain Report Called to,Read Back By and Verified With: TURNER,C. AT 2111 ON 08/11/2018 BY EVA Performed at [facility], [address] [place], [place] [address]    Report Status 08/11/2018 FINAL  Final  Culture, body fluid-bottle     Status: None   Collection Time: 08/11/18  8:27 PM  Result Value Ref Range Status   Specimen Description PERITONEAL  Final   Special Requests   Final    BOTTLES DRAWN AEROBIC AND ANAEROBIC Blood Culture adequate volume   Culture   Final    NO GROWTH 5 DAYS Performed at [facility], [address] [place], [place] [address]    Report Status 08/16/2018 FINAL  Final  MRSA PCR Screening     Status: Abnormal   Collection Time: 08/14/18  4:20 PM  Result Value Ref Range Status   MRSA by PCR POSITIVE (A) NEGATIVE Final    Comment:        The GeneXpert MRSA Assay (FDA approved for  NASAL specimens only), is one component of a comprehensive MRSA colonization surveillance program. It is not intended to diagnose MRSA infection nor to guide or monitor treatment for MRSA infections. RESULT CALLED TO, READ BACK BY AND VERIFIED WITH: B.GRACE,RN AT 1933 BY L.PITT 08/14/18   Culture, blood (routine x 2)     Status: None (Preliminary result)   Collection Time: 08/16/18 11:17 AM  Result Value Ref Range Status   Specimen Description BLOOD LEFT HAND  Final   Special Requests   Final    BOTTLES DRAWN AEROBIC AND ANAEROBIC Blood Culture adequate volume   Culture   Final    NO GROWTH 2 DAYS Performed at [facility], [address] [place], [place] [address]    Report Status PENDING  Incomplete  Culture, blood (routine x 2)     Status: None (Preliminary result)   Collection Time: 08/16/18 12:07 PM  Result Value Ref Range Status   Specimen Description BLOOD LEFT ANTECUBITAL  Final   Special Requests   Final    BOTTLES DRAWN AEROBIC AND ANAEROBIC Blood Culture results may not be optimal due to an excessive volume of blood received in culture bottles   Culture   Final    NO GROWTH 2 DAYS Performed at [facility], [address] [place], [place] [address]    Report Status PENDING  Incomplete  Aerobic/Anaerobic Culture (surgical/deep wound)     Status: None (Preliminary result)   Collection Time: 08/17/18  9:30 AM  Result Value Ref Range Status   Specimen Description ABSCESS THIGH RIGHT  Final   Special Requests NONE  Final   Gram Stain   Final    RARE WBC PRESENT, PREDOMINANTLY PMN RARE GRAM POSITIVE COCCI    Culture   Final    MODERATE STAPHYLOCOCCUS AUREUS SUSCEPTIBILITIES TO FOLLOW Performed at [facility], [address] [place], [place] [address]    Report Status PENDING  Incomplete    Radiology Reports Dg Chest 2 View  Result Date: 08/11/2018 CLINICAL DATA:  Shortness of breath. Abdominal ascites from cirrhosis. EXAM: CHEST - 2 VIEW  COMPARISON:  06/20/2018 FINDINGS: Low lung volumes with exaggeration of the mediastinal contours. Apparent soft tissue thickening in the right suprahilar region. Tortuosity and calcific atherosclerotic disease of the aorta. Interstitial pulmonary edema. No definite effusion. Bibasilar atelectasis. Osseous structures are without acute abnormality. Soft tissues are grossly normal. IMPRESSION: Low lung volumes  with exaggeration of the mediastinal contours. Apparent soft tissue thickening in the right suprahilar region. Repeated chest radiograph in full inspiration may be considered. Interstitial pulmonary edema. Bibasilar atelectasis. Electronically Signed   By: Ted Mcalpine M.D.   On: 08/11/2018 19:56   Ir Perc Noralee Stain Perit Cath Wo Port  Result Date: 08/13/2018 CLINICAL DATA:  Recurrent ascites secondary to cirrhosis, requiring repeated large volume paracentesis. Tunneled pleural drain catheter requested. See yesterday's consultation. EXAM: TUNNELED PERITONEAL CUFFED DRAIN CATHETER PLACEMENT UNDER ULTRASOUND AND FLUOROSCOPIC GUIDANCE PARACENTESIS FLUOROSCOPY TIME:  0.1 minutes; 30 uGym2 DAP TECHNIQUE: Survey ultrasound of the abdomen was performed and an appropriate skin entry site was selected. As antibiotic prophylaxis, cefazolin 2 g was ordered pre-procedure and administered intravenously within one hour of incision. Skin site was marked, prepped with Betadine, and draped in usual sterile fashion, and infiltrated locally with 1% lidocaine. Intravenous Fentanyl and Versed were administered as conscious sedation during continuous monitoring of the patient's level of consciousness and physiological / cardiorespiratory status by the radiology RN, with a total moderate sedation time of 13 minutes. Under real-time ultrasound guidance, an 18 gauge sheath needle was advanced into the peritoneal space. Ultrasound imaging documentation was saved. Ascites could be aspirated. An Amplatz guidewire advanced through the  sheath easily. The PleurX cuffed drain catheter was tunneled from an appropriate skin entry site to the dermatotomy. The sheath was exchanged over the wire for a peel-away sheath, through which the PleurX catheter was advanced into the peritoneal space. Fluoroscopy confirmed appropriate position of the catheter. The catheter was secured externally with 0 Prolene suture. The dermatotomy was closed with Dermabond. Total of 3.8 liters clear yellow ascites were removed. The patient tolerated the procedure well, with no immediate complication. IMPRESSION: 1. Technically successful tunneled cuffed peritoneal drain catheter placement. 2. Paracentesis removing 3.8 liters ascites. Electronically Signed   By: Corlis Leak M.D.   On: 08/13/2018 09:53   [place] Rt Lower Extrem Ltd Soft Tissue Non Vascular  Result Date: 08/16/2018 CLINICAL DATA:  Cellulitis right medial thigh region with pain EXAM: ULTRASOUND RIGHT THIGH TECHNIQUE: Longitudinal and transverse images of the right thigh region were obtained. COMPARISON:  None. FINDINGS: There is diffuse soft tissue edema throughout the right medial thigh region. There is a complex irregular fluid collection measuring 3.7 x 3.7 x 7.4 cm. No other masslike area in this region. No adenopathy. No tendon or ligamentous assessment made. IMPRESSION: Phlegmon with probable developing abscess in the medial right thigh region measuring 3.7 x 3.7 x 7.4 cm. Extensive surrounding soft tissue edema. No other lesion evident in this area. Electronically Signed   By: Bretta Bang III M.D.   On: 08/16/2018 10:35   Ir Paracentesis  Result Date: 07/31/2018 INDICATION: Recurrent ascites EXAM: ULTRASOUND-GUIDED PARACENTESIS COMPARISON:  Previous paracentesis. MEDICATIONS: 10 cc 1% lidocaine. COMPLICATIONS: None immediate. TECHNIQUE: Informed written consent was obtained from the patient after a discussion of the risks, benefits and alternatives to treatment. A timeout was performed prior to the  initiation of the procedure. Initial ultrasound scanning demonstrates a large amount of ascites within the right lower abdominal quadrant. The right lower abdomen was prepped and draped in the usual sterile fashion. 1% lidocaine with epinephrine was used for local anesthesia. Under direct ultrasound guidance, a 19 gauge, 7-cm, Yueh catheter was introduced. An ultrasound image was saved for documentation purposed. The paracentesis was performed. The catheter was removed and a dressing was applied. The patient tolerated the procedure well without immediate post procedural complication. FINDINGS:  A total of approximately 2.8 liters of yellow fluid was removed. IMPRESSION: Successful ultrasound-guided paracentesis yielding 2.8 liters of peritoneal fluid. Read by Robet Leu [facility] Electronically Signed   By: Judie Petit.  Shick M.D.   On: 07/31/2018 10:08    Time Spent in minutes  25   Quintavia Rogstad M.D on 08/19/2018 at 11:38 AM  Between 7am to 7pm - Pager - [phone]  After 7pm go to [email or web address] - password [facility]  [facility] -  Office  [phone]

## 2018-08-19 NOTE — Progress Notes (Signed)
2 Days Post-Op   Subjective/Chief Complaint: "I feel much better." Pt doing well this AM    Objective: Vital signs in last 24 hours: Temp:  [97.9 F (36.6 C)-98.2 F (36.8 C)] 98.1 F (36.7 C) (09/15 0432) Pulse Rate:  [67-76] 76 (09/15 0432) Resp:  [16-17] 16 (09/14 2140) BP: (107-111)/(71-78) 108/71 (09/15 0432) SpO2:  [96 %-97 %] 96 % (09/15 0432) Last BM Date: 08/15/18  Intake/Output from previous day: 09/14 0701 - 09/15 0700 In: 1061.6 [I.V.:861.6; IV Piggyback:200] Out: 4775 [Urine:1775; Drains:3000] Intake/Output this shift: Total I/O In: -  Out: 450 [Urine:450]  General appearance: alert and cooperative Skin: inc c/d/i, decreasing erythema  Lab Results:  Recent Labs    08/17/18 1206  WBC 9.7  HGB 8.4*  HCT 24.2*  PLT 144*   BMET Recent Labs    08/17/18 1206 08/18/18 0552  NA 127* 127*  K 3.6 3.7  CL 96* 94*  CO2 26 26  GLUCOSE 113* 107*  BUN 17 15  CREATININE 1.02 0.92  CALCIUM 7.4* 7.7*   PT/INR Recent Labs    08/16/18 1117  LABPROT 17.6*  INR 1.46   ABG No results for input(s): PHART, HCO3 in the last 72 hours.  Invalid input(s): PCO2, PO2  Studies/Results: No results found.  Anti-infectives: Anti-infectives (From admission, onward)   Start     Dose/Rate Route Frequency Ordered Stop   08/16/18 1115  vancomycin (VANCOCIN) IVPB 1000 mg/200 mL premix  Status:  Discontinued     1,000 mg 200 mL/hr over 60 Minutes Intravenous  Once 08/16/18 1104 08/16/18 1104   08/16/18 1000  ceFEPIme (MAXIPIME) 1 g in sodium chloride 0.9 % 100 mL IVPB     1 g 200 mL/hr over 30 Minutes Intravenous Every 12 hours 08/16/18 0901     08/15/18 2200  vancomycin (VANCOCIN) 1,250 mg in sodium chloride 0.9 % 250 mL IVPB     1,250 mg 166.7 mL/hr over 90 Minutes Intravenous Every 24 hours 08/15/18 0940     08/14/18 0900  vancomycin (VANCOCIN) IVPB 1000 mg/200 mL premix  Status:  Discontinued     1,000 mg 200 mL/hr over 60 Minutes Intravenous Every 12 hours  08/14/18 0859 08/15/18 0940   08/13/18 0836  ceFAZolin (ANCEF) 2-4 GM/100ML-% IVPB    Note to Pharmacy:  Lissa MerlinMorgan, Michael   : cabinet override      08/13/18 0836 08/13/18 0848   08/11/18 2300  cefTRIAXone (ROCEPHIN) 1 g in sodium chloride 0.9 % 100 mL IVPB  Status:  Discontinued     1 g 200 mL/hr over 30 Minutes Intravenous Every 24 hours 08/11/18 2254 08/14/18 0842      Assessment/Plan: Alcoholic cirrhosis with refractory ascites  Placements of a right lower quadrant Pleurx catheter 08/13/2018 Dr. 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 Incision and drainage of right leg abscess, 08/17/2018 DR. Abigail Miyamotoouglas Blackman  FEN: Regular diet; I just made him NPO [id number]:Rocephin 9/7 through 9/10.Vancomycin 9/11 =>>day 2;Zosyn 9/12; till we have cultures Follow-up: DOW clinic 3 weeks DVT: Lovenox  Plan: Wet-to-dry dressings twice daily.  Packed with Kerlix.  Antibiotics per Medicine.    LOS: 7 days    Marigene Ehlersamirez Jr., [facility] 08/19/2018

## 2018-08-19 NOTE — Plan of Care (Signed)
  Problem: Education: Goal: Knowledge of General Education information will improve Description Including pain rating scale, medication(s)/side effects and non-pharmacologic comfort measures 08/19/2018 1327 by Darreld Mcleanox, Lianah Peed, RN Outcome: Progressing 08/19/2018 1141 by Darreld Mcleanox, Paxtyn Boyar, RN Outcome: Progressing   Problem: Health Behavior/Discharge Planning: Goal: Ability to manage health-related needs will improve 08/19/2018 1327 by Darreld Mcleanox, Makayia Duplessis, RN Outcome: Progressing 08/19/2018 1141 by Darreld Mcleanox, Chanti Golubski, RN Outcome: Progressing   Problem: Clinical Measurements: Goal: Ability to maintain clinical measurements within normal limits will improve 08/19/2018 1327 by Darreld Mcleanox, Anissa Abbs, RN Outcome: Progressing 08/19/2018 1141 by Darreld Mcleanox, Waddell Iten, RN Outcome: Progressing Goal: Will remain free from infection 08/19/2018 1327 by Darreld Mcleanox, Shakiah Wester, RN Outcome: Progressing 08/19/2018 1141 by Darreld Mcleanox, Kein Carlberg, RN Outcome: Progressing Goal: Diagnostic test results will improve 08/19/2018 1327 by Darreld Mcleanox, Breyanna Valera, RN Outcome: Progressing 08/19/2018 1141 by Darreld Mcleanox, Kadden Osterhout, RN Outcome: Progressing Goal: Respiratory complications will improve 08/19/2018 1327 by Darreld Mcleanox, Lalaine Overstreet, RN Outcome: Progressing 08/19/2018 1141 by Darreld Mcleanox, Izaan Kingbird, RN Outcome: Progressing Goal: Cardiovascular complication will be avoided 08/19/2018 1327 by Darreld Mcleanox, Shepard Keltz, RN Outcome: Progressing 08/19/2018 1141 by Darreld Mcleanox, Farryn Linares, RN Outcome: Progressing   Problem: Activity: Goal: Risk for activity intolerance will decrease 08/19/2018 1327 by Darreld Mcleanox, Blaiden Werth, RN Outcome: Progressing 08/19/2018 1141 by Darreld Mcleanox, Caide Campi, RN Outcome: Progressing   Problem: Nutrition: Goal: Adequate nutrition will be maintained 08/19/2018 1327 by Darreld Mcleanox, Ople Girgis, RN Outcome: Progressing 08/19/2018 1141 by Darreld Mcleanox, Jnya Brossard, RN Outcome: Progressing   Problem: Coping: Goal: Level of anxiety will decrease 08/19/2018 1327 by Darreld Mcleanox, Shaylee Stanislawski, RN Outcome: Progressing 08/19/2018 1141 by Darreld Mcleanox, Maclane Holloran, RN Outcome: Progressing   Problem: Elimination: Goal: Will not  experience complications related to bowel motility 08/19/2018 1327 by Darreld Mcleanox, Danarius Mcconathy, RN Outcome: Progressing 08/19/2018 1141 by Darreld Mcleanox, Aamir Mclinden, RN Outcome: Progressing Goal: Will not experience complications related to urinary retention 08/19/2018 1327 by Darreld Mcleanox, Tyjah Hai, RN Outcome: Progressing 08/19/2018 1141 by Darreld Mcleanox, Tiwatope Emmitt, RN Outcome: Progressing   Problem: Pain Managment: Goal: General experience of comfort will improve 08/19/2018 1327 by Darreld Mcleanox, Shakiah Wester, RN Outcome: Progressing 08/19/2018 1141 by Darreld Mcleanox, Gomer France, RN Outcome: Progressing   Problem: Safety: Goal: Ability to remain free from injury will improve 08/19/2018 1327 by Darreld Mcleanox, Ashlin Kreps, RN Outcome: Progressing 08/19/2018 1141 by Darreld Mcleanox, Shanard Treto, RN Outcome: Progressing   Problem: Skin Integrity: Goal: Risk for impaired skin integrity will decrease 08/19/2018 1327 by Darreld Mcleanox, Molly Maselli, RN Outcome: Progressing 08/19/2018 1141 by Darreld Mcleanox, Hagan Vanauken, RN Outcome: Progressing

## 2018-08-20 DIAGNOSIS — R627 Adult failure to thrive: Secondary | ICD-10-CM

## 2018-08-20 DIAGNOSIS — L03116 Cellulitis of left lower limb: Secondary | ICD-10-CM

## 2018-08-20 DIAGNOSIS — L03115 Cellulitis of right lower limb: Secondary | ICD-10-CM

## 2018-08-20 DIAGNOSIS — L02415 Cutaneous abscess of right lower limb: Secondary | ICD-10-CM | POA: Diagnosis present

## 2018-08-20 DIAGNOSIS — J810 Acute pulmonary edema: Secondary | ICD-10-CM | POA: Diagnosis present

## 2018-08-20 MED ORDER — DOXYCYCLINE HYCLATE 100 MG PO TABS: 100.0000 mg | ORAL_TABLET | Freq: Two times a day (BID) | ORAL | 0 refills | Status: AC

## 2018-08-20 MED ORDER — DOXYCYCLINE HYCLATE 100 MG PO TABS
100.0000 mg | ORAL_TABLET | Freq: Two times a day (BID) | ORAL | Status: DC
  Administered 2018-08-20: 100 mg via ORAL
  Filled 2018-08-20: qty 1

## 2018-08-20 MED ORDER — HYDROCODONE-ACETAMINOPHEN 5-325 MG PO TABS: 1.0000 | ORAL_TABLET | ORAL | 0 refills | Status: AC | PRN

## 2018-08-20 NOTE — Discharge Instructions (Signed)
WOUND CARE: - left thigh dressing to be changed twice daily - supplies: sterile saline, kerlix, scissors, ABD pads, tape  - remove dressing and all packing carefully, moistening with sterile saline as needed to avoid packing/internal dressing sticking to the wound. - clean edges of skin around the wound with water/gauze, making sure there is no tape debris or leakage left on skin that could cause skin irritation or breakdown. - dampen and clean kerlix with sterile saline and pack wound from wound base to skin level, making sure to take note of any possible areas of wound tracking, tunneling and packing appropriately. Wound can be packed loosely. Trim kerlix to size if a whole kerlix is not required. - cover wound with a dry ABD pad and secure with tape.  - write the date/time on the dry dressing/tape to better track when the last dressing change occurred. - apply any skin protectant/powder recommended by clinician to protect skin/skin folds. - change dressing as needed if leakage occurs, wound gets contaminated, or patient requests to shower. - patient may shower daily with wound open and following the shower the wound should be dried and a clean dressing placed.     Perioneal pleurx care Please drain the pleurx catheter every 3 days for ow and increase frequency if fluid re accumulatinig more frequently causing abdominal discomfort.

## 2018-08-20 NOTE — Discharge Summary (Signed)
Physician Discharge Summary  Derrick Oneill:[id number] DOB: October 04, 1951 DOA: 08/11/2018  PCP: [facility]  Admit date: 08/11/2018 Discharge date: 08/20/2018  Admitted From: home Disposition:  Home with hospice  Recommendations for Outpatient Follow-up:  1. Follow up with [facility] upon discharge. 2. Completes 7 more days of abx on 9/23  Home Health: none Equipment/Devices: none  Discharge Condition:guarded CODE STATUS:DNR Diet recommendation: regular    Discharge Diagnoses:  Principal Problem:   Abscess of right thigh   Active Problems:   Bilateral cellulitis of lower leg   Alcohol abuse   Alcoholic cirrhosis of liver with ascites (HCC)   Anasarca   Hyponatremia   FTT (failure to thrive) in adult   Macrocytic anemia   Pulmonary edema   Pressure injury of skin   Peritonitis (HCC)   Pulmonary edema, acute (HCC)  Brief narrative/ HPI 67 year old male with decompensated alcoholic cirrhosis with recurrent ascites who was hospitalized at [facility] frequently requiring paracentesis with attempt to placing percutaneous peritoneal drain for home hospice follow-up and ascites fluid drain at home.  He could not get IR guided percutaneous drain placed on last 3 occasions (the first 2 times they were not enough fluid reaccumulation post large volume paracentesis and recently he was incarcerated). Patient finally got admitted on 9/7 with increasing abdominal distention with shortness of breath and increasing erythema and tenderness of bilateral lower legs. Patient had right lower quadrant peritoneal Pleurx catheter placed by IR on 9/9.  Hospital course prolonged due to development of lower extremity cellulitis and abscess.   Hospital course Alcoholic liver cirrhosis with refractory ascites After repeated scheduling finally got percutaneous drain placed by IR on 9/9.  Patient had 2 L ascites fluid drained at any pain hospital on 9/7 and then  additional 2 L by radiology on 9/9 before Pleurx catheter was placed.  Fluid analysis negative for SBP.   Getting  fluid drained intermittently while in the hospital.  He will be assigned with [facility] him once discharged and get peritoneal fluid drained through pleurx every 3 days for now and then more frequently as needed. continue nadolol and lactulose.   Cellulitis of bilateral lower extremity (R >L) Right thigh abscess Symptoms worsened on IV Rocephin, antibiotic escalated to IV vancomycin  without improvement.    Ultrasound of the thigh shows phlegmon with possible developing abscess in the medial right thigh measuring 3.7 x 3.7 x 7.4 cm with extensive soft tissue edema.  Surgery consulted and taken to the OR for I&D of the right thigh abscess.  No necrotizing fasciitis noted. Improviog post op. Wound culture growing MRSA sensitive to doxy and clinda. Will d/c on doxycycline for 7 days. Would care dressing instruction and supplies provided.pain medication prescribed for short duration.   Hypotension Currently stable.  On low-dose Lasix.  Continue nadolol and Aldactone.  Of fluids.  Chronic hyponatremia Hypervolemic hyponatremia with decompensated cirrhosis.  Sodium stable at 130  Acute pulmonary edema Secondary to cirrhosis with anasarca.  Good diuresis with IV Lasix and Aldactone.  Symptoms resolved.  resume home dose tiorsemide and aldactone  Macrocytic anemia  Associated with alcoholism.     Family Communication  : d/w Mother and sister   Disposition Plan  : Home with home hospice    Consults  : IR, general surgery  Procedures  : Pleurx catheter placement ; I&D of right thigh abscess  Discharge Instructions   Allergies as of 08/20/2018   No Known Allergies  Medication List    TAKE these medications   cholecalciferol 1000 units tablet Commonly known as:  VITAMIN D Take 1,000 Units by mouth daily.   clobetasol cream 0.05 % Commonly  known as:  TEMOVATE Apply 1 application topically 2 (two) times daily as needed (Apply a thin layer as needed for psoriasis).   docusate sodium 100 MG capsule Commonly known as:  COLACE Take 200 mg by mouth 2 (two) times daily.   doxycycline 100 MG tablet Commonly known as:  VIBRA-TABS Take 1 tablet (100 mg total) by mouth every 12 (twelve) hours for 7 days.   ferrous sulfate 325 (65 FE) MG tablet Take 325 mg by mouth daily with breakfast.   folic acid 1 MG tablet Commonly known as:  FOLVITE Take 1 mg by mouth daily.   HYDROcodone-acetaminophen 5-325 MG tablet Commonly known as:  NORCO/VICODIN Take 1 tablet by mouth every 4 (four) hours as needed for moderate pain.   lactulose 10 GM/15ML solution Commonly known as:  CHRONULAC Take 20 g by mouth 3 (three) times daily.   multivitamin with minerals Tabs tablet Take 1 tablet by mouth daily.   nadolol 20 MG tablet Commonly known as:  CORGARD Take 1 tablet (20 mg total) by mouth daily.   potassium chloride SA 20 MEQ tablet Commonly known as:  K-DUR,KLOR-CON Take 20 mEq by mouth 2 (two) times daily.   spironolactone 100 MG tablet Commonly known as:  ALDACTONE Take 1 tablet (100 mg total) by mouth daily.   thiamine 100 MG tablet Take 100 mg by mouth daily.   torsemide 20 MG tablet Commonly known as:  DEMADEX Take 1 tablet (20 mg total) by mouth daily.   Vitamin D (Ergocalciferol) 50000 units Caps capsule Commonly known as:  DRISDOL Take 50,000 Units by mouth every 7 (seven) days.      Follow-up Information    [facility], [place]. Go on 09/04/2018.   Specialty:  General Surgery Why:  at 0900. Please arrive 30 minutes prior to complete paperwork. Please bring photo ID and insurance card Contact information: [address] [place] [address] [phone]       [place], [address] Follow up.   Why:  Call when discharged Contact information: [address] [place] [address] [phone]          No Known Allergies   Procedures/Studies: Dg Chest 2 View  Result Date: 08/11/2018 CLINICAL DATA:  Shortness of breath. Abdominal ascites from cirrhosis. EXAM: CHEST - 2 VIEW COMPARISON:  06/20/2018 FINDINGS: Low lung volumes with exaggeration of the mediastinal contours. Apparent soft tissue thickening in the right suprahilar region. Tortuosity and calcific atherosclerotic disease of the aorta. Interstitial pulmonary edema. No definite effusion. Bibasilar atelectasis. Osseous structures are without acute abnormality. Soft tissues are grossly normal. IMPRESSION: Low lung volumes with exaggeration of the mediastinal contours. Apparent soft tissue thickening in the right suprahilar region. Repeated chest radiograph in full inspiration may be considered. Interstitial pulmonary edema. Bibasilar atelectasis. Electronically Signed   By: Ted Mcalpine M.D.   On: 08/11/2018 19:56   Ir Perc Noralee Stain Perit Cath Wo Port  Result Date: 08/13/2018 CLINICAL DATA:  Recurrent ascites secondary to cirrhosis, requiring repeated large volume paracentesis. Tunneled pleural drain catheter requested. See yesterday's consultation. EXAM: TUNNELED PERITONEAL CUFFED DRAIN CATHETER PLACEMENT UNDER ULTRASOUND AND FLUOROSCOPIC GUIDANCE PARACENTESIS FLUOROSCOPY TIME:  0.1 minutes; 30 uGym2 DAP TECHNIQUE: Survey ultrasound of the abdomen was performed and an appropriate skin entry site  was selected. As antibiotic prophylaxis, cefazolin 2 g was ordered pre-procedure and administered intravenously within one hour of incision. Skin site was marked, prepped with Betadine, and draped in usual sterile fashion, and infiltrated locally with 1% lidocaine. Intravenous Fentanyl and Versed were administered as conscious sedation during continuous monitoring of the patient's level of consciousness and physiological / cardiorespiratory status by the radiology RN, with a total moderate sedation time of  13 minutes. Under real-time ultrasound guidance, an 18 gauge sheath needle was advanced into the peritoneal space. Ultrasound imaging documentation was saved. Ascites could be aspirated. An Amplatz guidewire advanced through the sheath easily. The PleurX cuffed drain catheter was tunneled from an appropriate skin entry site to the dermatotomy. The sheath was exchanged over the wire for a peel-away sheath, through which the PleurX catheter was advanced into the peritoneal space. Fluoroscopy confirmed appropriate position of the catheter. The catheter was secured externally with 0 Prolene suture. The dermatotomy was closed with Dermabond. Total of 3.8 liters clear yellow ascites were removed. The patient tolerated the procedure well, with no immediate complication. IMPRESSION: 1. Technically successful tunneled cuffed peritoneal drain catheter placement. 2. Paracentesis removing 3.8 liters ascites. Electronically Signed   By: Corlis Leak M.D.   On: 08/13/2018 09:53   [place] Rt Lower Extrem Ltd Soft Tissue Non Vascular  Result Date: 08/16/2018 CLINICAL DATA:  Cellulitis right medial thigh region with pain EXAM: ULTRASOUND RIGHT THIGH TECHNIQUE: Longitudinal and transverse images of the right thigh region were obtained. COMPARISON:  None. FINDINGS: There is diffuse soft tissue edema throughout the right medial thigh region. There is a complex irregular fluid collection measuring 3.7 x 3.7 x 7.4 cm. No other masslike area in this region. No adenopathy. No tendon or ligamentous assessment made. IMPRESSION: Phlegmon with probable developing abscess in the medial right thigh region measuring 3.7 x 3.7 x 7.4 cm. Extensive surrounding soft tissue edema. No other lesion evident in this area. Electronically Signed   By: Bretta Bang III M.D.   On: 08/16/2018 10:35   Ir Paracentesis  Result Date: 07/31/2018 INDICATION: Recurrent ascites EXAM: ULTRASOUND-GUIDED PARACENTESIS COMPARISON:  Previous paracentesis. MEDICATIONS:  10 cc 1% lidocaine. COMPLICATIONS: None immediate. TECHNIQUE: Informed written consent was obtained from the patient after a discussion of the risks, benefits and alternatives to treatment. A timeout was performed prior to the initiation of the procedure. Initial ultrasound scanning demonstrates a large amount of ascites within the right lower abdominal quadrant. The right lower abdomen was prepped and draped in the usual sterile fashion. 1% lidocaine with epinephrine was used for local anesthesia. Under direct ultrasound guidance, a 19 gauge, 7-cm, Yueh catheter was introduced. An ultrasound image was saved for documentation purposed. The paracentesis was performed. The catheter was removed and a dressing was applied. The patient tolerated the procedure well without immediate post procedural complication. FINDINGS: A total of approximately 2.8 liters of yellow fluid was removed. IMPRESSION: Successful ultrasound-guided paracentesis yielding 2.8 liters of peritoneal fluid. Read by Robet Leu [facility] Electronically Signed   By: Judie Petit.  Shick M.D.   On: 07/31/2018 10:08       Subjective: Feels better. Cellulitis much imrpoved  Discharge Exam: Vitals:   08/19/18 2151 08/20/18 0515  BP: (!) 91/58 (!) 98/53  Pulse: 72 75  Resp: 18   Temp: 98.5 F (36.9 C) 98.3 F (36.8 C)  SpO2: 97% 97%   Vitals:   08/19/18 0432 08/19/18 1549 08/19/18 2151 08/20/18 0515  BP: 108/71 (!) 87/58 (!) 91/58 Marland Kitchen)  98/53  Pulse: 76 71 72 75  Resp:  17 18   Temp: 98.1 F (36.7 C) 98 F (36.7 C) 98.5 F (36.9 C) 98.3 F (36.8 C)  TempSrc: Oral Oral Oral Oral  SpO2: 96% 96% 97% 97%  Weight:      Height:       Not in distress HEENT: Moist mucosa, supple neck Chest: Clear bilaterally CVS: Normal S1-S2, no murmurs GI: Soft, abdominal distention with ascites, midline hernia, Pleurx catheter in place Musculoskeletal: Packing over right inner thigh, area of cellulitis and leg swelling much improved   The results of  significant diagnostics from this hospitalization (including imaging, microbiology, ancillary and laboratory) are listed below for reference.     Microbiology: Recent Results (from the past 240 hour(s))  Gram stain     Status: None   Collection Time: 08/11/18  8:21 PM  Result Value Ref Range Status   Specimen Description PERITONEAL  Final   Special Requests NONE  Final   Gram Stain   Final    CYTOSPIN SMEAR NO ORGANISMS SEEN WBC PRESENT, PREDOMINANTLY MONONUCLEAR Gram Stain Report Called to,Read Back By and Verified With: TURNER,C. AT 2111 ON 08/11/2018 BY EVA Performed at [facility], [address], [place], [place] [address]    Report Status 08/11/2018 FINAL  Final  Culture, body fluid-bottle     Status: None   Collection Time: 08/11/18  8:27 PM  Result Value Ref Range Status   Specimen Description PERITONEAL  Final   Special Requests   Final    BOTTLES DRAWN AEROBIC AND ANAEROBIC Blood Culture adequate volume   Culture   Final    NO GROWTH 5 DAYS Performed at [facility], [address], [place], [place] [address]    Report Status 08/16/2018 FINAL  Final  MRSA PCR Screening     Status: Abnormal   Collection Time: 08/14/18  4:20 PM  Result Value Ref Range Status   MRSA by PCR POSITIVE (A) NEGATIVE Final    Comment:        The GeneXpert MRSA Assay (FDA approved for NASAL specimens only), is one component of a comprehensive MRSA colonization surveillance program. It is not intended to diagnose MRSA infection nor to guide or monitor treatment for MRSA infections. RESULT CALLED TO, READ BACK BY AND VERIFIED WITH: B.GRACE,RN AT 1933 BY L.PITT 08/14/18   Culture, blood (routine x 2)     Status: None (Preliminary result)   Collection Time: 08/16/18 11:17 AM  Result Value Ref Range Status   Specimen Description BLOOD LEFT HAND  Final   Special Requests   Final    BOTTLES DRAWN AEROBIC AND ANAEROBIC Blood Culture adequate volume   Culture   Final    NO GROWTH 4  DAYS Performed at [facility], [address], [place], [place] [address]    Report Status PENDING  Incomplete  Culture, blood (routine x 2)     Status: None (Preliminary result)   Collection Time: 08/16/18 12:07 PM  Result Value Ref Range Status   Specimen Description BLOOD LEFT ANTECUBITAL  Final   Special Requests   Final    BOTTLES DRAWN AEROBIC AND ANAEROBIC Blood Culture results may not be optimal due to an excessive volume of blood received in culture bottles   Culture   Final    NO GROWTH 4 DAYS Performed at [facility], [address] [place], [place] [address]    Report Status PENDING  Incomplete  Aerobic/Anaerobic Culture (surgical/deep wound)     Status: None (Preliminary result)   Collection Time: 08/17/18  9:30 AM  Result Value Ref Range Status   Specimen Description ABSCESS THIGH RIGHT  Final   Special Requests NONE  Final   Gram Stain   Final    RARE WBC PRESENT, PREDOMINANTLY PMN RARE GRAM POSITIVE COCCI Performed at [facility], [address] [place], [place] [address]    Culture   Final    MODERATE METHICILLIN RESISTANT STAPHYLOCOCCUS AUREUS NO ANAEROBES ISOLATED; CULTURE IN PROGRESS FOR 5 DAYS    Report Status PENDING  Incomplete   Organism ID, Bacteria METHICILLIN RESISTANT STAPHYLOCOCCUS AUREUS  Final      Susceptibility   Methicillin resistant staphylococcus aureus - MIC*    CIPROFLOXACIN >=8 RESISTANT Resistant     ERYTHROMYCIN >=8 RESISTANT Resistant     GENTAMICIN <=0.5 SENSITIVE Sensitive     OXACILLIN >=4 RESISTANT Resistant     TETRACYCLINE <=1 SENSITIVE Sensitive     VANCOMYCIN <=0.5 SENSITIVE Sensitive     TRIMETH/SULFA <=10 SENSITIVE Sensitive     CLINDAMYCIN <=0.25 SENSITIVE Sensitive     RIFAMPIN <=0.5 SENSITIVE Sensitive     Inducible Clindamycin NEGATIVE Sensitive     * MODERATE METHICILLIN RESISTANT STAPHYLOCOCCUS AUREUS     Labs: BNP (last 3 results) No results for input(s): BNP in the last 8760  hours. Basic Metabolic Panel: Recent Labs  Lab 08/15/18 0543 08/16/18 0454 08/17/18 1206 08/18/18 0552 08/19/18 0858  NA 123* 121* 127* 127* 130*  K 4.4 4.3 3.6 3.7 3.8  CL 91* 90* 96* 94* 99  CO2 24 24 26 26 26   GLUCOSE 135* 142* 113* 107* 130*  BUN 18 19 17 15 14   CREATININE 1.31* 1.24 1.02 0.92 1.05  CALCIUM 7.4* 7.3* 7.4* 7.7* 7.8*   Liver Function Tests: Recent Labs  Lab 08/16/18 1117  AST 30  ALT 17  ALKPHOS 148*  BILITOT 2.5*  PROT 6.8  ALBUMIN 1.6*   No results for input(s): LIPASE, AMYLASE in the last 168 hours. Recent Labs  Lab 08/16/18 1452  AMMONIA 56*   CBC: Recent Labs  Lab 08/13/18 1458 08/16/18 0454 08/17/18 1206  WBC 14.2* 15.8* 9.7  HGB 8.9* 8.7* 8.4*  HCT 25.8* 24.8* 24.2*  MCV 102.4* 99.2 100.0  PLT 121* 149* 144*   Cardiac Enzymes: No results for input(s): CKTOTAL, CKMB, CKMBINDEX, TROPONINI in the last 168 hours. BNP: Invalid input(s): POCBNP CBG: No results for input(s): GLUCAP in the last 168 hours. D-Dimer No results for input(s): DDIMER in the last 72 hours. Hgb A1c No results for input(s): HGBA1C in the last 72 hours. Lipid Profile No results for input(s): CHOL, HDL, LDLCALC, TRIG, CHOLHDL, LDLDIRECT in the last 72 hours. Thyroid function studies No results for input(s): TSH, T4TOTAL, T3FREE, THYROIDAB in the last 72 hours.  Invalid input(s): FREET3 Anemia work up No results for input(s): VITAMINB12, FOLATE, FERRITIN, TIBC, IRON, RETICCTPCT in the last 72 hours. Urinalysis    Component Value Date/Time   COLORURINE YELLOW 06/22/2018 0131   APPEARANCEUR CLEAR 06/22/2018 0131   LABSPEC 1.005 06/22/2018 0131   PHURINE 5.0 06/22/2018 0131   GLUCOSEU NEGATIVE 06/22/2018 0131   HGBUR MODERATE (A) 06/22/2018 0131   BILIRUBINUR NEGATIVE 06/22/2018 0131   KETONESUR NEGATIVE 06/22/2018 0131   PROTEINUR NEGATIVE 06/22/2018 0131   NITRITE NEGATIVE 06/22/2018 0131   LEUKOCYTESUR NEGATIVE 06/22/2018 0131   Sepsis  Labs Invalid input(s): PROCALCITONIN,  WBC,  LACTICIDVEN Microbiology Recent  Results (from the past 240 hour(s))  Gram stain     Status: None   Collection Time: 08/11/18  8:21 PM  Result Value Ref Range Status   Specimen Description PERITONEAL  Final   Special Requests NONE  Final   Gram Stain   Final    CYTOSPIN SMEAR NO ORGANISMS SEEN WBC PRESENT, PREDOMINANTLY MONONUCLEAR Gram Stain Report Called to,Read Back By and Verified With: TURNER,C. AT 2111 ON 08/11/2018 BY EVA Performed at [facility], [address], [place], [place] [address]    Report Status 08/11/2018 FINAL  Final  Culture, body fluid-bottle     Status: None   Collection Time: 08/11/18  8:27 PM  Result Value Ref Range Status   Specimen Description PERITONEAL  Final   Special Requests   Final    BOTTLES DRAWN AEROBIC AND ANAEROBIC Blood Culture adequate volume   Culture   Final    NO GROWTH 5 DAYS Performed at [facility], [address] [place], [place] [address]    Report Status 08/16/2018 FINAL  Final  MRSA PCR Screening     Status: Abnormal   Collection Time: 08/14/18  4:20 PM  Result Value Ref Range Status   MRSA by PCR POSITIVE (A) NEGATIVE Final    Comment:        The GeneXpert MRSA Assay (FDA approved for NASAL specimens only), is one component of a comprehensive MRSA colonization surveillance program. It is not intended to diagnose MRSA infection nor to guide or monitor treatment for MRSA infections. RESULT CALLED TO, READ BACK BY AND VERIFIED WITH: B.GRACE,RN AT 1933 BY L.PITT 08/14/18   Culture, blood (routine x 2)     Status: None (Preliminary result)   Collection Time: 08/16/18 11:17 AM  Result Value Ref Range Status   Specimen Description BLOOD LEFT HAND  Final   Special Requests   Final    BOTTLES DRAWN AEROBIC AND ANAEROBIC Blood Culture adequate volume   Culture   Final    NO GROWTH 4 DAYS Performed at [facility], [address], [place], [place] [address]    Report Status  PENDING  Incomplete  Culture, blood (routine x 2)     Status: None (Preliminary result)   Collection Time: 08/16/18 12:07 PM  Result Value Ref Range Status   Specimen Description BLOOD LEFT ANTECUBITAL  Final   Special Requests   Final    BOTTLES DRAWN AEROBIC AND ANAEROBIC Blood Culture results may not be optimal due to an excessive volume of blood received in culture bottles   Culture   Final    NO GROWTH 4 DAYS Performed at [facility], [address], [place], [place] [address]    Report Status PENDING  Incomplete  Aerobic/Anaerobic Culture (surgical/deep wound)     Status: None (Preliminary result)   Collection Time: 08/17/18  9:30 AM  Result Value Ref Range Status   Specimen Description ABSCESS THIGH RIGHT  Final   Special Requests NONE  Final   Gram Stain   Final    RARE WBC PRESENT, PREDOMINANTLY PMN RARE GRAM POSITIVE COCCI Performed at [facility], [address], [place], [place] [address]    Culture   Final    MODERATE METHICILLIN RESISTANT STAPHYLOCOCCUS AUREUS NO ANAEROBES ISOLATED; CULTURE IN PROGRESS FOR 5 DAYS    Report Status PENDING  Incomplete   Organism ID, Bacteria METHICILLIN RESISTANT STAPHYLOCOCCUS AUREUS  Final      Susceptibility   Methicillin resistant  staphylococcus aureus - MIC*    CIPROFLOXACIN >=8 RESISTANT Resistant     ERYTHROMYCIN >=8 RESISTANT Resistant     GENTAMICIN <=0.5 SENSITIVE Sensitive     OXACILLIN >=4 RESISTANT Resistant     TETRACYCLINE <=1 SENSITIVE Sensitive     VANCOMYCIN <=0.5 SENSITIVE Sensitive     TRIMETH/SULFA <=10 SENSITIVE Sensitive     CLINDAMYCIN <=0.25 SENSITIVE Sensitive     RIFAMPIN <=0.5 SENSITIVE Sensitive     Inducible Clindamycin NEGATIVE Sensitive     * MODERATE METHICILLIN RESISTANT STAPHYLOCOCCUS AUREUS     Time coordinating discharge: 35 minutes  SIGNED:   Eddie North, MD  [facility] 08/20/2018, 12:36 PM Pager   If 7PM-7AM, please contact  night-coverage [email or web address] Password [email or web address]

## 2018-08-20 NOTE — Care Management Note (Addendum)
Case Management Note  Patient Details  Name: Derrick RossBarry K Oneill MRN: [id number] Date of Birth: 07/10/1951  Subjective/Objective:                    Action/Plan:[facility]. DC summary faxed to her.  Discussed discharge planning with patient at bedside. Patient now has wet to dry dressing change to left thigh and Pleurx catheter.   Patient states bedside nurses have taught him how to do wet to dry dressing changes already and how to drain pleurx cath . Bedside nurse will order a new box of pleurx catheters for patient to take with him at discharge.  Patient aware he cannot have home health and hospice at the same time. He has had both at different times in the past. He has a friend [name] who can have with dressing changes and pleurx catheter. He prefers hospice over home health. [facility] . Cassandra requesting discharging MD to put in discharge summary wet to dry dressing change to left thigh BID and Pleurx cath drain/ change daily.  Patient requesting cab home. SW unable to provide cab voucher to [place] . NCM can arrange [facility] transport home , but patient would receive a bill. Patient aware and states he will call his sister to come pick him up.  Expected Discharge Date:                  Expected Discharge Plan:  Home w Hospice Care  In-House Referral:     Discharge planning Services  CM Consult  Post Acute Care Choice:    Choice offered to:  Patient  DME Arranged:    DME Agency:     HH Arranged:  RN HH [facility]  [facility]  Status of Service:  In process, will continue to follow  If discussed at Long Length of Stay Meetings, dates discussed:    Additional Comments:  [name], Sachit Gilman Marie, RN 08/20/2018, 12:21 PM

## 2018-08-20 NOTE — Progress Notes (Signed)
[facility]/Trauma Progress Note  3 Days Post-Op   Assessment/Plan Alcoholic cirrhosis with refractory ascites  RLQ Pleurx catheter placed 08/13/2018 Dr. 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 - S/P I&D of right leg abscess, 08/17/2018 Dr. Magnus Ivan  FEN: Regular diet ZO:XWRUEAVW 9/7-9/10.Vancomycin 9/11-09/16; Doxy 09/16>> Follow-up: [facility]3weeks DVT: Lovenox  Plan: Wet-to-dry dressings twice daily. Antibiotics per Medicine. Okay for discharge from a surgical standpoint. Pt thinks he has a neighbor that can change his dressing. I discussed this with Heather case Production designer, theatre/television/film. Ideally the person changing his dressings would come and do a dressing change with the nurse prior to pt's discharge.    LOS: 8 days    Subjective: CC: S/P L upper thigh I&D  Minimal pain at rest. Pain with dressing changes. Pt states he has a male friend that lives close by that he will pay to do his dressing changes. Pt lives alone.   Objective: Vital signs in last 24 hours: Temp:  [98 F (36.7 C)-98.5 F (36.9 C)] 98.3 F (36.8 C) (09/16 0515) Pulse Rate:  [71-75] 75 (09/16 0515) Resp:  [17-18] 18 (09/15 2151) BP: (87-98)/(53-58) 98/53 (09/16 0515) SpO2:  [96 %-97 %] 97 % (09/16 0515) Last BM Date: 08/15/18  Intake/Output from previous day: 09/15 0701 - 09/16 0700 In: 2079.7 [I.V.:959.9; IV Piggyback:1119.8] Out: 4700 [Urine:1750; Drains:2950] Intake/Output this shift: No intake/output data recorded.  PE: Gen:  Alert, NAD, pleasant, cooperative Pulm:  Rate and effort normal Abd: distended, RLQ PD cath GU: wound is well appearing, no purulent drainage, repacked (see below) Extremities: edema of LLE Skin: no rashes noted, warm and dry      Anti-infectives: Anti-infectives (From admission, onward)   Start     Dose/Rate Route Frequency  Ordered Stop   08/20/18 1000  doxycycline (VIBRA-TABS) tablet 100 mg     100 mg Oral Every 12 hours 08/20/18 0720     08/16/18 1115  vancomycin (VANCOCIN) IVPB 1000 mg/200 mL premix  Status:  Discontinued     1,000 mg 200 mL/hr over 60 Minutes Intravenous  Once 08/16/18 1104 08/16/18 1104   08/16/18 1000  ceFEPIme (MAXIPIME) 1 g in sodium chloride 0.9 % 100 mL IVPB  Status:  Discontinued     1 g 200 mL/hr over 30 Minutes Intravenous Every 12 hours 08/16/18 0901 08/20/18 0720   08/15/18 2200  vancomycin (VANCOCIN) 1,250 mg in sodium chloride 0.9 % 250 mL IVPB  Status:  Discontinued     1,250 mg 166.7 mL/hr over 90 Minutes Intravenous Every 24 hours 08/15/18 0940 08/20/18 0720   08/14/18 0900  vancomycin (VANCOCIN) IVPB 1000 mg/200 mL premix  Status:  Discontinued     1,000 mg 200 mL/hr over 60 Minutes Intravenous Every 12 hours 08/14/18 0859 08/15/18 0940   08/13/18 0836  ceFAZolin (ANCEF) 2-4 GM/100ML-% IVPB    Note to Pharmacy:  Lissa Merlin   : cabinet override      08/13/18 0836 08/13/18 0848   08/11/18 2300  cefTRIAXone (ROCEPHIN) 1 g in sodium chloride 0.9 % 100 mL IVPB  Status:  Discontinued     1 g 200 mL/hr over 30 Minutes Intravenous Every 24 hours 08/11/18 2254 08/14/18 0842      Lab Results:  Recent Labs    08/17/18 1206  WBC 9.7  HGB 8.4*  HCT 24.2*  PLT 144*   BMET Recent Labs    08/18/18 0552 08/19/18 0981  NA 127* 130*  K 3.7 3.8  CL 94* 99  CO2 26 26  GLUCOSE 107* 130*  BUN 15 14  CREATININE 0.92 1.05  CALCIUM 7.7* 7.8*   PT/INR No results for input(s): LABPROT, INR in the last 72 hours. CMP     Component Value Date/Time   NA 130 (L) 08/19/2018 0858   K 3.8 08/19/2018 0858   CL 99 08/19/2018 0858   CO2 26 08/19/2018 0858   GLUCOSE 130 (H) 08/19/2018 0858   BUN 14 08/19/2018 0858   CREATININE 1.05 08/19/2018 0858   CALCIUM 7.8 (L) 08/19/2018 0858   PROT 6.8 08/16/2018 1117   ALBUMIN 1.6 (L) 08/16/2018 1117   AST 30 08/16/2018 1117   ALT  17 08/16/2018 1117   ALKPHOS 148 (H) 08/16/2018 1117   BILITOT 2.5 (H) 08/16/2018 1117   GFRNONAA >60 08/19/2018 0858   GFRAA >60 08/19/2018 0858   Lipase  No results found for: LIPASE  Studies/Results: No results found.    Jerre SimonJessica L Hridhaan Yohn , [facility] 08/20/2018, 8:26 AM  Pager: [phone] Mon-Wed, Friday 7:00am-4:30pm Thurs 7am-11:30am  Consults: [phone]

## 2018-08-21 LAB — CULTURE, BLOOD (ROUTINE X 2)
CULTURE: NO GROWTH
CULTURE: NO GROWTH
Special Requests: ADEQUATE

## 2018-08-22 LAB — AEROBIC/ANAEROBIC CULTURE W GRAM STAIN (SURGICAL/DEEP WOUND)

## 2018-08-22 LAB — AEROBIC/ANAEROBIC CULTURE (SURGICAL/DEEP WOUND)

## 2018-10-05 DEATH — deceased

## 2019-11-27 IMAGING — US US ABDOMEN LIMITED
1 series · 2 of 2 positions shown · non-contrast
Comparison: none

INDICATION: Ascites.

[Series 1: us abdomen limited · 0.22mm/px · 2 of 2 slices shown]
[im 1/2]
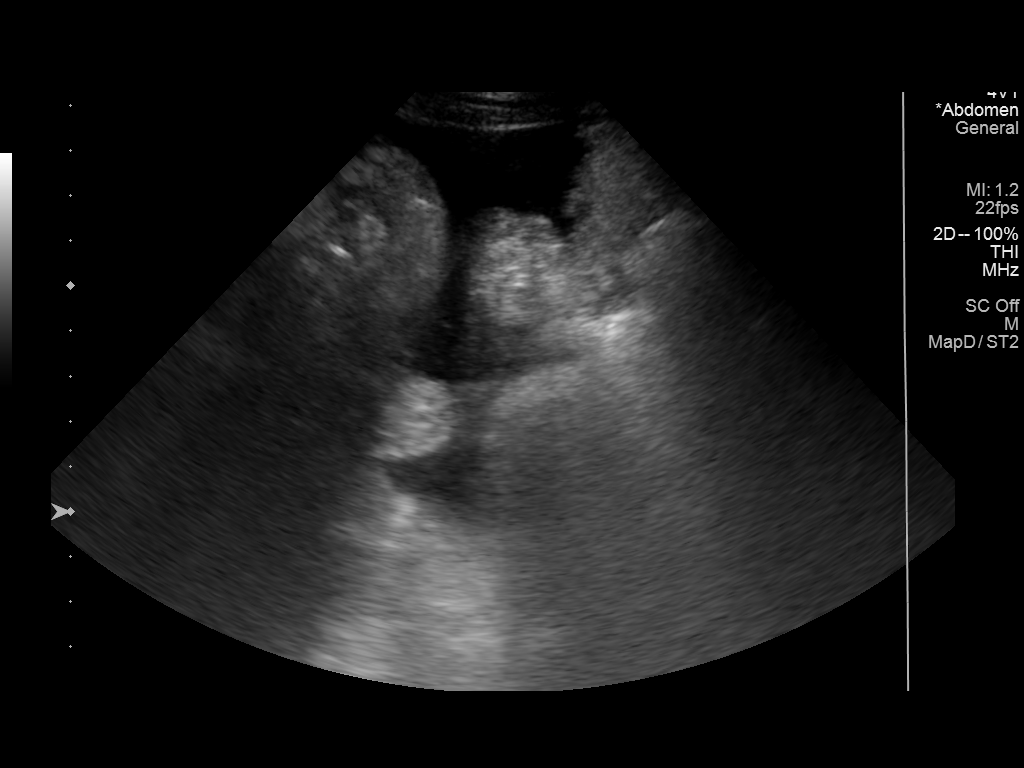
[im 2/2]
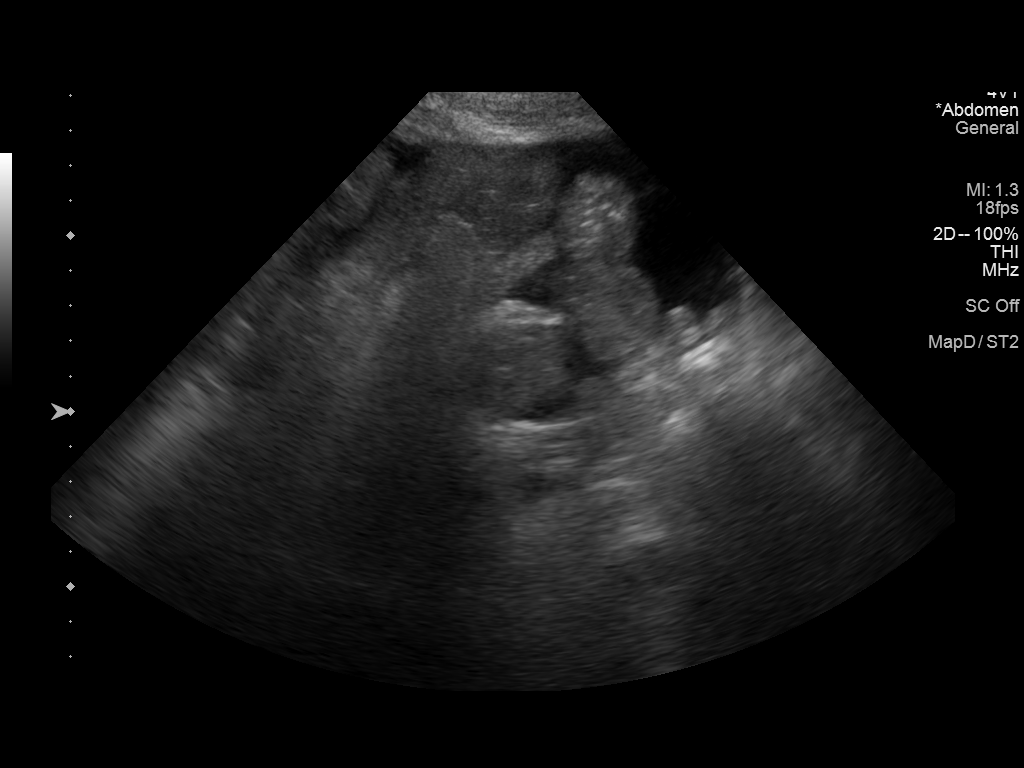

[2 of 2 positions shown; findings below may reference images not displayed]

EXAM:
ULTRASOUND GUIDED PARACENTESIS

MEDICATIONS:
None.

ANESTHESIA/SEDATION:
None.

COMPLICATIONS:
None..

PROCEDURE:
Ultrasound was performed and revealed only a tiny amount of ascites.
Paracentesis not performed at this time due to small amount of
intra-abdominal fluid. Repeat attempt can be obtained as needed.
FINDINGS: A paracentesis not performed due to low volume of fluid.
IMPRESSION: Paracentesis not performed due to low volume of fluid. Follow-up
exam can be obtained as needed.

## 2019-12-18 IMAGING — US US ABDOMEN LIMITED
1 series · 5 of 5 positions shown · non-contrast
Comparison: 05/30/2018

CLINICAL DATA: Ascites, had 8 L withdrawn by paracentesis 2 days
ago

EXAM:
LIMITED ABDOMEN ULTRASOUND FOR ASCITES
TECHNIQUE: Limited ultrasound survey for ascites was performed in all four
abdominal quadrants.

[Series 1: us abdomen limited · 0.25mm/px · 5 of 5 slices shown]
[im 1/5]
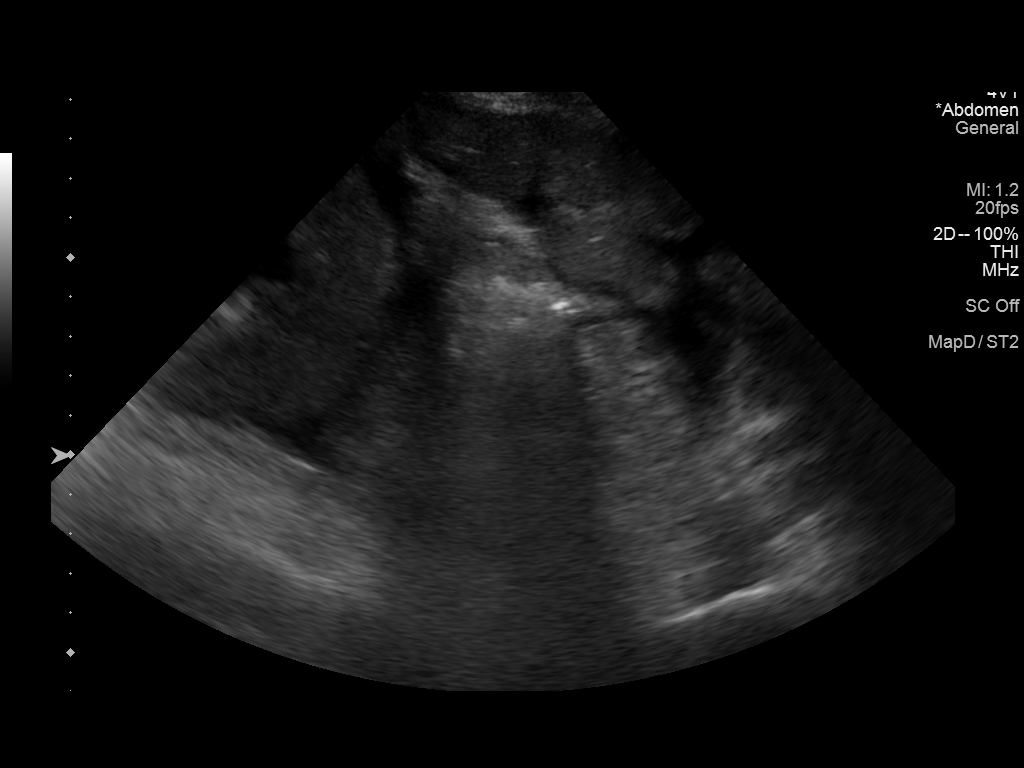
[im 2/5]
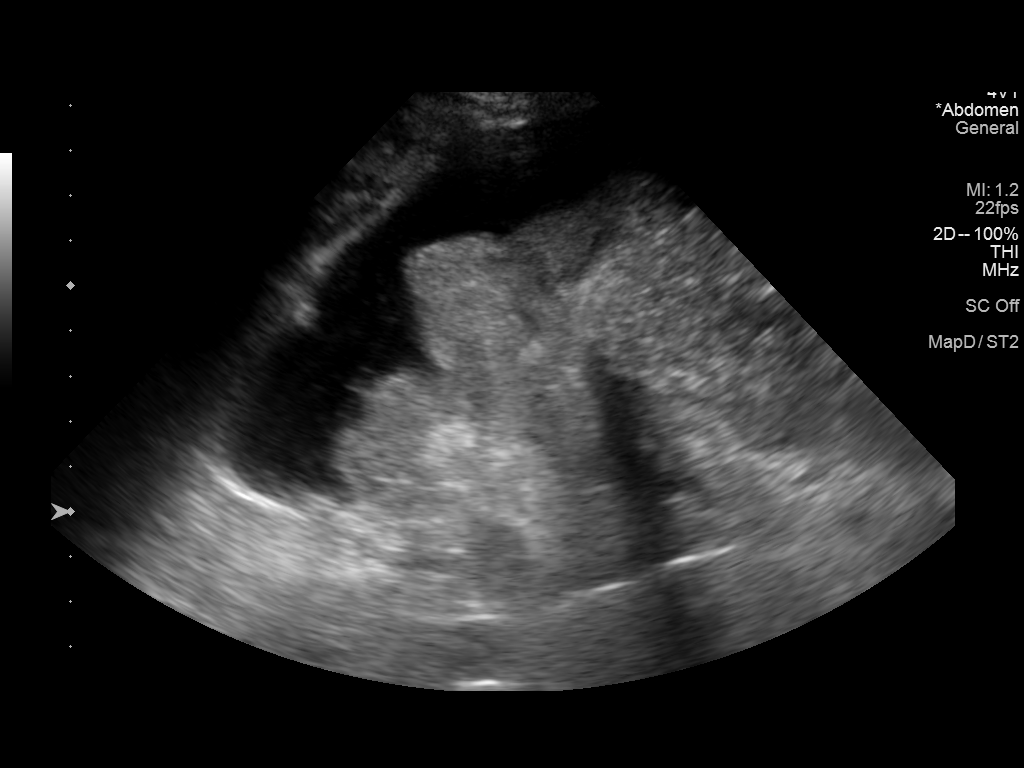
[im 3/5]
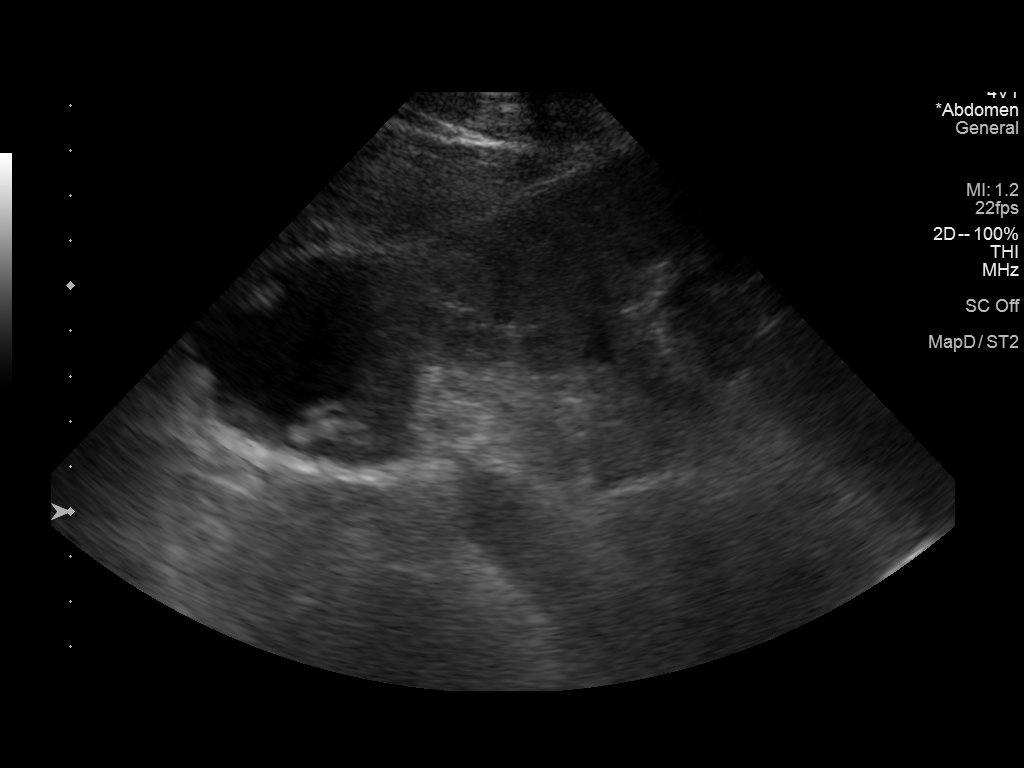
[im 4/5]
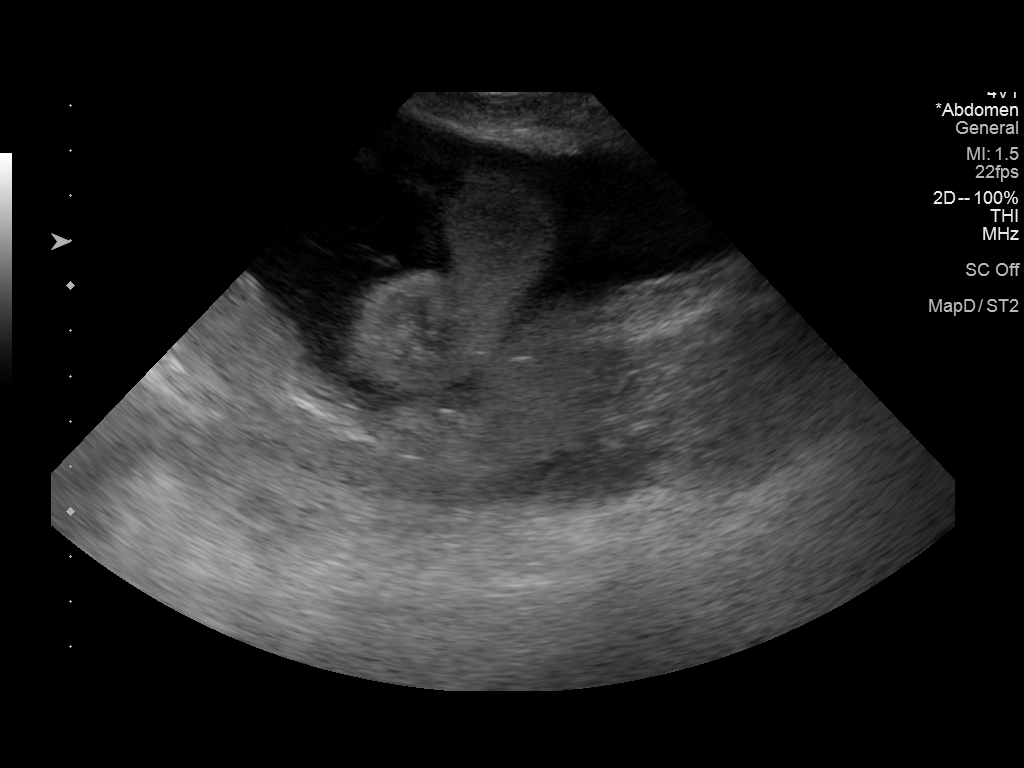
[im 5/5]
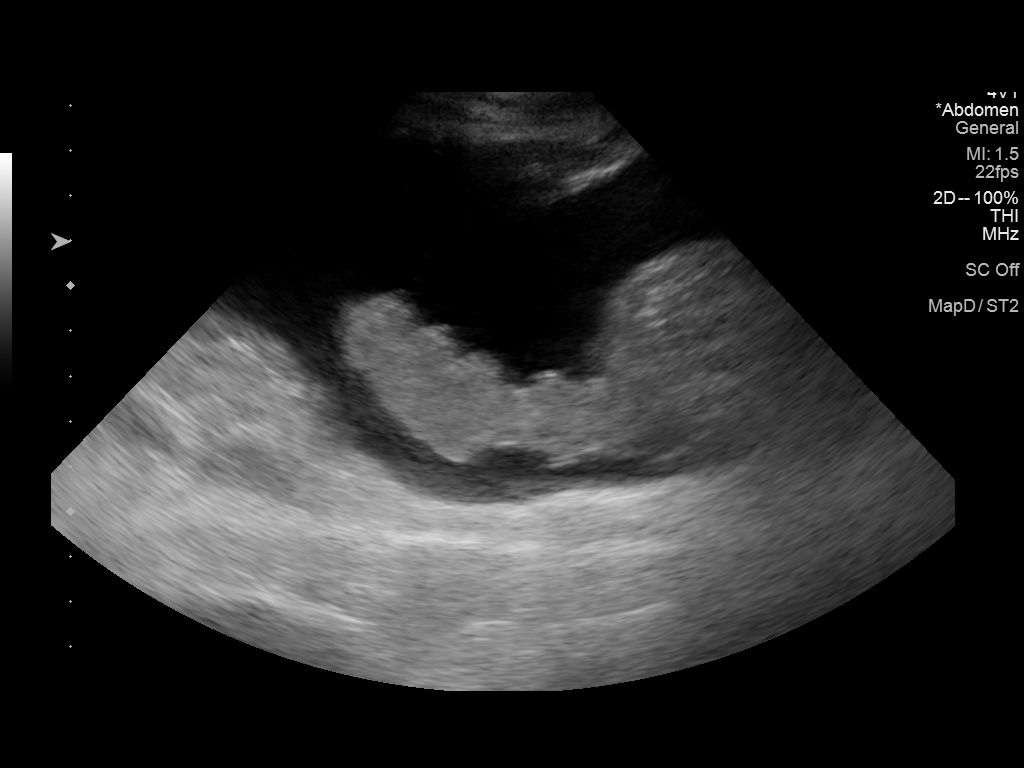

[5 of 5 positions shown; findings below may reference images not displayed]

FINDINGS: Small amount of ascites is seen throughout the abdomen.

This is variable in position with changes in patient position.

Overall, the amount of fluid present is significantly less than was
seen on 05/30/2018.

Volume of fluid present is felt insufficient for expected
significant therapeutic benefit from paracentesis and paracentesis
is not performed at this time.
IMPRESSION: Scattered low volume ascites.

Paracentesis not performed at this time.

## 2020-01-10 IMAGING — DX DG LUMBAR SPINE COMPLETE 4+V
5 series · 5 of 5 positions shown · non-contrast
Comparison: MR lumbar spine September 09, 2013

CLINICAL DATA: Status post fall with back pain.

EXAM:
LUMBAR SPINE - COMPLETE 4+ VIEW

[l-spine obl (1 of 2)]
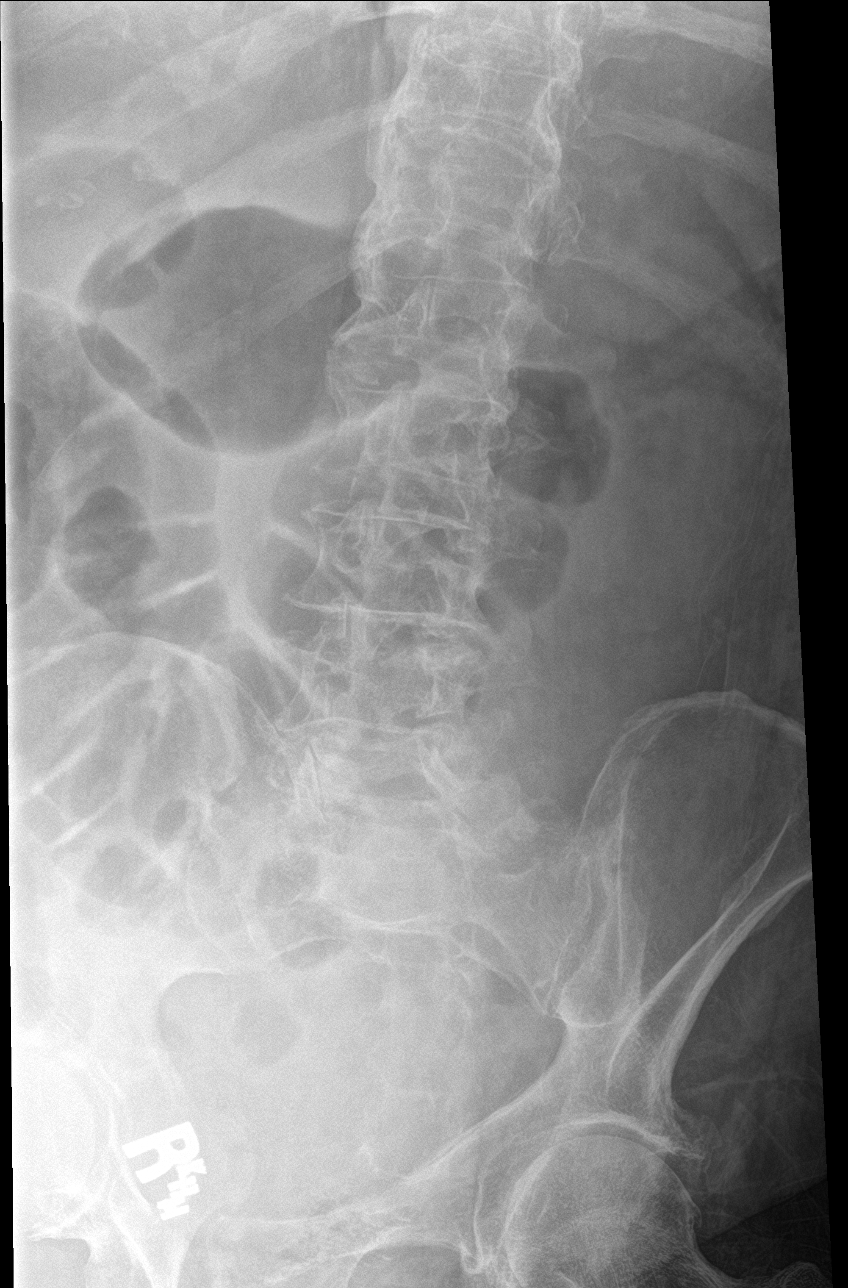

[l-spine obl (2 of 2)]
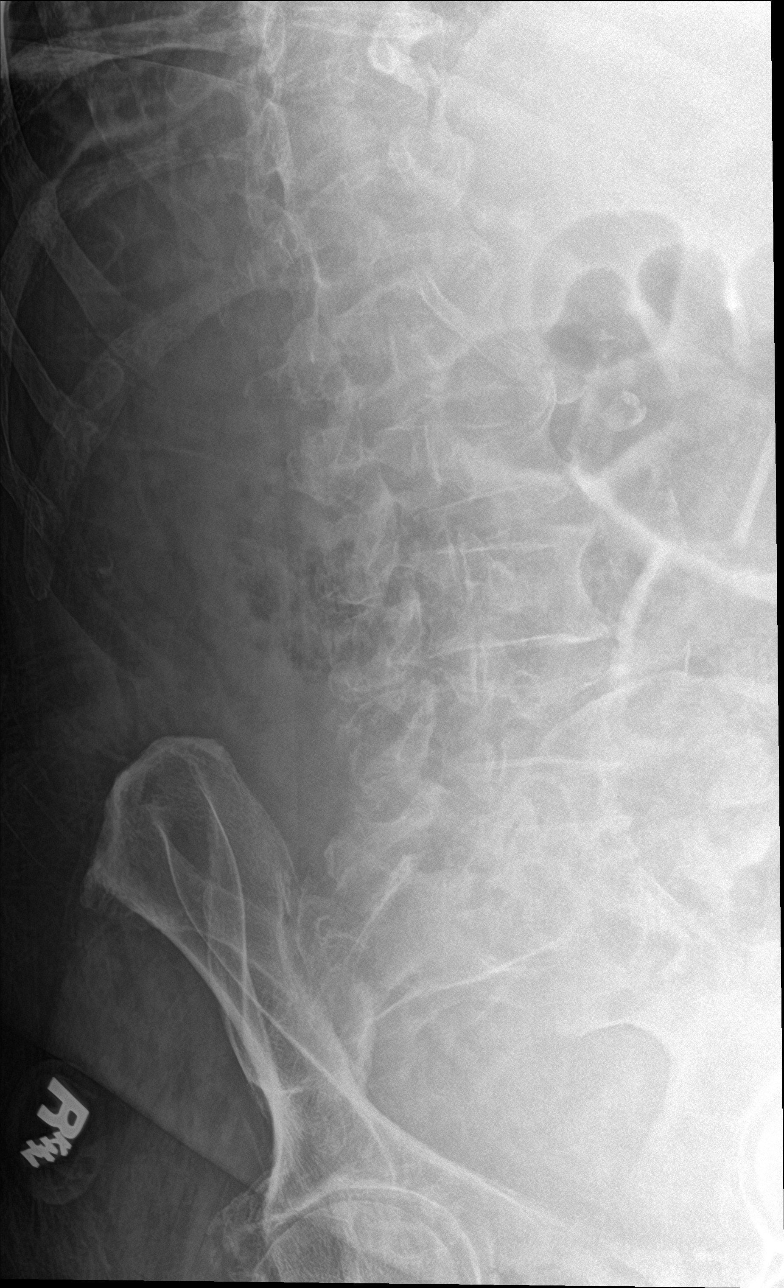

[l-spine lat]
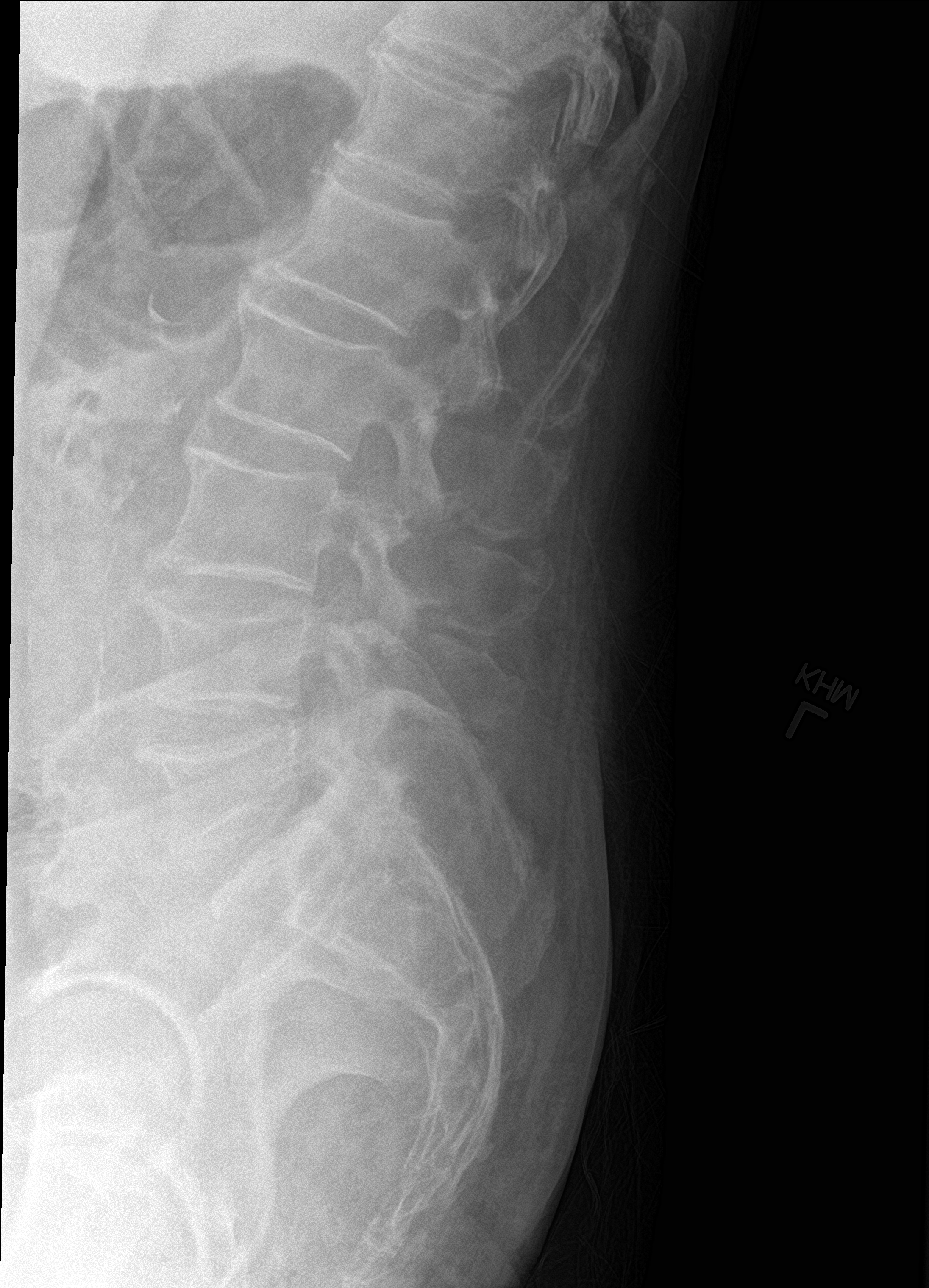

[l-spine spot]
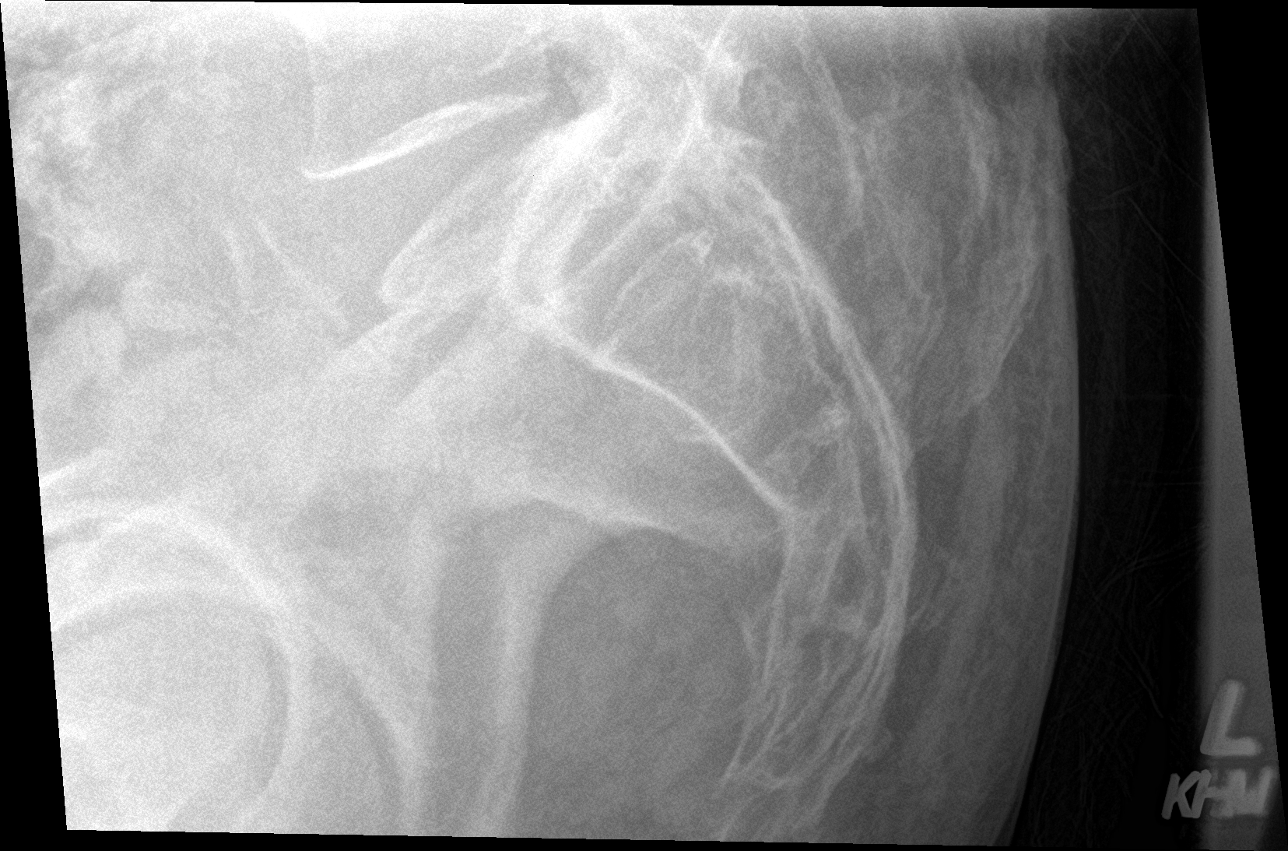

[l-spine ap]
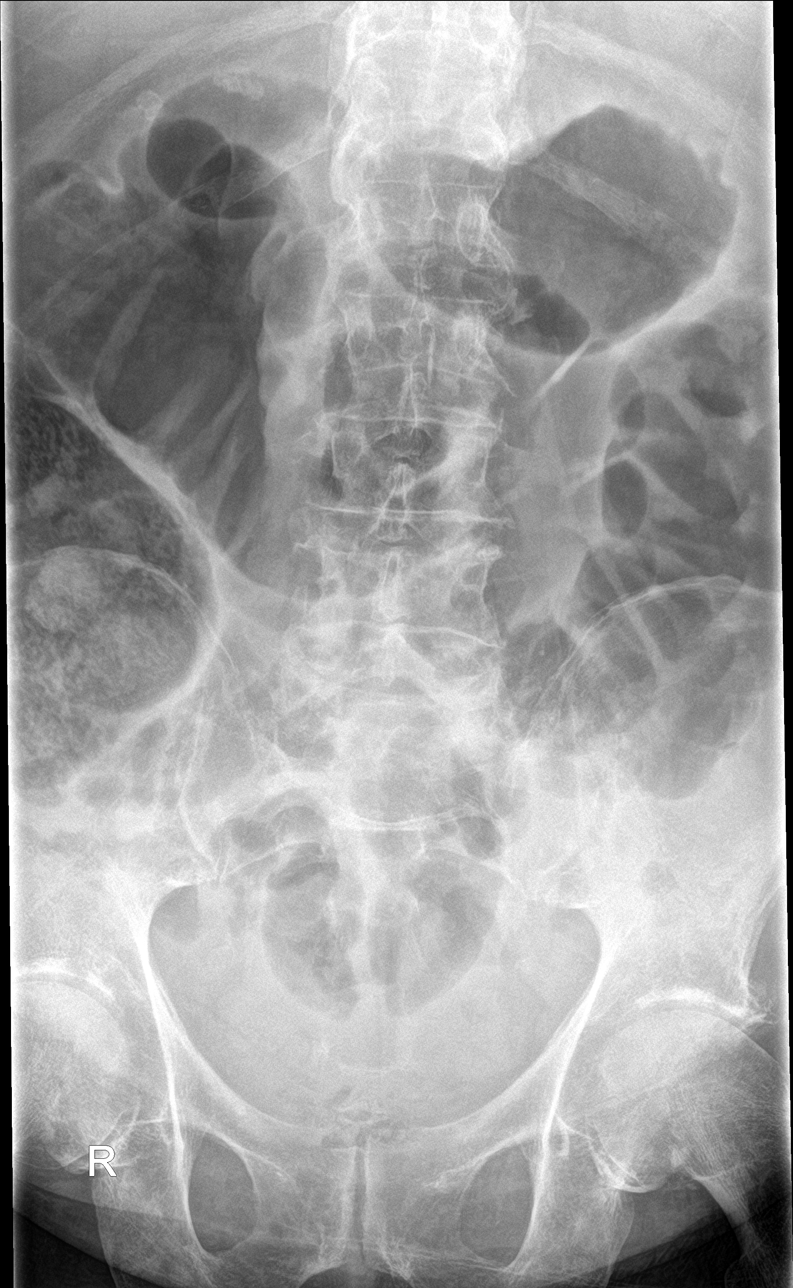

[5 of 5 positions shown; findings below may reference images not displayed]

FINDINGS: There is no evidence of lumbar spine fracture. Mild chronic
compression deformity of L4 and L5 are noted. Alignment is normal.
There are degenerative joint changes of the spine with narrowed
joint space and osteophyte formation.
IMPRESSION: No acute fracture or dislocation.

## 2020-01-11 IMAGING — US US PARACENTESIS
1 series · 4 of 4 positions shown · non-contrast
Comparison: none

INDICATION: Cirrhosis.  Ascites.

[Series 1: us paracentesis · 0.25mm/px · 4 of 4 slices shown]
[im 1/4]
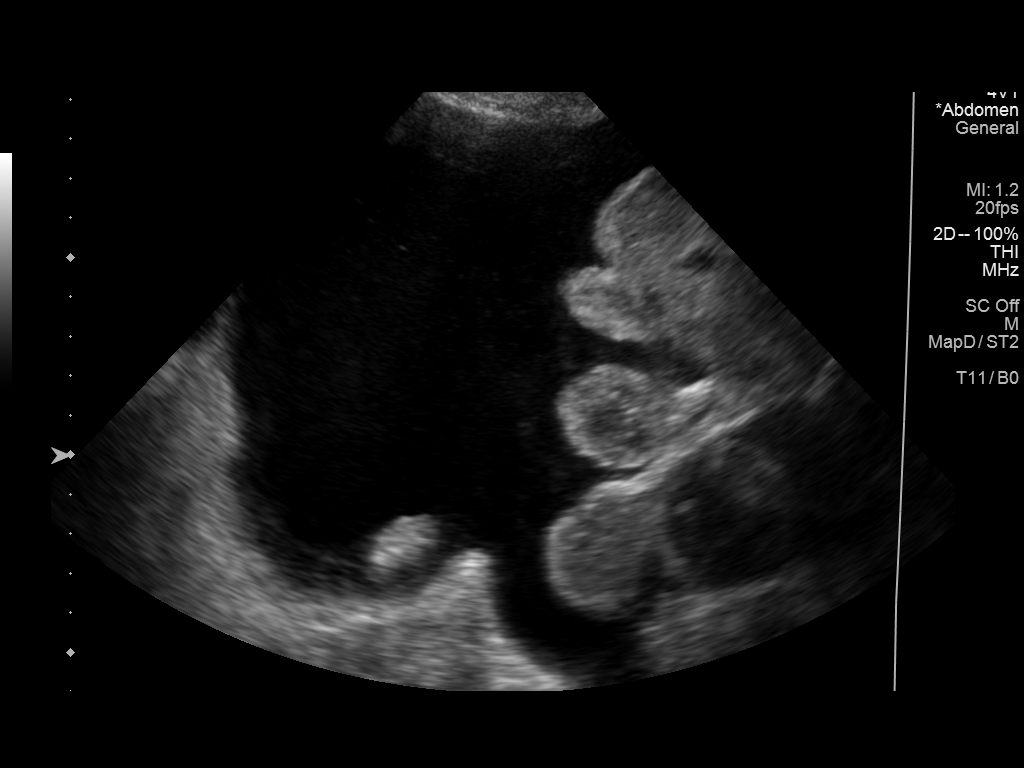
[im 2/4]
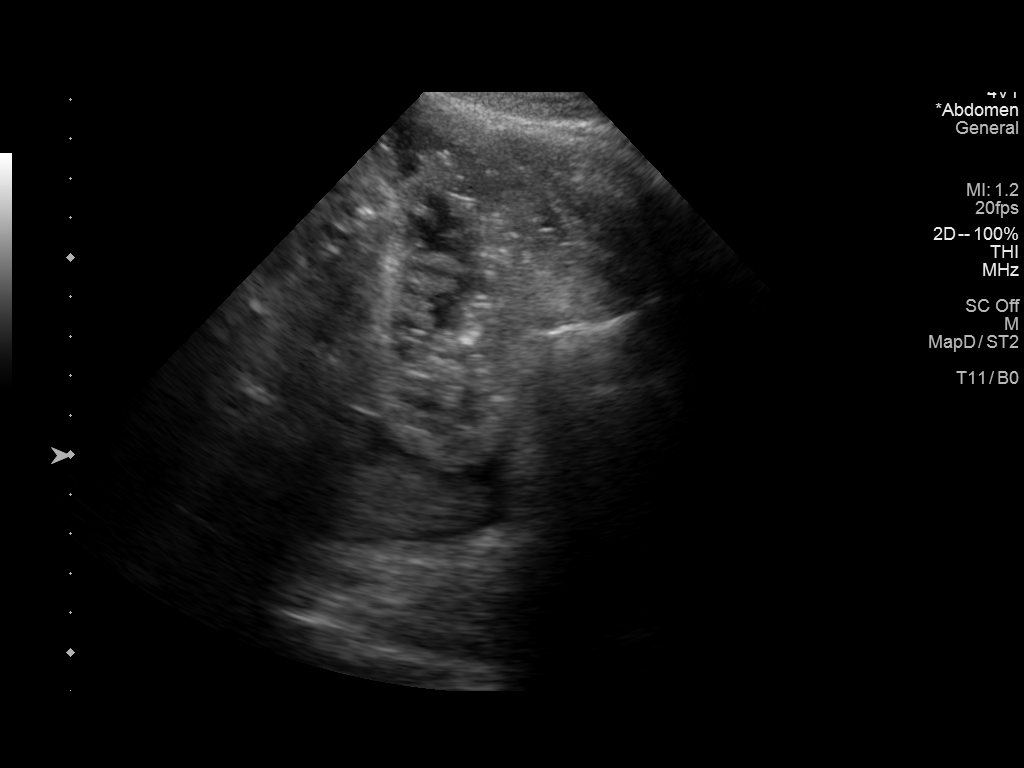
[im 3/4]
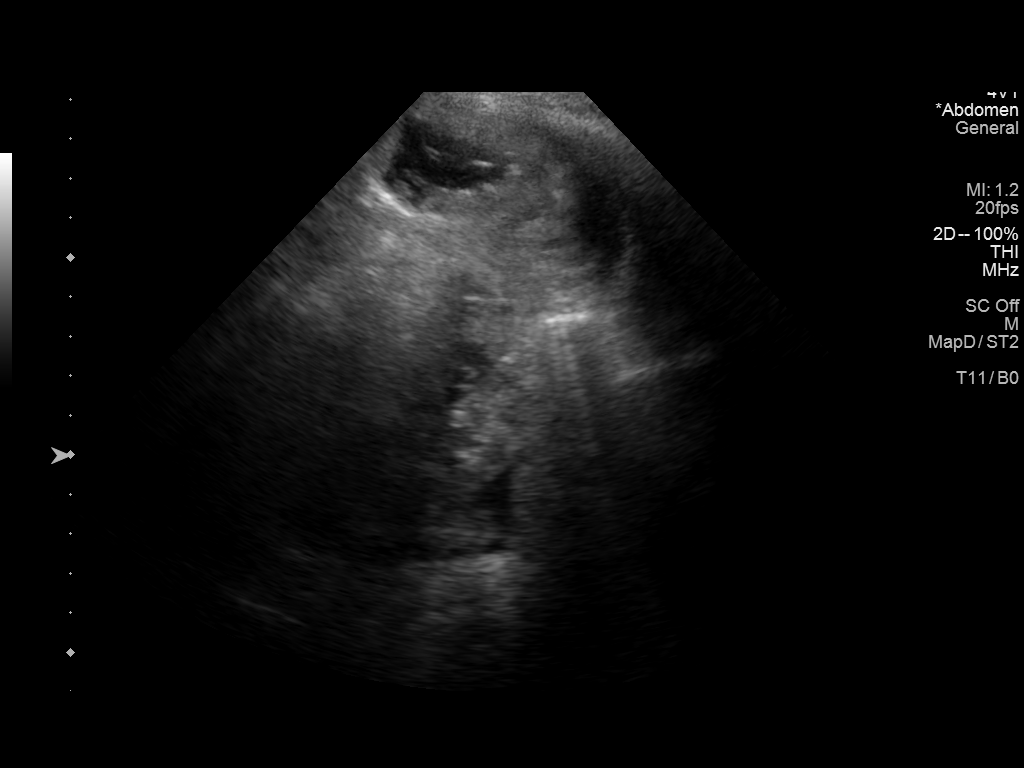
[im 4/4]
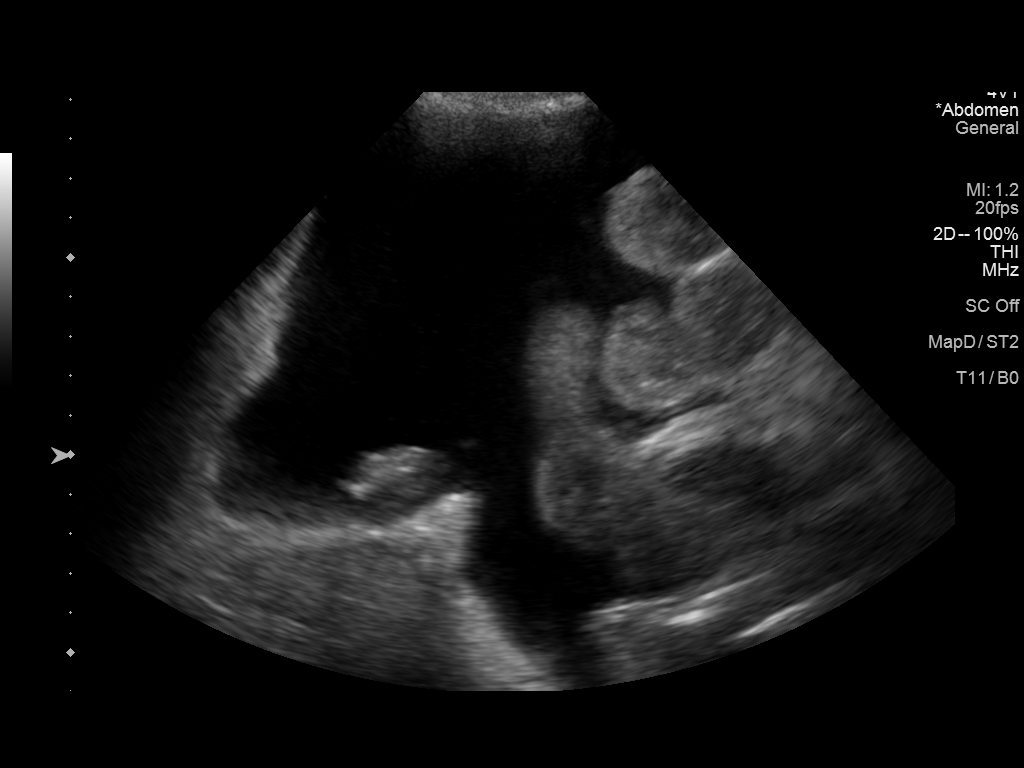

[4 of 4 positions shown; findings below may reference images not displayed]

EXAM:
ULTRASOUND GUIDED RIGHT ABDOMINAL PARACENTESIS

MEDICATIONS:
None.

COMPLICATIONS:
None immediate.

PROCEDURE:
Informed written consent was obtained from the patient after a
discussion of the risks, benefits and alternatives to treatment. A
timeout was performed prior to the initiation of the procedure.

Initial ultrasound scanning demonstrates a moderate amount of
ascites within the right upper abdominal quadrant. The right abdomen
was prepped and draped in the usual sterile fashion. 1% lidocaine
with epinephrine was used for local anesthesia.

Following this, a 19 gauge, 7-cm, Yueh catheter was introduced. An
ultrasound image was saved for documentation purposes. The
paracentesis was performed. The catheter was removed and a dressing
was applied. The patient tolerated the procedure well without
immediate post procedural complication.
FINDINGS: A total of approximately 4.3 L of yellow fluid was removed.
IMPRESSION: Successful ultrasound-guided paracentesis yielding 4.3 liters of
peritoneal fluid.

## 2020-01-14 IMAGING — US US ABDOMEN LIMITED
1 series · 6 of 6 positions shown · non-contrast
Comparison: 07/18/2018.

CLINICAL DATA: Pre PleurX catheter placement evaluation of ascites.

EXAM:
LIMITED ABDOMEN ULTRASOUND FOR ASCITES
TECHNIQUE: Limited ultrasound survey for ascites was performed in all four
abdominal quadrants.

[Series 1: us abdomen limited · 0.25mm/px · 6 of 6 slices shown]
[im 1/6]
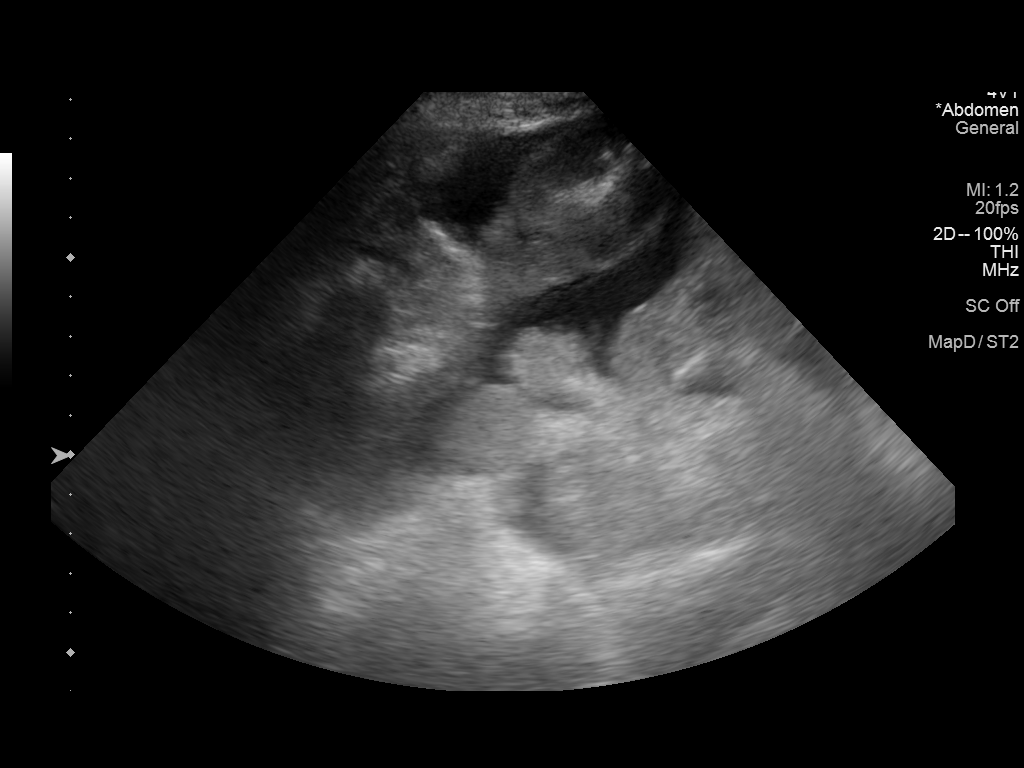
[im 2/6]
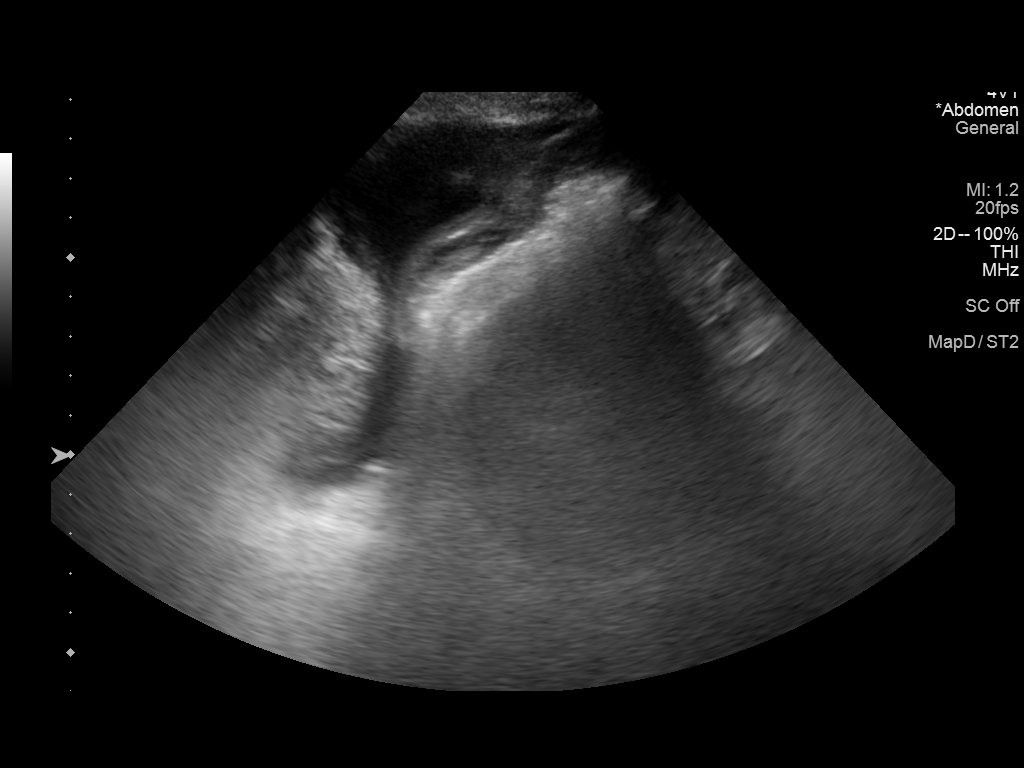
[im 3/6]
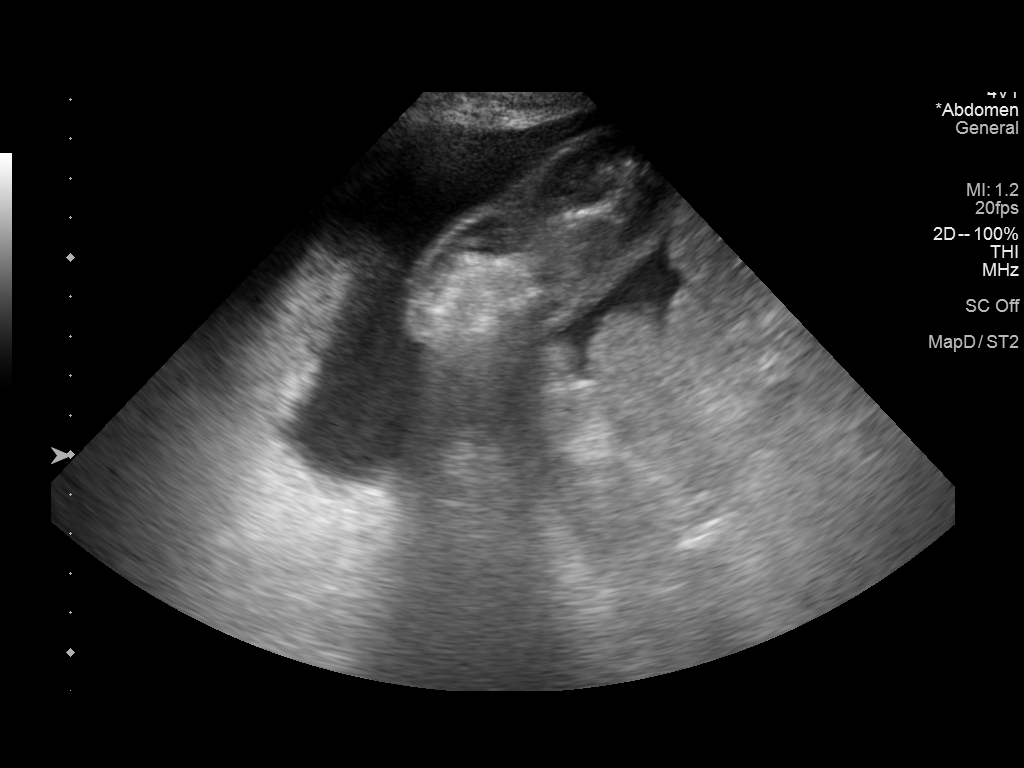
[im 4/6]
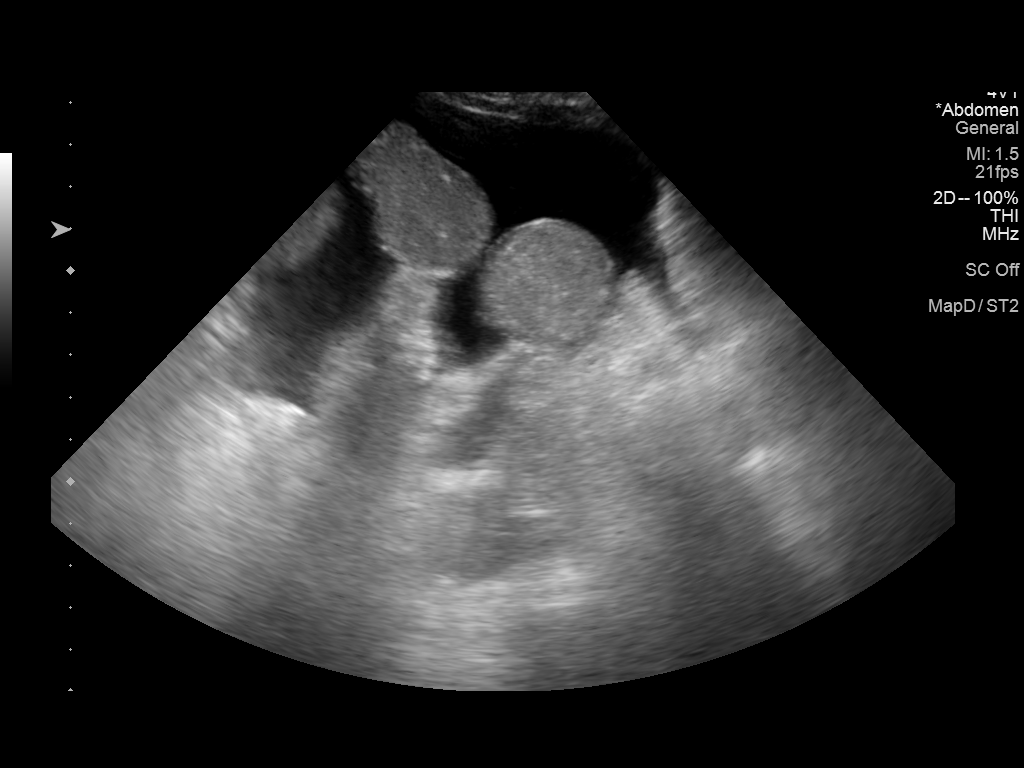
[im 5/6]
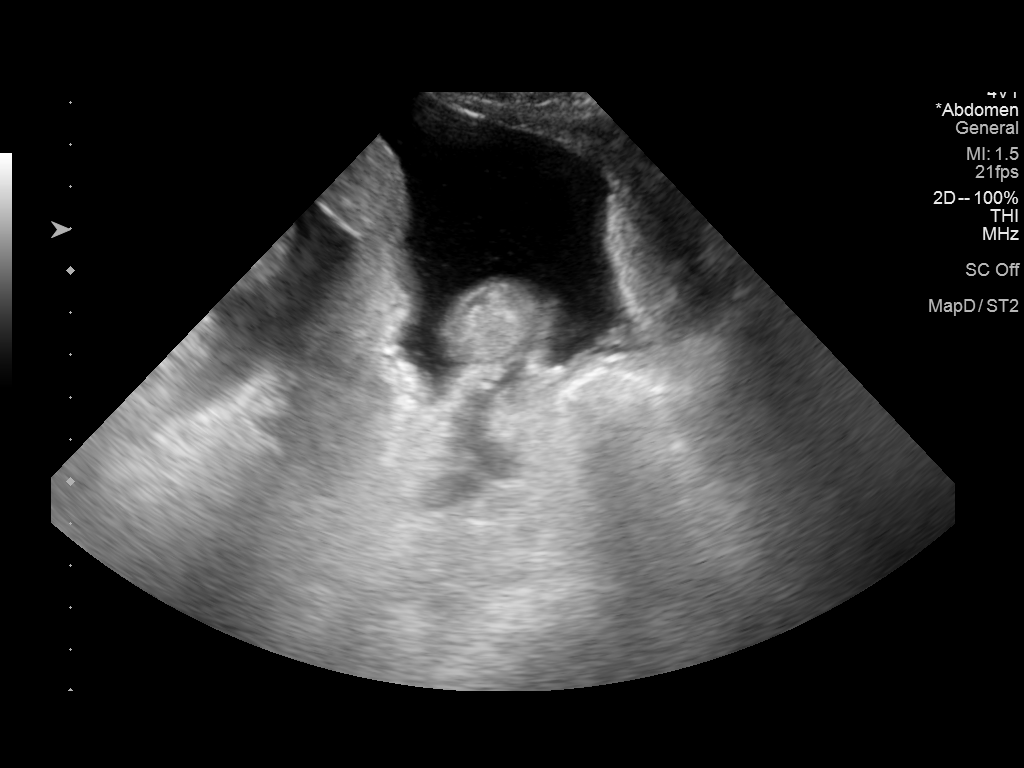
[im 6/6]
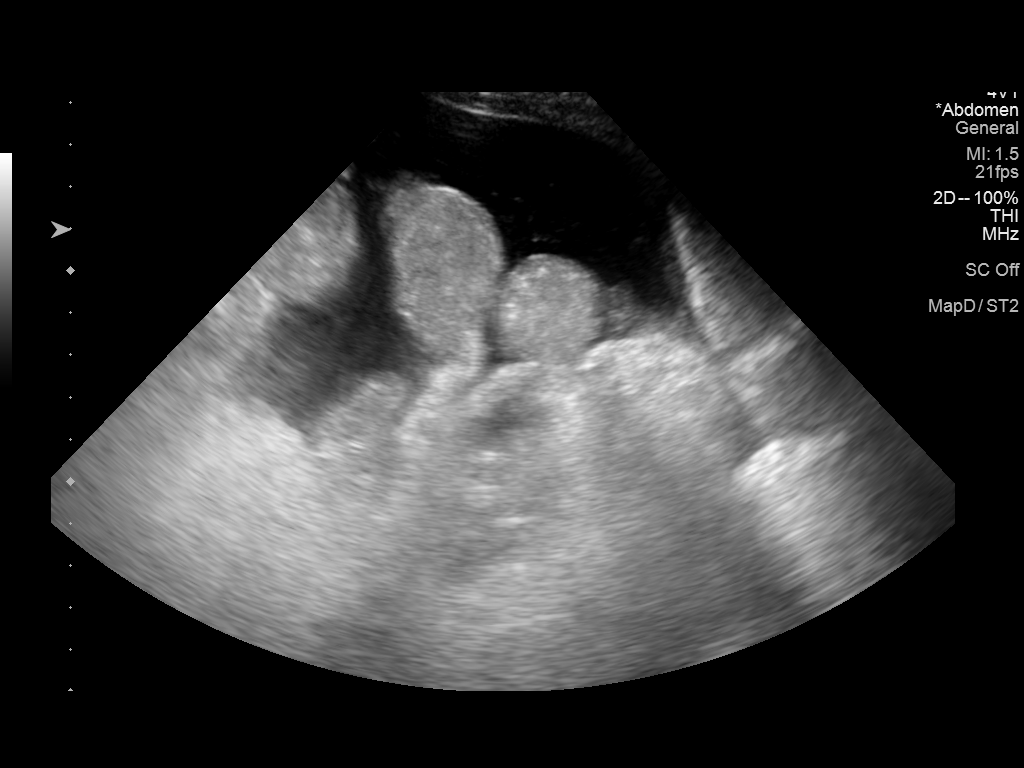

[6 of 6 positions shown; findings below may reference images not displayed]

FINDINGS: Small to moderate amount of ascites in both lower quadrants of the
abdomen.
IMPRESSION: Small to moderate amount of ascites in the lower quadrants of the
abdomen bilaterally.
